# Patient Record
Sex: Female | Born: 1945
Health system: Southern US, Community
[De-identification: ages and names within clinical notes are randomized; demographics above are authoritative.]

## PROBLEM LIST (undated history)

## (undated) DIAGNOSIS — K5790 Diverticulosis of intestine, part unspecified, without perforation or abscess without bleeding: Secondary | ICD-10-CM

## (undated) DIAGNOSIS — M1990 Unspecified osteoarthritis, unspecified site: Secondary | ICD-10-CM

## (undated) DIAGNOSIS — I10 Essential (primary) hypertension: Secondary | ICD-10-CM

## (undated) DIAGNOSIS — E785 Hyperlipidemia, unspecified: Secondary | ICD-10-CM

## (undated) DIAGNOSIS — Z8719 Personal history of other diseases of the digestive system: Secondary | ICD-10-CM

## (undated) DIAGNOSIS — K219 Gastro-esophageal reflux disease without esophagitis: Secondary | ICD-10-CM

## (undated) DIAGNOSIS — R0600 Dyspnea, unspecified: Secondary | ICD-10-CM

## (undated) HISTORY — PX: CERVICAL DISC SURGERY: SHX588

## (undated) HISTORY — PX: ORBITAL FRACTURE SURGERY: SHX725

## (undated) HISTORY — PX: ABDOMINAL HYSTERECTOMY: SHX81

## (undated) HISTORY — DX: Essential (primary) hypertension: I10

## (undated) HISTORY — PX: DILATION AND CURETTAGE OF UTERUS: SHX78

## (undated) HISTORY — PX: EYE SURGERY: SHX253

## (undated) HISTORY — DX: Unspecified osteoarthritis, unspecified site: M19.90

---

## 1998-01-25 ENCOUNTER — Other Ambulatory Visit: Admission: RE | Admit: 1998-01-25 | Discharge: 1998-01-25 | Payer: Self-pay | Admitting: Obstetrics and Gynecology

## 1998-03-20 ENCOUNTER — Ambulatory Visit (HOSPITAL_COMMUNITY): Admission: RE | Admit: 1998-03-20 | Discharge: 1998-03-20 | Payer: Self-pay | Admitting: Obstetrics and Gynecology

## 1999-02-04 ENCOUNTER — Observation Stay (HOSPITAL_COMMUNITY): Admission: EM | Admit: 1999-02-04 | Discharge: 1999-02-05 | Payer: Self-pay | Admitting: Emergency Medicine

## 1999-02-04 ENCOUNTER — Encounter: Payer: Self-pay | Admitting: Gastroenterology

## 1999-02-05 ENCOUNTER — Encounter: Payer: Self-pay | Admitting: Gastroenterology

## 1999-03-27 ENCOUNTER — Encounter: Payer: Self-pay | Admitting: Gastroenterology

## 1999-03-27 ENCOUNTER — Ambulatory Visit (HOSPITAL_COMMUNITY): Admission: RE | Admit: 1999-03-27 | Discharge: 1999-03-27 | Payer: Self-pay | Admitting: Gastroenterology

## 1999-07-28 ENCOUNTER — Emergency Department (HOSPITAL_COMMUNITY): Admission: EM | Admit: 1999-07-28 | Discharge: 1999-07-28 | Payer: Self-pay | Admitting: Emergency Medicine

## 1999-07-28 ENCOUNTER — Encounter: Payer: Self-pay | Admitting: Emergency Medicine

## 2000-07-07 ENCOUNTER — Encounter: Payer: Self-pay | Admitting: Emergency Medicine

## 2000-07-07 ENCOUNTER — Emergency Department (HOSPITAL_COMMUNITY): Admission: EM | Admit: 2000-07-07 | Discharge: 2000-07-08 | Payer: Self-pay | Admitting: Emergency Medicine

## 2000-07-08 ENCOUNTER — Encounter: Payer: Self-pay | Admitting: Surgery

## 2000-07-08 ENCOUNTER — Ambulatory Visit (HOSPITAL_COMMUNITY): Admission: RE | Admit: 2000-07-08 | Discharge: 2000-07-08 | Payer: Self-pay | Admitting: Surgery

## 2000-10-15 ENCOUNTER — Other Ambulatory Visit: Admission: RE | Admit: 2000-10-15 | Discharge: 2000-10-15 | Payer: Self-pay | Admitting: Obstetrics and Gynecology

## 2000-12-21 ENCOUNTER — Encounter: Payer: Self-pay | Admitting: Gastroenterology

## 2002-11-15 ENCOUNTER — Other Ambulatory Visit: Admission: RE | Admit: 2002-11-15 | Discharge: 2002-11-15 | Payer: Self-pay | Admitting: Obstetrics and Gynecology

## 2003-01-04 ENCOUNTER — Ambulatory Visit (HOSPITAL_COMMUNITY): Admission: RE | Admit: 2003-01-04 | Discharge: 2003-01-04 | Payer: Self-pay | Admitting: Gastroenterology

## 2003-01-04 ENCOUNTER — Encounter: Payer: Self-pay | Admitting: Gastroenterology

## 2005-03-13 ENCOUNTER — Ambulatory Visit: Payer: Self-pay | Admitting: Gastroenterology

## 2005-03-20 ENCOUNTER — Ambulatory Visit (HOSPITAL_COMMUNITY): Admission: RE | Admit: 2005-03-20 | Discharge: 2005-03-20 | Payer: Self-pay | Admitting: Gastroenterology

## 2005-03-20 ENCOUNTER — Ambulatory Visit: Payer: Self-pay | Admitting: Gastroenterology

## 2005-09-02 ENCOUNTER — Other Ambulatory Visit: Admission: RE | Admit: 2005-09-02 | Discharge: 2005-09-02 | Payer: Self-pay | Admitting: Obstetrics and Gynecology

## 2006-11-05 ENCOUNTER — Other Ambulatory Visit: Admission: RE | Admit: 2006-11-05 | Discharge: 2006-11-05 | Payer: Self-pay | Admitting: Obstetrics and Gynecology

## 2006-11-09 ENCOUNTER — Ambulatory Visit (HOSPITAL_COMMUNITY): Admission: RE | Admit: 2006-11-09 | Discharge: 2006-11-09 | Payer: Self-pay | Admitting: Obstetrics and Gynecology

## 2007-10-10 ENCOUNTER — Emergency Department (HOSPITAL_COMMUNITY): Admission: EM | Admit: 2007-10-10 | Discharge: 2007-10-10 | Payer: Self-pay | Admitting: Emergency Medicine

## 2007-10-21 ENCOUNTER — Ambulatory Visit (HOSPITAL_BASED_OUTPATIENT_CLINIC_OR_DEPARTMENT_OTHER): Admission: RE | Admit: 2007-10-21 | Discharge: 2007-10-21 | Payer: Self-pay | Admitting: Otolaryngology

## 2007-11-11 ENCOUNTER — Other Ambulatory Visit: Admission: RE | Admit: 2007-11-11 | Discharge: 2007-11-11 | Payer: Self-pay | Admitting: Obstetrics and Gynecology

## 2009-03-29 ENCOUNTER — Encounter: Payer: Self-pay | Admitting: Obstetrics and Gynecology

## 2009-03-29 ENCOUNTER — Other Ambulatory Visit: Admission: RE | Admit: 2009-03-29 | Discharge: 2009-03-29 | Payer: Self-pay | Admitting: Obstetrics and Gynecology

## 2009-03-29 ENCOUNTER — Ambulatory Visit: Payer: Self-pay | Admitting: Obstetrics and Gynecology

## 2009-03-29 ENCOUNTER — Telehealth: Payer: Self-pay | Admitting: Gastroenterology

## 2009-03-29 DIAGNOSIS — K222 Esophageal obstruction: Secondary | ICD-10-CM

## 2009-03-29 DIAGNOSIS — R1310 Dysphagia, unspecified: Secondary | ICD-10-CM | POA: Insufficient documentation

## 2009-03-29 DIAGNOSIS — K449 Diaphragmatic hernia without obstruction or gangrene: Secondary | ICD-10-CM | POA: Insufficient documentation

## 2009-03-30 ENCOUNTER — Ambulatory Visit: Payer: Self-pay | Admitting: Gastroenterology

## 2009-03-30 ENCOUNTER — Encounter: Admission: RE | Admit: 2009-03-30 | Discharge: 2009-03-30 | Payer: Self-pay | Admitting: Obstetrics and Gynecology

## 2009-03-30 DIAGNOSIS — K219 Gastro-esophageal reflux disease without esophagitis: Secondary | ICD-10-CM

## 2009-03-30 DIAGNOSIS — R1319 Other dysphagia: Secondary | ICD-10-CM | POA: Insufficient documentation

## 2009-03-30 DIAGNOSIS — K5730 Diverticulosis of large intestine without perforation or abscess without bleeding: Secondary | ICD-10-CM | POA: Insufficient documentation

## 2009-03-30 DIAGNOSIS — I10 Essential (primary) hypertension: Secondary | ICD-10-CM

## 2009-04-02 ENCOUNTER — Ambulatory Visit: Payer: Self-pay | Admitting: Obstetrics and Gynecology

## 2009-04-04 ENCOUNTER — Encounter: Payer: Self-pay | Admitting: Gastroenterology

## 2009-04-04 ENCOUNTER — Ambulatory Visit: Payer: Self-pay | Admitting: Gastroenterology

## 2009-04-06 ENCOUNTER — Encounter: Payer: Self-pay | Admitting: Gastroenterology

## 2010-03-14 ENCOUNTER — Ambulatory Visit: Payer: Self-pay | Admitting: Vascular Surgery

## 2010-04-01 ENCOUNTER — Encounter: Admission: RE | Admit: 2010-04-01 | Discharge: 2010-04-01 | Payer: Self-pay | Admitting: Obstetrics and Gynecology

## 2010-04-02 ENCOUNTER — Other Ambulatory Visit: Admission: RE | Admit: 2010-04-02 | Discharge: 2010-04-02 | Payer: Self-pay | Admitting: Obstetrics and Gynecology

## 2010-04-02 ENCOUNTER — Ambulatory Visit: Payer: Self-pay | Admitting: Obstetrics and Gynecology

## 2010-09-15 ENCOUNTER — Encounter: Payer: Self-pay | Admitting: Gastroenterology
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
## 2011-01-07 NOTE — Procedures (Signed)
DUPLEX DEEP VENOUS EXAM - LOWER EXTREMITY   INDICATION:  Left lower extremity swelling.   HISTORY:  Edema:  Three months.  Trauma/Surgery:  No.  Pain:  No.  PE:  No.  Previous DVT:  No.  Anticoagulants:  No.  Other:   DUPLEX EXAM:                CFV   SFV   PopV  PTV    GSV                R  L  R  L  R  L  R   L  R  L  Thrombosis    o  o     o     o      o     o  Spontaneous   +  +     +     +      +     +  Phasic        +  +     +     +      +     +  Augmentation  +  +     +     +      +     +  Compressible  +  +     +     +      +     +  Competent     +  +     +     +      +     +   Legend:  + - yes  o - no  p - partial  D - decreased   IMPRESSION:  No evidence of deep or superficial venous thrombosis in the  left lower extremity or right common femoral vein.   Report faxed due to office closed for lunch.    _____________________________  Di Kindle. Edilia Bo, M.D.   AS/MEDQ  D:  03/14/2010  T:  03/14/2010  Job:  [id number]

## 2011-05-16 LAB — BASIC METABOLIC PANEL
CO2: 27
Chloride: 108
GFR calc non Af Amer: >60
Glucose, Bld: 98
Potassium: 4.5
Sodium: 142

## 2011-08-12 ENCOUNTER — Other Ambulatory Visit: Payer: Self-pay | Admitting: Obstetrics and Gynecology

## 2011-08-12 DIAGNOSIS — Z1231 Encounter for screening mammogram for malignant neoplasm of breast: Secondary | ICD-10-CM

## 2011-09-19 DIAGNOSIS — H2510 Age-related nuclear cataract, unspecified eye: Secondary | ICD-10-CM | POA: Diagnosis not present

## 2011-10-06 DIAGNOSIS — R05 Cough: Secondary | ICD-10-CM | POA: Diagnosis not present

## 2011-10-06 DIAGNOSIS — J0100 Acute maxillary sinusitis, unspecified: Secondary | ICD-10-CM | POA: Diagnosis not present

## 2011-10-06 DIAGNOSIS — H6690 Otitis media, unspecified, unspecified ear: Secondary | ICD-10-CM | POA: Diagnosis not present

## 2011-10-06 DIAGNOSIS — I10 Essential (primary) hypertension: Secondary | ICD-10-CM | POA: Diagnosis not present

## 2011-10-23 ENCOUNTER — Encounter: Payer: Self-pay | Admitting: Gynecology

## 2011-10-23 ENCOUNTER — Ambulatory Visit
Admission: RE | Admit: 2011-10-23 | Discharge: 2011-10-23 | Disposition: A | Discharge status: Home / Self Care | Payer: Medicare Other | Source: Ambulatory Visit | Attending: Obstetrics and Gynecology | Admitting: Obstetrics and Gynecology

## 2011-10-23 DIAGNOSIS — I10 Essential (primary) hypertension: Secondary | ICD-10-CM | POA: Insufficient documentation

## 2011-10-23 DIAGNOSIS — Z1231 Encounter for screening mammogram for malignant neoplasm of breast: Secondary | ICD-10-CM

## 2011-10-24 DIAGNOSIS — M47814 Spondylosis without myelopathy or radiculopathy, thoracic region: Secondary | ICD-10-CM | POA: Diagnosis not present

## 2011-10-24 DIAGNOSIS — M1990 Unspecified osteoarthritis, unspecified site: Secondary | ICD-10-CM | POA: Diagnosis not present

## 2011-10-24 DIAGNOSIS — M546 Pain in thoracic spine: Secondary | ICD-10-CM | POA: Diagnosis not present

## 2011-10-24 DIAGNOSIS — R109 Unspecified abdominal pain: Secondary | ICD-10-CM | POA: Diagnosis not present

## 2011-10-31 ENCOUNTER — Ambulatory Visit (INDEPENDENT_AMBULATORY_CARE_PROVIDER_SITE_OTHER): Payer: Medicare Other | Admitting: Obstetrics and Gynecology

## 2011-10-31 ENCOUNTER — Other Ambulatory Visit (HOSPITAL_COMMUNITY)
Admission: RE | Admit: 2011-10-31 | Discharge: 2011-10-31 | Disposition: A | Discharge status: Home / Self Care | Payer: Medicare Other | Source: Ambulatory Visit | Attending: Obstetrics and Gynecology | Admitting: Obstetrics and Gynecology

## 2011-10-31 ENCOUNTER — Encounter: Payer: Self-pay | Admitting: Obstetrics and Gynecology

## 2011-10-31 VITALS — BP 120/70 | Ht 65.5 in | Wt 213.0 lb

## 2011-10-31 DIAGNOSIS — B373 Candidiasis of vulva and vagina: Secondary | ICD-10-CM | POA: Diagnosis not present

## 2011-10-31 DIAGNOSIS — N393 Stress incontinence (female) (male): Secondary | ICD-10-CM | POA: Diagnosis not present

## 2011-10-31 DIAGNOSIS — Z124 Encounter for screening for malignant neoplasm of cervix: Secondary | ICD-10-CM

## 2011-10-31 DIAGNOSIS — N952 Postmenopausal atrophic vaginitis: Secondary | ICD-10-CM | POA: Diagnosis not present

## 2011-10-31 DIAGNOSIS — M1990 Unspecified osteoarthritis, unspecified site: Secondary | ICD-10-CM | POA: Insufficient documentation

## 2011-10-31 NOTE — Progress Notes (Signed)
Patient came back to see me today for further followup. The first thing we discussed was her urinary stress incontinence. She was ready to have a sling procedure but has worked with a physical therapist at the urology group and is seen thousand percent improvement. She still will occasionally have some incontinence but only really when she's been sick with severe coughing. She is doing well without hormone replacement. She does have atrophic vaginitis but is not sexually active and does not feel it needs intervention. She is having no vaginal bleeding. She is having no pelvic pain. She just had a normal mammogram. She's had 2 normal bone densities. She does get recurrent yeast infections but currently is asymptomatic. She does her lab through her PCP.  ROS: 12 system review done. Pertinent positives above. Other positives include hypertension, constipation, esophageal stricture with reflux, hiatal hernia, musculoskeletal pain with limitation of motion from arthritis.  Physical examination:  Beola Cord present. HEENT within normal limits. Neck: Thyroid not large. No masses. Supraclavicular nodes: not enlarged. Breasts: Examined in both sitting midline position. No skin changes and no masses. Abdomen: Soft no guarding rebound or masses or hernia. Pelvic: External: Within normal limits. BUS: Within normal limits. Vaginal:within normal limits. Poor estrogen effect. No evidence of cystocele rectocele or enterocele. Cervix: clean. Uterus: Normal size and shape. Adnexa: No masses. Rectovaginal exam: Confirmatory and negative. Extremities: Within normal limits.  Assessment: #1. Urinary stress incontinence much improved #2. Atrophic vaginitis asymptomatic #3. Yeast vaginitis asymptomatic  Plan: Discussed all the above in detail. Continue physical therapy exercises. Continue yearly mammograms.

## 2011-11-18 ENCOUNTER — Other Ambulatory Visit (HOSPITAL_COMMUNITY): Payer: Self-pay | Admitting: Internal Medicine

## 2011-11-18 DIAGNOSIS — I10 Essential (primary) hypertension: Secondary | ICD-10-CM | POA: Diagnosis not present

## 2011-11-18 DIAGNOSIS — K449 Diaphragmatic hernia without obstruction or gangrene: Secondary | ICD-10-CM | POA: Diagnosis not present

## 2011-11-18 DIAGNOSIS — R05 Cough: Secondary | ICD-10-CM | POA: Diagnosis not present

## 2011-11-18 DIAGNOSIS — M546 Pain in thoracic spine: Secondary | ICD-10-CM | POA: Diagnosis not present

## 2011-11-19 ENCOUNTER — Ambulatory Visit (HOSPITAL_COMMUNITY)
Admission: RE | Admit: 2011-11-19 | Discharge: 2011-11-19 | Disposition: A | Discharge status: Home / Self Care | Payer: Medicare Other | Source: Ambulatory Visit | Attending: Internal Medicine | Admitting: Internal Medicine

## 2011-11-19 DIAGNOSIS — R059 Cough, unspecified: Secondary | ICD-10-CM | POA: Diagnosis not present

## 2011-11-19 DIAGNOSIS — R05 Cough: Secondary | ICD-10-CM | POA: Diagnosis not present

## 2011-11-19 MED ORDER — ALBUTEROL SULFATE (5 MG/ML) 0.5% IN NEBU
2.5000 mg | INHALATION_SOLUTION | Freq: Once | RESPIRATORY_TRACT | Status: AC
  Administered 2011-11-19: 2.5 mg via RESPIRATORY_TRACT

## 2012-07-06 DIAGNOSIS — I10 Essential (primary) hypertension: Secondary | ICD-10-CM | POA: Diagnosis not present

## 2012-07-06 DIAGNOSIS — E785 Hyperlipidemia, unspecified: Secondary | ICD-10-CM | POA: Diagnosis not present

## 2012-07-12 DIAGNOSIS — Z0000 Encounter for general adult medical examination without abnormal findings: Secondary | ICD-10-CM | POA: Diagnosis not present

## 2012-07-12 DIAGNOSIS — Z1331 Encounter for screening for depression: Secondary | ICD-10-CM | POA: Diagnosis not present

## 2012-07-12 DIAGNOSIS — M1990 Unspecified osteoarthritis, unspecified site: Secondary | ICD-10-CM | POA: Diagnosis not present

## 2012-07-12 DIAGNOSIS — K449 Diaphragmatic hernia without obstruction or gangrene: Secondary | ICD-10-CM | POA: Diagnosis not present

## 2012-07-12 DIAGNOSIS — Z23 Encounter for immunization: Secondary | ICD-10-CM | POA: Diagnosis not present

## 2012-07-15 DIAGNOSIS — M25559 Pain in unspecified hip: Secondary | ICD-10-CM | POA: Diagnosis not present

## 2012-07-15 DIAGNOSIS — M999 Biomechanical lesion, unspecified: Secondary | ICD-10-CM | POA: Diagnosis not present

## 2012-07-15 DIAGNOSIS — M5137 Other intervertebral disc degeneration, lumbosacral region: Secondary | ICD-10-CM | POA: Diagnosis not present

## 2012-07-26 DIAGNOSIS — M25559 Pain in unspecified hip: Secondary | ICD-10-CM | POA: Diagnosis not present

## 2012-07-26 DIAGNOSIS — M999 Biomechanical lesion, unspecified: Secondary | ICD-10-CM | POA: Diagnosis not present

## 2012-07-26 DIAGNOSIS — M5137 Other intervertebral disc degeneration, lumbosacral region: Secondary | ICD-10-CM | POA: Diagnosis not present

## 2012-07-28 DIAGNOSIS — M25559 Pain in unspecified hip: Secondary | ICD-10-CM | POA: Diagnosis not present

## 2012-07-28 DIAGNOSIS — M5137 Other intervertebral disc degeneration, lumbosacral region: Secondary | ICD-10-CM | POA: Diagnosis not present

## 2012-07-28 DIAGNOSIS — M999 Biomechanical lesion, unspecified: Secondary | ICD-10-CM | POA: Diagnosis not present

## 2012-07-30 DIAGNOSIS — M25559 Pain in unspecified hip: Secondary | ICD-10-CM | POA: Diagnosis not present

## 2012-07-30 DIAGNOSIS — M999 Biomechanical lesion, unspecified: Secondary | ICD-10-CM | POA: Diagnosis not present

## 2012-07-30 DIAGNOSIS — M5137 Other intervertebral disc degeneration, lumbosacral region: Secondary | ICD-10-CM | POA: Diagnosis not present

## 2012-08-02 DIAGNOSIS — M999 Biomechanical lesion, unspecified: Secondary | ICD-10-CM | POA: Diagnosis not present

## 2012-08-02 DIAGNOSIS — M25559 Pain in unspecified hip: Secondary | ICD-10-CM | POA: Diagnosis not present

## 2012-08-02 DIAGNOSIS — M5137 Other intervertebral disc degeneration, lumbosacral region: Secondary | ICD-10-CM | POA: Diagnosis not present

## 2012-08-03 DIAGNOSIS — M5137 Other intervertebral disc degeneration, lumbosacral region: Secondary | ICD-10-CM | POA: Diagnosis not present

## 2012-08-03 DIAGNOSIS — M999 Biomechanical lesion, unspecified: Secondary | ICD-10-CM | POA: Diagnosis not present

## 2012-08-03 DIAGNOSIS — M25559 Pain in unspecified hip: Secondary | ICD-10-CM | POA: Diagnosis not present

## 2012-08-10 DIAGNOSIS — M999 Biomechanical lesion, unspecified: Secondary | ICD-10-CM | POA: Diagnosis not present

## 2012-08-10 DIAGNOSIS — M25559 Pain in unspecified hip: Secondary | ICD-10-CM | POA: Diagnosis not present

## 2012-08-10 DIAGNOSIS — M5137 Other intervertebral disc degeneration, lumbosacral region: Secondary | ICD-10-CM | POA: Diagnosis not present

## 2012-08-12 DIAGNOSIS — M25559 Pain in unspecified hip: Secondary | ICD-10-CM | POA: Diagnosis not present

## 2012-08-12 DIAGNOSIS — M999 Biomechanical lesion, unspecified: Secondary | ICD-10-CM | POA: Diagnosis not present

## 2012-08-12 DIAGNOSIS — M5137 Other intervertebral disc degeneration, lumbosacral region: Secondary | ICD-10-CM | POA: Diagnosis not present

## 2012-10-18 DIAGNOSIS — H268 Other specified cataract: Secondary | ICD-10-CM | POA: Diagnosis not present

## 2012-10-18 DIAGNOSIS — H526 Other disorders of refraction: Secondary | ICD-10-CM | POA: Diagnosis not present

## 2012-10-18 DIAGNOSIS — H2510 Age-related nuclear cataract, unspecified eye: Secondary | ICD-10-CM | POA: Diagnosis not present

## 2012-10-18 DIAGNOSIS — D3130 Benign neoplasm of unspecified choroid: Secondary | ICD-10-CM | POA: Diagnosis not present

## 2013-06-15 DIAGNOSIS — H2510 Age-related nuclear cataract, unspecified eye: Secondary | ICD-10-CM | POA: Diagnosis not present

## 2013-06-15 DIAGNOSIS — H11429 Conjunctival edema, unspecified eye: Secondary | ICD-10-CM | POA: Diagnosis not present

## 2013-06-15 DIAGNOSIS — H18419 Arcus senilis, unspecified eye: Secondary | ICD-10-CM | POA: Diagnosis not present

## 2013-06-15 DIAGNOSIS — S0291XS Unspecified fracture of skull, sequela: Secondary | ICD-10-CM | POA: Diagnosis not present

## 2013-06-21 DIAGNOSIS — Z9889 Other specified postprocedural states: Secondary | ICD-10-CM | POA: Diagnosis not present

## 2013-06-21 DIAGNOSIS — Z981 Arthrodesis status: Secondary | ICD-10-CM | POA: Diagnosis not present

## 2013-06-21 DIAGNOSIS — I10 Essential (primary) hypertension: Secondary | ICD-10-CM | POA: Diagnosis not present

## 2013-06-21 DIAGNOSIS — Z886 Allergy status to analgesic agent status: Secondary | ICD-10-CM | POA: Diagnosis not present

## 2013-06-21 DIAGNOSIS — H2510 Age-related nuclear cataract, unspecified eye: Secondary | ICD-10-CM | POA: Diagnosis not present

## 2013-06-21 DIAGNOSIS — H2589 Other age-related cataract: Secondary | ICD-10-CM | POA: Diagnosis not present

## 2013-07-07 DIAGNOSIS — Z9889 Other specified postprocedural states: Secondary | ICD-10-CM | POA: Diagnosis not present

## 2013-07-07 DIAGNOSIS — Z888 Allergy status to other drugs, medicaments and biological substances status: Secondary | ICD-10-CM | POA: Diagnosis not present

## 2013-07-07 DIAGNOSIS — H2589 Other age-related cataract: Secondary | ICD-10-CM | POA: Diagnosis not present

## 2013-07-07 DIAGNOSIS — Z981 Arthrodesis status: Secondary | ICD-10-CM | POA: Diagnosis not present

## 2013-07-07 DIAGNOSIS — I498 Other specified cardiac arrhythmias: Secondary | ICD-10-CM | POA: Diagnosis not present

## 2013-07-07 DIAGNOSIS — I10 Essential (primary) hypertension: Secondary | ICD-10-CM | POA: Diagnosis not present

## 2013-07-15 DIAGNOSIS — D3130 Benign neoplasm of unspecified choroid: Secondary | ICD-10-CM | POA: Diagnosis not present

## 2013-07-26 DIAGNOSIS — Z23 Encounter for immunization: Secondary | ICD-10-CM | POA: Diagnosis not present

## 2013-07-26 DIAGNOSIS — E785 Hyperlipidemia, unspecified: Secondary | ICD-10-CM | POA: Diagnosis not present

## 2013-07-26 DIAGNOSIS — R82998 Other abnormal findings in urine: Secondary | ICD-10-CM | POA: Diagnosis not present

## 2013-07-26 DIAGNOSIS — I10 Essential (primary) hypertension: Secondary | ICD-10-CM | POA: Diagnosis not present

## 2013-07-28 ENCOUNTER — Encounter (HOSPITAL_COMMUNITY): Payer: Self-pay | Admitting: Emergency Medicine

## 2013-07-28 ENCOUNTER — Emergency Department (HOSPITAL_COMMUNITY)
Admission: EM | Admit: 2013-07-28 | Discharge: 2013-07-29 | Disposition: A | Discharge status: Home / Self Care | Payer: Medicare Other | Attending: Emergency Medicine | Admitting: Emergency Medicine

## 2013-07-28 ENCOUNTER — Emergency Department (HOSPITAL_COMMUNITY): Payer: Medicare Other

## 2013-07-28 DIAGNOSIS — Z791 Long term (current) use of non-steroidal anti-inflammatories (NSAID): Secondary | ICD-10-CM | POA: Insufficient documentation

## 2013-07-28 DIAGNOSIS — Z8739 Personal history of other diseases of the musculoskeletal system and connective tissue: Secondary | ICD-10-CM | POA: Insufficient documentation

## 2013-07-28 DIAGNOSIS — M545 Low back pain, unspecified: Secondary | ICD-10-CM | POA: Diagnosis not present

## 2013-07-28 DIAGNOSIS — Z9889 Other specified postprocedural states: Secondary | ICD-10-CM | POA: Insufficient documentation

## 2013-07-28 DIAGNOSIS — Z79899 Other long term (current) drug therapy: Secondary | ICD-10-CM | POA: Insufficient documentation

## 2013-07-28 DIAGNOSIS — R109 Unspecified abdominal pain: Secondary | ICD-10-CM | POA: Diagnosis not present

## 2013-07-28 DIAGNOSIS — I10 Essential (primary) hypertension: Secondary | ICD-10-CM | POA: Diagnosis not present

## 2013-07-28 LAB — CBC WITH DIFFERENTIAL/PLATELET
Eosinophils Relative: 6 % — ABNORMAL HIGH (ref 0–5)
Lymphocytes Relative: 29 % (ref 12–46)
Lymphs Abs: 2.3 10*3/uL (ref 0.7–4.0)
MCV: 89.3 fL (ref 78.0–100.0)
Neutrophils Relative %: 55 % (ref 43–77)
Platelets: 221 10*3/uL (ref 150–400)
RBC: 4.3 MIL/uL (ref 3.87–5.11)
WBC: 8.1 10*3/uL (ref 4.0–10.5)

## 2013-07-28 LAB — URINALYSIS, ROUTINE W REFLEX MICROSCOPIC
Glucose, UA: NEGATIVE mg/dL
Hgb urine dipstick: NEGATIVE
Specific Gravity, Urine: 1.019 (ref 1.005–1.030)
Urobilinogen, UA: 0.2 mg/dL (ref 0.0–1.0)

## 2013-07-28 LAB — COMPREHENSIVE METABOLIC PANEL
ALT: 31 U/L (ref 0–35)
Alkaline Phosphatase: 70 U/L (ref 39–117)
CO2: 22 mEq/L (ref 19–32)
Chloride: 101 mEq/L (ref 96–112)
GFR calc Af Amer: 75 mL/min — ABNORMAL LOW (ref >90)
GFR calc non Af Amer: 65 mL/min — ABNORMAL LOW (ref >90)
Glucose, Bld: 96 mg/dL (ref 70–99)
Potassium: 3.7 mEq/L (ref 3.5–5.1)
Sodium: 136 mEq/L (ref 135–145)
Total Bilirubin: 0.3 mg/dL (ref 0.3–1.2)

## 2013-07-28 MED ORDER — ONDANSETRON HCL 4 MG/2ML IJ SOLN
4.0000 mg | Freq: Once | INTRAMUSCULAR | Status: AC
  Administered 2013-07-28: 4 mg via INTRAVENOUS
  Filled 2013-07-28: qty 2

## 2013-07-28 MED ORDER — FENTANYL CITRATE 0.05 MG/ML IJ SOLN
100.0000 ug | Freq: Once | INTRAMUSCULAR | Status: AC
  Administered 2013-07-28: 100 ug via INTRAVENOUS
  Filled 2013-07-28: qty 2

## 2013-07-28 NOTE — ED Provider Notes (Signed)
CSN: [id number]     Arrival date & time 07/28/13  1824 History   First MD Initiated Contact with Patient 07/28/13 2157     Chief Complaint  Patient presents with  . Flank Pain   (Consider location/radiation/quality/duration/timing/severity/associated sxs/prior Treatment) HPI Patient presents to the ED with a chief complaint of lower left back and flank pain. Patient states that the pain began Tuesday night/Wednesday morning. The pain does not radiate. She states that the pain comes and goes, but at it's worst, it is a 10/10. Patient has tried flexeril, norco, icy hot, and heat/ice without relief. Moving around makes the pain worse. Patient denies fever, chills, nausea, vomiting, abdominal pain, chest pain, hematuria, urinary frequency, and dysuria. She denies any history of kidney stones.   Past Medical History  Diagnosis Date  . Hypertension   . Arthritis    Past Surgical History  Procedure Laterality Date  . Dilation and curettage of uterus    . Orbital fracture surgery    . Cervical disc surgery    . Eye surgery     Family History  Problem Relation Age of Onset  . Heart disease Mother   . Cancer Mother     colon  . Hypertension Father   . Heart disease Father   . Lung cancer Father   . Cancer Father     lung   History  Substance Use Topics  . Smoking status: Never Smoker   . Smokeless tobacco: Never Used  . Alcohol Use: Yes     Comment: daily to weekly   OB History   Grav Para Term Preterm Abortions TAB SAB Ect Mult Living   2 2 2       2      Review of Systems  Allergies  Morphine  Home Medications   Current Outpatient Rx  Name  Route  Sig  Dispense  Refill  . diclofenac (VOLTAREN) 50 MG EC tablet   Oral   Take 50 mg by mouth daily.         . hydrochlorothiazide (HYDRODIURIL) 25 MG tablet   Oral   Take 25 mg by mouth daily.         . Nebivolol HCl (BYSTOLIC PO)   Oral   Take 20 mg by mouth.          [name] omeprazole (PRILOSEC) 20 MG capsule  Oral   Take 20 mg by mouth daily.         . Red Yeast Rice 600 MG TABS   Oral   Take 600 mg by mouth daily.          BP 171/50  Pulse 58  Temp(Src) 98 F (36.7 C) (Oral)  Resp 16  Wt 213 lb (96.616 kg)  SpO2 99% Physical Exam  Nursing note and vitals reviewed. Constitutional: She is oriented to person, place, and time. She appears well-developed and well-nourished. No distress.  HENT:  Head: Normocephalic and atraumatic.  Cardiovascular: Normal rate, regular rhythm and normal heart sounds.  Exam reveals no gallop and no friction rub.   No murmur heard. Pulmonary/Chest: Effort normal. No respiratory distress. She has no wheezes.  Patient has crackles in both lung bases, which she states is chronic  Abdominal: Soft. Bowel sounds are normal. She exhibits no distension. There is no tenderness. There is no rebound and no guarding.  Musculoskeletal:       Arms: Neurological: She is alert and oriented to person, place, and time.  Skin: Skin is  warm and dry.    ED Course  Procedures (including critical care time) Labs Review Labs Reviewed  CBC WITH DIFFERENTIAL - Abnormal; Notable for the following:    Eosinophils Relative 6 (*)    All other components within normal limits  COMPREHENSIVE METABOLIC PANEL - Abnormal; Notable for the following:    GFR calc non Af Amer 65 (*)    GFR calc Af Amer 75 (*)    All other components within normal limits  URINALYSIS, ROUTINE W REFLEX MICROSCOPIC   Imaging Review Ct Abdomen Pelvis Wo Contrast  07/28/2013   CLINICAL DATA:  Left low back and flank pain for 2 days. No history of renal stones. History of esophageal stricture. Hiatal hernia.  EXAM: CT ABDOMEN AND PELVIS WITHOUT CONTRAST  TECHNIQUE: Multidetector CT imaging of the abdomen and pelvis was performed following the standard protocol without intravenous contrast.  COMPARISON:  None.  FINDINGS: Lower Chest: Mild scarring at the lung bases with nonspecific interstitial thickening.  Cardiomegaly, accentuated by a pectus excavatum deformity.  Small hiatal hernia No pericardial or pleural effusion.  Abdomen/Pelvis: Normal infused appearance of the liver, spleen, distal stomach, pancreas, gallbladder, biliary tract, adrenal glands.  No renal calculi or hydronephrosis. No hydroureter or ureteric calculi.  Aortic and branch vessel atherosclerosis. No retroperitoneal or retrocrural adenopathy. Scattered colonic diverticula. Normal terminal ileum and appendix. Normal small bowel without abdominal ascites. No pelvic adenopathy. Normal urinary bladder. Irregular uterine contour, suggesting fibroids. No adnexal mass or significant free fluid.  Fat containing umbilical hernia.  Bones/Musculoskeletal: Advanced degenerative disc disease, especially at T12-L1.  IMPRESSION: 1.  No urinary tract calculi or hydronephrosis. 2. Probable uterine fibroids. 3. Advanced spondylosis and degenerative disc disease.   Electronically Signed   By: Jeronimo Greaves M.D.   On: 07/28/2013 23:29   Patient be referred back to her primary care, Dr. she's not had any neurological deficits noted on exam.  Patient does not have any rashes noted to the skin.  She is advised this is most likely musculoskeletal in nature.  The CT scan and Her lab tests did not indicate any abnormalities .  The patient is advised to return here for any worsening in her condition patient is advised of her laboratory results, and all questions were answered  Carlyle Dolly, PA-C 07/29/13 [phone]

## 2013-07-29 ENCOUNTER — Other Ambulatory Visit (HOSPITAL_COMMUNITY): Payer: Self-pay | Admitting: Specialist

## 2013-07-29 DIAGNOSIS — M545 Low back pain: Secondary | ICD-10-CM | POA: Diagnosis not present

## 2013-07-29 DIAGNOSIS — M62830 Muscle spasm of back: Secondary | ICD-10-CM

## 2013-07-29 MED ORDER — IBUPROFEN 800 MG PO TABS: 800.0000 mg | ORAL_TABLET | Freq: Three times a day (TID) | ORAL | Status: DC | PRN

## 2013-07-29 MED ORDER — ONDANSETRON HCL 4 MG/2ML IJ SOLN
4.0000 mg | Freq: Once | INTRAMUSCULAR | Status: AC
  Administered 2013-07-29: 4 mg via INTRAVENOUS
  Filled 2013-07-29: qty 2

## 2013-07-29 MED ORDER — KETOROLAC TROMETHAMINE 30 MG/ML IJ SOLN
30.0000 mg | Freq: Once | INTRAMUSCULAR | Status: AC
  Administered 2013-07-29: 30 mg via INTRAVENOUS
  Filled 2013-07-29: qty 1

## 2013-07-29 MED ORDER — OXYCODONE-ACETAMINOPHEN 5-325 MG PO TABS: 1.0000 | ORAL_TABLET | Freq: Four times a day (QID) | ORAL | Status: DC | PRN

## 2013-07-29 MED ORDER — HYDROMORPHONE HCL PF 1 MG/ML IJ SOLN
1.0000 mg | Freq: Once | INTRAMUSCULAR | Status: AC
  Administered 2013-07-29: 1 mg via INTRAVENOUS
  Filled 2013-07-29: qty 1

## 2013-07-31 NOTE — ED Provider Notes (Signed)
Medical screening examination/treatment/procedure(s) were performed by non-physician practitioner and as supervising physician I was immediately available for consultation/collaboration.  EKG Interpretation   None        Berle Fitz R. Hollace Michelli, MD 07/31/13 1046 

## 2013-08-01 ENCOUNTER — Ambulatory Visit (HOSPITAL_COMMUNITY)
Admission: RE | Admit: 2013-08-01 | Discharge: 2013-08-01 | Disposition: A | Discharge status: Home / Self Care | Payer: Medicare Other | Source: Ambulatory Visit | Attending: Specialist | Admitting: Specialist

## 2013-08-01 ENCOUNTER — Other Ambulatory Visit (HOSPITAL_COMMUNITY): Payer: Self-pay | Admitting: Specialist

## 2013-08-01 DIAGNOSIS — IMO0002 Reserved for concepts with insufficient information to code with codable children: Secondary | ICD-10-CM | POA: Insufficient documentation

## 2013-08-01 DIAGNOSIS — E785 Hyperlipidemia, unspecified: Secondary | ICD-10-CM | POA: Diagnosis not present

## 2013-08-01 DIAGNOSIS — R1310 Dysphagia, unspecified: Secondary | ICD-10-CM | POA: Diagnosis not present

## 2013-08-01 DIAGNOSIS — M48061 Spinal stenosis, lumbar region without neurogenic claudication: Secondary | ICD-10-CM | POA: Diagnosis not present

## 2013-08-01 DIAGNOSIS — M5137 Other intervertebral disc degeneration, lumbosacral region: Secondary | ICD-10-CM | POA: Diagnosis not present

## 2013-08-01 DIAGNOSIS — Z1389 Encounter for screening for other disorder: Secondary | ICD-10-CM | POA: Insufficient documentation

## 2013-08-01 DIAGNOSIS — K5900 Constipation, unspecified: Secondary | ICD-10-CM | POA: Diagnosis not present

## 2013-08-01 DIAGNOSIS — M546 Pain in thoracic spine: Secondary | ICD-10-CM | POA: Diagnosis not present

## 2013-08-01 DIAGNOSIS — Z135 Encounter for screening for eye and ear disorders: Secondary | ICD-10-CM | POA: Diagnosis not present

## 2013-08-01 DIAGNOSIS — I700 Atherosclerosis of aorta: Secondary | ICD-10-CM | POA: Diagnosis not present

## 2013-08-01 DIAGNOSIS — Z1331 Encounter for screening for depression: Secondary | ICD-10-CM | POA: Diagnosis not present

## 2013-08-01 DIAGNOSIS — M5126 Other intervertebral disc displacement, lumbar region: Secondary | ICD-10-CM | POA: Diagnosis not present

## 2013-08-01 DIAGNOSIS — E669 Obesity, unspecified: Secondary | ICD-10-CM | POA: Diagnosis not present

## 2013-08-01 DIAGNOSIS — M62830 Muscle spasm of back: Secondary | ICD-10-CM

## 2013-08-01 DIAGNOSIS — Z0000 Encounter for general adult medical examination without abnormal findings: Secondary | ICD-10-CM | POA: Diagnosis not present

## 2013-08-01 DIAGNOSIS — M1990 Unspecified osteoarthritis, unspecified site: Secondary | ICD-10-CM | POA: Diagnosis not present

## 2013-08-05 DIAGNOSIS — M2550 Pain in unspecified joint: Secondary | ICD-10-CM | POA: Diagnosis not present

## 2013-08-05 DIAGNOSIS — M545 Low back pain: Secondary | ICD-10-CM | POA: Diagnosis not present

## 2013-08-10 DIAGNOSIS — M545 Low back pain: Secondary | ICD-10-CM | POA: Diagnosis not present

## 2013-09-12 ENCOUNTER — Telehealth: Payer: Self-pay | Admitting: Gastroenterology

## 2013-09-12 NOTE — Telephone Encounter (Signed)
Pt with Recall COLON due in August; last done [facility] 04/04/09. COLON normal accept severe diverticulosis; EGD with [facility] Dilation. Pt states there's nothing wrong COLON wise, but she is having trouble swallowing and feels she needs stretching; states she usually has both procedures done at the same time. Dr Sharlett Iles, [place] to order Direct ECL? Thanks.

## 2013-09-12 NOTE — Telephone Encounter (Signed)
ok 

## 2013-09-13 NOTE — Telephone Encounter (Signed)
no

## 2013-09-13 NOTE — Telephone Encounter (Signed)
Informed pt she does not have to stop Voltaren; pt stated understanding.

## 2013-09-13 NOTE — Telephone Encounter (Signed)
Scheduled pt for PV on 09/15/13 and her ECL on 09/19/13. Dr Sharlett Iles, pt is on Voltaren and states she has been instructed to stop it for procedures; does she need to stop for her ECL? Thanks.

## 2013-09-14 DIAGNOSIS — M47814 Spondylosis without myelopathy or radiculopathy, thoracic region: Secondary | ICD-10-CM | POA: Diagnosis not present

## 2013-09-15 ENCOUNTER — Ambulatory Visit (AMBULATORY_SURGERY_CENTER): Payer: Self-pay | Admitting: *Deleted

## 2013-09-15 VITALS — Ht 66.0 in | Wt 218.4 lb

## 2013-09-15 DIAGNOSIS — Z1211 Encounter for screening for malignant neoplasm of colon: Secondary | ICD-10-CM

## 2013-09-15 DIAGNOSIS — R1310 Dysphagia, unspecified: Secondary | ICD-10-CM

## 2013-09-15 DIAGNOSIS — Z800 Family history of malignant neoplasm of digestive organs: Secondary | ICD-10-CM

## 2013-09-15 MED ORDER — MOVIPREP 100 G PO SOLR: 1.0000 | Freq: Once | ORAL | Status: DC

## 2013-09-15 NOTE — Progress Notes (Signed)
Denies allergies to eggs or soy products. Denies complications with sedation or anesthesia. 

## 2013-09-16 ENCOUNTER — Other Ambulatory Visit (HOSPITAL_COMMUNITY): Payer: Self-pay | Admitting: Internal Medicine

## 2013-09-16 DIAGNOSIS — H2640 Unspecified secondary cataract: Secondary | ICD-10-CM | POA: Diagnosis not present

## 2013-09-16 DIAGNOSIS — Z1231 Encounter for screening mammogram for malignant neoplasm of breast: Secondary | ICD-10-CM

## 2013-09-16 DIAGNOSIS — M47814 Spondylosis without myelopathy or radiculopathy, thoracic region: Secondary | ICD-10-CM | POA: Diagnosis not present

## 2013-09-19 ENCOUNTER — Encounter: Payer: Self-pay | Admitting: Gastroenterology

## 2013-09-19 ENCOUNTER — Telehealth: Payer: Self-pay | Admitting: *Deleted

## 2013-09-19 ENCOUNTER — Ambulatory Visit (AMBULATORY_SURGERY_CENTER): Payer: Medicare Other | Admitting: Gastroenterology

## 2013-09-19 VITALS — BP 135/75 | HR 56 | Temp 97.4°F | Resp 21 | Ht 66.0 in | Wt 218.0 lb

## 2013-09-19 DIAGNOSIS — K219 Gastro-esophageal reflux disease without esophagitis: Secondary | ICD-10-CM | POA: Diagnosis not present

## 2013-09-19 DIAGNOSIS — K2970 Gastritis, unspecified, without bleeding: Secondary | ICD-10-CM

## 2013-09-19 DIAGNOSIS — R05 Cough: Secondary | ICD-10-CM | POA: Diagnosis not present

## 2013-09-19 DIAGNOSIS — I10 Essential (primary) hypertension: Secondary | ICD-10-CM | POA: Diagnosis not present

## 2013-09-19 DIAGNOSIS — K449 Diaphragmatic hernia without obstruction or gangrene: Secondary | ICD-10-CM | POA: Diagnosis not present

## 2013-09-19 DIAGNOSIS — Z1211 Encounter for screening for malignant neoplasm of colon: Secondary | ICD-10-CM | POA: Diagnosis not present

## 2013-09-19 DIAGNOSIS — K5730 Diverticulosis of large intestine without perforation or abscess without bleeding: Secondary | ICD-10-CM

## 2013-09-19 DIAGNOSIS — K2990 Gastroduodenitis, unspecified, without bleeding: Secondary | ICD-10-CM

## 2013-09-19 DIAGNOSIS — R1310 Dysphagia, unspecified: Secondary | ICD-10-CM

## 2013-09-19 DIAGNOSIS — R059 Cough, unspecified: Secondary | ICD-10-CM

## 2013-09-19 DIAGNOSIS — K222 Esophageal obstruction: Secondary | ICD-10-CM | POA: Diagnosis not present

## 2013-09-19 DIAGNOSIS — R053 Chronic cough: Secondary | ICD-10-CM

## 2013-09-19 MED ORDER — SODIUM CHLORIDE 0.9 % IV SOLN: 500.0000 mL | INTRAVENOUS | Status: DC

## 2013-09-19 NOTE — Progress Notes (Signed)
Report to pacu rn, vss, bbs=clear 

## 2013-09-19 NOTE — Op Note (Signed)
[place]  [address]. [place], [id number]   COLONOSCOPY PROCEDURE REPORT  PATIENT: Chloe Holmes, Chloe Holmes.  MR#: [id number] BIRTHDATE: [birth year] , 67  yrs. old GENDER: Female ENDOSCOPIST: Sable Feil, MD, [facility] REFERRED BY: PROCEDURE DATE:  09/19/2013 PROCEDURE:   Colonoscopy, screening History of Adenoma - Now for follow-up colonoscopy & has been > or = to 3 yrs.  N/A ASA CLASS:   Class II INDICATIONS:average risk screening. MEDICATIONS: propofol (Diprivan) 300mg  IV  DESCRIPTION OF PROCEDURE:   After the risks benefits and alternatives of the procedure were thoroughly explained, informed consent was obtained.  A digital rectal exam revealed no abnormalities of the rectum.   The LB [id number]  endoscope was introduced through the anus and advanced to the cecum, which was identified by both the appendix and ileocecal valve. No adverse events experienced.   The quality of the prep was excellent, using MoviPrep  The instrument was then slowly withdrawn as the colon was fully examined.      COLON FINDINGS: Mild diverticulosis was noted in the sigmoid colon. Retroflexed views revealed no abnormalities. The time to cecum=6 minutes 13 seconds.  Withdrawal time=8 minutes 00 seconds.  The scope was withdrawn and the procedure completed. COMPLICATIONS: There were no complications.  ENDOSCOPIC IMPRESSION: Mild diverticulosis was noted in the sigmoid colon ...no polyps noted  RECOMMENDATIONS: 1.  High fiber diet 2.  Continue current colorectal screening recommendations for "routine risk" patients with a repeat colonoscopy in 10 years. 3.  Upper endoscopy today   eSigned:  Sable Feil, MD, [facility] 09/19/2013 1:38 PM   cc: Shon Baton, MD

## 2013-09-19 NOTE — Patient Instructions (Addendum)
YOU HAD AN ENDOSCOPIC PROCEDURE TODAY AT THE [facility]: Refer to the procedure report that was given to you for any specific questions about what was found during the examination.  If the procedure report does not answer your questions, please call your gastroenterologist to clarify.  If you requested that your care partner not be given the details of your procedure findings, then the procedure report has been included in a sealed envelope for you to review at your convenience later.  YOU SHOULD EXPECT: Some feelings of bloating in the abdomen. Passage of more gas than usual.  Walking can help get rid of the air that was put into your GI tract during the procedure and reduce the bloating. If you had a lower endoscopy (such as a colonoscopy or flexible sigmoidoscopy) you may notice spotting of blood in your stool or on the toilet paper. If you underwent a bowel prep for your procedure, then you may not have a normal bowel movement for a few days.  DIET: Your first meal following the procedure should be a light meal and then it is ok to progress to your normal diet.  A half-sandwich or bowl of soup is an example of a good first meal.  Heavy or fried foods are harder to digest and may make you feel nauseous or bloated.  Likewise meals heavy in dairy and vegetables can cause extra gas to form and this can also increase the bloating.  Drink plenty of fluids but you should avoid alcoholic beverages for 24 hours.  ACTIVITY: Your care partner should take you home directly after the procedure.  You should plan to take it easy, moving slowly for the rest of the day.  You can resume normal activity the day after the procedure however you should NOT DRIVE or use heavy machinery for 24 hours (because of the sedation medicines used during the test).    SYMPTOMS TO REPORT IMMEDIATELY: A gastroenterologist can be reached at any hour.  During normal business hours, 8:30 AM to 5:00 PM Monday through Friday,  call [phone].  After hours and on weekends, please call the GI answering service at [phone] who will take a message and have the physician on call contact you.   Following lower endoscopy (colonoscopy or flexible sigmoidoscopy):  Excessive amounts of blood in the stool  Significant tenderness or worsening of abdominal pains  Swelling of the abdomen that is new, acute  Fever of 100F or higher  Following upper endoscopy (EGD)  Vomiting of blood or coffee ground material  New chest pain or pain under the shoulder blades  Painful or persistently difficult swallowing  New shortness of breath  Fever of 100F or higher  Black, tarry-looking stools  FOLLOW UP: If any biopsies were taken you will be contacted by phone or by letter within the next 1-3 weeks.  Call your gastroenterologist if you have not heard about the biopsies in 3 weeks.  Our staff will call the home number listed on your records the next business day following your procedure to check on you and address any questions or concerns that you may have at that time regarding the information given to you following your procedure. This is a courtesy call and so if there is no answer at the home number and we have not heard from you through the emergency physician on call, we will assume that you have returned to your regular daily activities without incident.  SIGNATURES/CONFIDENTIALITY: You and/or your care   partner have signed paperwork which will be entered into your electronic medical record.  These signatures attest to the fact that that the information above on your After Visit Summary has been reviewed and is understood.  Full responsibility of the confidentiality of this discharge information lies with you and/or your care-partner.  Diverticulosis, high fiber diet, gastritis, hiatal hernia, GERD-handouts given  Repeat colonoscopy in 10 years.  Dexilant 60 mg every morning.  Esophageal manometry.  Wait biopsy  results.

## 2013-09-19 NOTE — Telephone Encounter (Signed)
Per Dr Sharlett Iles, pt will transfer to Dr Hilarie Fredrickson. She needs an EM for Dysphagia, but Dr Hilarie Fredrickson want it before her visit or after? Per dr Hilarie Fredrickson, before her visit. Scheduled pt for EM on 10/03/13. Instructions were sent to the [facility] for April RN. Also scheduled f/u appt with Dr Hilarie Fredrickson for 10/17/13 and that appt was also sent to April. Pt may call back for questions.

## 2013-09-19 NOTE — Op Note (Signed)
[place]  [address]. [place], [id number]   ENDOSCOPY PROCEDURE REPORT  PATIENT: Chloe Holmes, Chloe Holmes.  MR#: [id number] BIRTHDATE: 09-Feb-1946 , 67  yrs. old GENDER: Female ENDOSCOPIST:David Consuello Masse, MD, [facility] REFERRED BY: PROCEDURE DATE:  09/19/2013 PROCEDURE:   EGD w/ biopsy for H.pylori ASA CLASS:    Class II INDICATIONS: chronic cough despite proton pump inhibitor therapy.[name] MEDICATION: There was residual sedation effect present from prior procedure and propofol (Diprivan) 100mg  IV TOPICAL ANESTHETIC:   Cetacaine Spray  DESCRIPTION OF PROCEDURE:   After the risks and benefits of the procedure were explained, informed consent was obtained.  The LB [id number]  endoscope was introduced through the mouth  and advanced to the second portion of the duodenum .  The instrument was slowly withdrawn as the mucosa was fully examined.      DUODENUM: The duodenal mucosa showed no abnormalities in the bulb and second portion of the duodenum.  ESOPHAGUS: The mucosa of the esophagus appeared normal.   A 2 cm hiatal hernia was noted.  STOMACH: There was mild antral gastropathy noted.there is linear erythema in and some nodularity of the antrum.  A biopsy was obtained and placed in CLO media    Retroflexed views revealed a hiatal hernia.    The scope was then withdrawn from the patient and the procedure completed.  COMPLICATIONS: There were no complications.   ENDOSCOPIC IMPRESSION: 1.   The duodenal mucosa showed no abnormalities in the bulb and second portion of the duodenum 2.   The mucosa of the esophagus appeared normal 3.   2 cm hiatal hernia  was noticed and the patient seemed to have rather free acid reflux throughout the procedure with severe coughing after the procedure.  However, there is no evidence of a large hilar hernia or erosive esophagitis.  Her picture is most consistent with chronic GERD and reactive airways disease. 4.   There was  mild antral gastropathy noted [T2] ...r/o H.pylori infection  RECOMMENDATIONS: 1.Await biopsy results 2.I have scheduled this patient to see Dr.Pyrtle as a new patient also to have a high-resolution esophageal manometry. 3. we'll change to Dexilant 60 mg every morning to see if this helps with her symptomatology.       _______________________________ eSignedSable Feil, MD, [facility] 09/19/2013 1:48 PM   antireflux   PATIENT NAME:  [name]. MR#: [id number]

## 2013-09-19 NOTE — Progress Notes (Signed)
Pt coughed a lot during recovery, pt states this is her normal cough, pt c/o persistant cough that she has had for 15 years stated by her.

## 2013-09-20 ENCOUNTER — Telehealth: Payer: Self-pay | Admitting: *Deleted

## 2013-09-20 DIAGNOSIS — M47814 Spondylosis without myelopathy or radiculopathy, thoracic region: Secondary | ICD-10-CM | POA: Diagnosis not present

## 2013-09-20 LAB — HELICOBACTER PYLORI SCREEN-BIOPSY: UREASE: NEGATIVE

## 2013-09-20 NOTE — Telephone Encounter (Signed)
  Follow up Call-  Call back number 09/19/2013  Post procedure Call Back phone  # [phone] cell  Permission to leave phone message Yes     Patient questions:  Do you have a fever, pain , or abdominal swelling? no Pain Score  0 *  Have you tolerated food without any problems? yes  Have you been able to return to your normal activities? yes  Do you have any questions about your discharge instructions: Diet   no Medications  no Follow up visit  no  Do you have questions or concerns about your Care? no  Actions: * If pain score is 4 or above: No action needed, pain <4.

## 2013-09-21 ENCOUNTER — Encounter: Payer: Self-pay | Admitting: Gastroenterology

## 2013-09-22 ENCOUNTER — Ambulatory Visit (HOSPITAL_COMMUNITY)
Admission: RE | Admit: 2013-09-22 | Discharge: 2013-09-22 | Disposition: A | Discharge status: Home / Self Care | Payer: Medicare Other | Source: Ambulatory Visit | Attending: Internal Medicine | Admitting: Internal Medicine

## 2013-09-22 DIAGNOSIS — Z1231 Encounter for screening mammogram for malignant neoplasm of breast: Secondary | ICD-10-CM

## 2013-09-27 DIAGNOSIS — M47814 Spondylosis without myelopathy or radiculopathy, thoracic region: Secondary | ICD-10-CM | POA: Diagnosis not present

## 2013-09-30 DIAGNOSIS — M47814 Spondylosis without myelopathy or radiculopathy, thoracic region: Secondary | ICD-10-CM | POA: Diagnosis not present

## 2013-10-03 ENCOUNTER — Ambulatory Visit (HOSPITAL_COMMUNITY)
Admission: RE | Admit: 2013-10-03 | Discharge: 2013-10-03 | Disposition: A | Discharge status: Home / Self Care | Payer: Medicare Other | Source: Ambulatory Visit | Attending: Internal Medicine | Admitting: Internal Medicine

## 2013-10-03 ENCOUNTER — Encounter (HOSPITAL_COMMUNITY)
Admission: RE | Disposition: A | Discharge status: Home / Self Care | Payer: Self-pay | Source: Ambulatory Visit | Attending: Internal Medicine

## 2013-10-03 DIAGNOSIS — R1310 Dysphagia, unspecified: Secondary | ICD-10-CM | POA: Diagnosis not present

## 2013-10-03 DIAGNOSIS — K224 Dyskinesia of esophagus: Secondary | ICD-10-CM | POA: Insufficient documentation

## 2013-10-03 HISTORY — PX: ESOPHAGEAL MANOMETRY: SHX5429

## 2013-10-03 SURGERY — MANOMETRY, ESOPHAGUS

## 2013-10-03 MED ORDER — LIDOCAINE VISCOUS 2 % MT SOLN
OROMUCOSAL | Status: AC
  Filled 2013-10-03: qty 15

## 2013-10-03 SURGICAL SUPPLY — 1 items: FACESHIELD LNG OPTICON STERILE (SAFETY) IMPLANT

## 2013-10-04 ENCOUNTER — Encounter (HOSPITAL_COMMUNITY): Payer: Self-pay | Admitting: Internal Medicine

## 2013-10-04 ENCOUNTER — Telehealth: Payer: Self-pay | Admitting: Gastroenterology

## 2013-10-04 DIAGNOSIS — M47814 Spondylosis without myelopathy or radiculopathy, thoracic region: Secondary | ICD-10-CM | POA: Diagnosis not present

## 2013-10-04 MED ORDER — DEXLANSOPRAZOLE 60 MG PO CPDR: 60.0000 mg | DELAYED_RELEASE_CAPSULE | Freq: Every day | ORAL | Status: DC

## 2013-10-04 NOTE — Telephone Encounter (Signed)
RX sent

## 2013-10-07 DIAGNOSIS — M47814 Spondylosis without myelopathy or radiculopathy, thoracic region: Secondary | ICD-10-CM | POA: Diagnosis not present

## 2013-10-14 DIAGNOSIS — M47814 Spondylosis without myelopathy or radiculopathy, thoracic region: Secondary | ICD-10-CM | POA: Diagnosis not present

## 2013-10-17 ENCOUNTER — Encounter: Payer: Self-pay | Admitting: Internal Medicine

## 2013-10-17 ENCOUNTER — Ambulatory Visit (INDEPENDENT_AMBULATORY_CARE_PROVIDER_SITE_OTHER): Payer: Medicare Other | Admitting: Internal Medicine

## 2013-10-17 VITALS — BP 126/74 | HR 56 | Ht 66.0 in | Wt 218.0 lb

## 2013-10-17 DIAGNOSIS — K449 Diaphragmatic hernia without obstruction or gangrene: Secondary | ICD-10-CM

## 2013-10-17 DIAGNOSIS — K5730 Diverticulosis of large intestine without perforation or abscess without bleeding: Secondary | ICD-10-CM | POA: Diagnosis not present

## 2013-10-17 DIAGNOSIS — K224 Dyskinesia of esophagus: Secondary | ICD-10-CM | POA: Diagnosis not present

## 2013-10-17 DIAGNOSIS — K219 Gastro-esophageal reflux disease without esophagitis: Secondary | ICD-10-CM

## 2013-10-17 DIAGNOSIS — K5900 Constipation, unspecified: Secondary | ICD-10-CM

## 2013-10-17 MED ORDER — DEXLANSOPRAZOLE 60 MG PO CPDR: 60.0000 mg | DELAYED_RELEASE_CAPSULE | Freq: Every day | ORAL | Status: DC

## 2013-10-17 NOTE — Patient Instructions (Signed)
We have sent the following medications to your pharmacy for you to pick up at your convenience: [place] can use milk of magnesia as needed for constipation.    Eat small frequent meal and chew well.  Follow up as needed

## 2013-10-17 NOTE — Progress Notes (Signed)
Patient ID: Chloe Holmes, female   DOB: [birth year], 68 y.o.   MRN: [id number] HPI: Chloe Holmes is a 68 yo female previously known to Dr. Sharlett Iles before her retirement with a history of diverticulosis, GERD and dysphagia with history of esophageal dilation who is seen in followup.  She is here alone today.  Dr. Sharlett Iles performed upper endoscopy and colonoscopy on 09/19/2013. Her colonoscopy was performed for screening. This exam was to the cecum with excellent prep. It revealed diverticulosis in the sigmoid colon but no other abnormalities. Upper endoscopy revealed normal esophageal mucosa with a 2 cm hiatal hernia. There was mild gastropathy in the antrum with biopsies negative for H. pylori. The examined duodenum was normal. Subsequent to her endoscopy she had an esophageal manometry performed on 10/03/2013.  She was switched from Prilosec to [facility] after her EGD.  Today she reports she is feeling well. The Dexilant is working considerably better for her heartburn and reflux symptoms. Her cough which has been worse of late has resolved and she feels that this is directly due to [place] therapy. Her dysphagia symptoms are very intermittent and she feels that she is dealing with these quite well. She avoids eating quickly and takes small bites and chews her food well. Her dysphagia is inconsistent and can be solids or liquids. She previously had esophageal dilation which may have helped some but was not dilated recently. With the change in PPI she feels her swallowing has improved even further. She reports she tends towards constipation and uses milk of magnesia onwith excellent result. She denies abdominal pain, blood in her stool or melena. Good appetite.  Past Medical History  Diagnosis Date  . Hypertension   . Arthritis     Past Surgical History  Procedure Laterality Date  . Dilation and curettage of uterus    . Orbital fracture surgery    . Cervical disc surgery    . Eye surgery     . Esophageal manometry N/A 10/03/2013    Procedure: ESOPHAGEAL MANOMETRY (EM);  Surgeon: Jerene Bears, MD;  Location: [facility];  Service: Gastroenterology;  Laterality: N/A;    Current Outpatient Prescriptions  Medication Sig Dispense Refill  . dexlansoprazole (DEXILANT) 60 MG capsule Take 1 capsule (60 mg total) by mouth daily.  90 capsule  3  . diclofenac (VOLTAREN) 50 MG EC tablet Take 50 mg by mouth daily.      . hydrochlorothiazide (HYDRODIURIL) 25 MG tablet Take 25 mg by mouth daily.      . Nebivolol HCl (BYSTOLIC PO) Take 20 mg by mouth.       . oxyCODONE-acetaminophen (PERCOCET/ROXICET) 5-325 MG per tablet Take 1 tablet by mouth every 6 (six) hours as needed for severe pain.  15 tablet  0  . Red Yeast Rice 600 MG TABS Take 600 mg by mouth daily.      . [DISCONTINUED] esomeprazole (NEXIUM) 40 MG capsule Take 40 mg by mouth daily before breakfast.       No current facility-administered medications for this visit.    Allergies  Allergen Reactions  . Morphine     Hypotension    Family History  Problem Relation Age of Onset  . Heart disease Mother   . Cancer Mother     colon  . Colon cancer Mother   . Hypertension Father   . Heart disease Father   . Lung cancer Father   . Cancer Father     lung  . Stomach cancer Paternal Grandfather   .  Esophageal cancer Neg Hx   . Rectal cancer Neg Hx     History  Substance Use Topics  . Smoking status: Never Smoker   . Smokeless tobacco: Never Used  . Alcohol Use: 4.2 - 6 oz/week    7-10 Glasses of wine per week    ROS: As per history of present illness, otherwise negative  BP 126/74  Pulse 56  Ht 5\' 6"  (1.676 m)  Wt 218 lb (98.884 kg)  BMI 35.20 kg/m2 Constitutional: Well-developed and well-nourished. No distress. HEENT: Normocephalic and atraumatic.  No scleral icterus. Cardiovascular: Normal rate, regular rhythm and intact distal pulses.  Pulmonary/chest: Effort normal and breath sounds normal. No wheezing, rales  or rhonchi. Extremities: no clubbing, cyanosis, or edema Neurological: Alert and oriented to person place and time. Psychiatric: Normal mood and affect. Behavior is normal.  RELEVANT LABS AND IMAGING: CBC    Component Value Date/Time   WBC 8.1 07/28/2013 2150   RBC 4.30 07/28/2013 2150   HGB 13.0 07/28/2013 2150   HCT 38.4 07/28/2013 2150   PLT 221 07/28/2013 2150   MCV 89.3 07/28/2013 2150   MCH 30.2 07/28/2013 2150   MCHC 33.9 07/28/2013 2150   RDW 12.9 07/28/2013 2150   LYMPHSABS 2.3 07/28/2013 2150   MONOABS 0.8 07/28/2013 2150   EOSABS 0.5 07/28/2013 2150   BASOSABS 0.0 07/28/2013 2150    CMP     Component Value Date/Time   NA 136 07/28/2013 2150   K 3.7 07/28/2013 2150   CL 101 07/28/2013 2150   CO2 22 07/28/2013 2150   GLUCOSE 96 07/28/2013 2150   BUN 22 07/28/2013 2150   CREATININE 0.89 07/28/2013 2150   CALCIUM 9.1 07/28/2013 2150   PROT 7.4 07/28/2013 2150   ALBUMIN 4.2 07/28/2013 2150   AST 24 07/28/2013 2150   ALT 31 07/28/2013 2150   ALKPHOS 70 07/28/2013 2150   BILITOT 0.3 07/28/2013 2150   GFRNONAA 65* 07/28/2013 2150   GFRAA 75* 07/28/2013 2150   Esophageal mano -- elevated residual LES pressure with nonspecific esophageal dysmotility not meeting criteria for achalasia  ASSESSMENT/PLAN: 68 yo female previously known to Dr. Sharlett Iles before her retirement with a history of diverticulosis, GERD and dysphagia with history of esophageal dilation who is seen in followup  1.  Esophageal dysmotility -- we have discussed her manometry findings at length today. 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 I do not think that this is achalasia at present. Her reflux, previously uncontrolled, was likely contributing to the dysmotility.  We discussed options which includes dilation of the LES or even Botox injection, but currently she feels her swallowing problems are not problematic for her. She does not wish for any further therapy at this time. I  have recommended she continue Dexilant 60 mg daily and that she continue to take small lites and chew her food well. If her symptoms become more problematic in the future, we could consider dilation or Botox. She understands this recommendation and will notify me should this occur. She is happy with this plan  2.  Constipation -- mild and responsive to milk of magnesia. She can continue this on an as-needed basis going forward per over-the-counter instructions.    3.  Diverticulosis -- mild without history of diverticulitis  Return as needed

## 2013-10-18 DIAGNOSIS — M47814 Spondylosis without myelopathy or radiculopathy, thoracic region: Secondary | ICD-10-CM | POA: Diagnosis not present

## 2013-11-15 DIAGNOSIS — M47814 Spondylosis without myelopathy or radiculopathy, thoracic region: Secondary | ICD-10-CM | POA: Diagnosis not present

## 2013-11-18 DIAGNOSIS — M47814 Spondylosis without myelopathy or radiculopathy, thoracic region: Secondary | ICD-10-CM | POA: Diagnosis not present

## 2013-11-21 DIAGNOSIS — M47814 Spondylosis without myelopathy or radiculopathy, thoracic region: Secondary | ICD-10-CM | POA: Diagnosis not present

## 2013-11-24 ENCOUNTER — Encounter: Payer: Self-pay | Admitting: Obstetrics and Gynecology

## 2013-11-24 ENCOUNTER — Ambulatory Visit (INDEPENDENT_AMBULATORY_CARE_PROVIDER_SITE_OTHER): Payer: Medicare Other | Admitting: Gynecology

## 2013-11-24 ENCOUNTER — Encounter: Payer: Self-pay | Admitting: Gynecology

## 2013-11-24 VITALS — BP 124/78 | Ht 65.0 in | Wt 219.0 lb

## 2013-11-24 DIAGNOSIS — N952 Postmenopausal atrophic vaginitis: Secondary | ICD-10-CM | POA: Diagnosis not present

## 2013-11-24 DIAGNOSIS — D259 Leiomyoma of uterus, unspecified: Secondary | ICD-10-CM

## 2013-11-24 DIAGNOSIS — Z780 Asymptomatic menopausal state: Secondary | ICD-10-CM

## 2013-11-24 DIAGNOSIS — N951 Menopausal and female climacteric states: Secondary | ICD-10-CM | POA: Diagnosis not present

## 2013-11-24 NOTE — Patient Instructions (Addendum)
Bone Densitometry Bone densitometry is a special X-ray that measures your bone density and can be used to help predict your risk of bone fractures. This test is used to determine bone mineral content and density to diagnose osteoporosis. Osteoporosis is the loss of bone that may cause the bone to become weak. Osteoporosis commonly occurs in women entering menopause. However, it may be found in men and in people with other diseases. PREPARATION FOR TEST No preparation necessary. WHO SHOULD BE TESTED?  All women older than 63.  Postmenopausal women (50 to 75) with risk factors for osteoporosis.  People with a previous fracture caused by normal activities.  People with a small body frame (less than 127 poundsor a body mass index [BMI] of less than 21).  People who have a parent with a hip fracture or history of osteoporosis.  People who smoke.  People who have rheumatoid arthritis.  Anyone who engages in excessive alcohol use (more than 3 drinks most days).  Women who experience early menopause. WHEN SHOULD YOU BE RETESTED? Current guidelines suggest that you should wait at least 2 years before doing a bone density test again if your first test was normal.Recent studies indicated that women with normal bone density may be able to wait a few years before needing to repeat a bone density test. You should discuss this with your caregiver.  NORMAL FINDINGS   Normal: less than standard deviation below normal (greater than -1).  Osteopenia: 1 to 2.5 standard deviations below normal (-1 to -2.5).  Osteoporosis: greater than 2.5 standard deviations below normal (less than -2.5). Test results are reported as a "T score" and a "Z score."The T score is a number that compares your bone density with the bone density of healthy, young women.The Z score is a number that compares your bone density with the scores of women who are the same age, gender, and race.  Ranges for normal findings may vary  among different laboratories and hospitals. You should always check with your doctor after having lab work or other tests done to discuss the meaning of your test results and whether your values are considered within normal limits. MEANING OF TEST  Your caregiver will go over the test results with you and discuss the importance and meaning of your results, as well as treatment options and the need for additional tests if necessary. OBTAINING THE TEST RESULTS It is your responsibility to obtain your test results. Ask the lab or department performing the test when and how you will get your results. Document Released: 09/02/2004 Document Revised: 11/03/2011 Document Reviewed: 09/25/2010 [facility] Patient Information 2014 [place]. Tetanus, Diphtheria (Td) Vaccine What You Need to Know WHY GET VACCINATED? Tetanus  and diphtheria are very serious diseases. They are rare in the [place] today, but people who do become infected often have severe complications. Td vaccine is used to protect adolescents and adults from both of these diseases. Both tetanus and diphtheria are infections caused by bacteria. Diphtheria spreads from person to person through coughing or sneezing. Tetanus-causing bacteria enter the body through cuts, scratches, or wounds. TETANUS (Lockjaw) causes painful muscle tightening and stiffness, usually all over the body.  It can lead to tightening of muscles in the head and neck so you can't open your mouth, swallow, or sometimes even breathe. Tetanus kills about 1 out of every 5 people who are infected. DIPHTHERIA can cause a thick coating to form in the back of the throat.  It can lead to  breathing problems, paralysis, heart failure, and death. Before vaccines, the [place] saw as many as 200,000 cases a year of diphtheria and hundreds of cases of tetanus. Since vaccination began, cases of both diseases have dropped by about 99%. TD VACCINE Td vaccine can protect  adolescents and adults from tetanus and diphtheria. Td is usually given as a booster dose every 10 years but it can also be given earlier after a severe and dirty wound or burn. Your doctor can give you more information. Td may safely be given at the same time as other vaccines. SOME PEOPLE SHOULD NOT GET THIS VACCINE  If you ever had a life-threatening allergic reaction after a dose of any tetanus or diphtheria containing vaccine, OR if you have a severe allergy to any part of this vaccine, you should not get Td. Tell your doctor if you have any severe allergies.  Talk to your doctor if you:  have epilepsy or another nervous system problem,  had severe pain or swelling after any vaccine containing diphtheria or tetanus,  ever had Guillain Barr Syndrome (GBS),  aren't feeling well on the day the shot is scheduled. RISKS OF A VACCINE REACTION With a vaccine, like any medicine, there is a chance of side effects. These are usually mild and go away on their own. Serious side effects are also possible, but are very rare. Most people who get Td vaccine do not have any problems with it. Mild Problems  following Td (Did not interfere with activities)  Pain where the shot was given (about 8 people in 10)  Redness or swelling where the shot was given (about 1 person in 3)  Mild fever (about 1 person in 15)  Headache or Tiredness (uncommon) Moderate Problems following Td (Interfered with activities, but did not require medical attention)  Fever over 102 F (38.9 C) (rare) Severe Problems  following Td (Unable to perform usual activities; required medical attention)  Swelling, severe pain, bleeding, or redness in the arm where the shot was given (rare). Problems that could happen after any vaccine:  Brief fainting spells can happen after any medical procedure, including vaccination. Sitting or lying down for about 15 minutes can help prevent fainting, and injuries caused by a fall. Tell  your doctor if you feel dizzy, or have vision changes or ringing in the ears.  Severe shoulder pain and reduced range of motion in the arm where a shot was given can happen, very rarely, after a vaccination.  Severe allergic reactions from a vaccine are very rare, estimated at less than 1 in a million doses. If one were to occur, it would usually be within a few minutes to a few hours after the vaccination. WHAT IF THERE IS A SERIOUS REACTION? What should I look for?  Look for anything that concerns you, such as signs of a severe allergic reaction, very high fever, or behavior changes. Signs of a severe allergic reaction can include hives, swelling of the face and throat, difficulty breathing, a fast heartbeat, dizziness, and weakness. These would usually start a few minutes to a few hours after the vaccination. What should I do?  If you think it is a severe allergic reaction or other emergency that can't wait, call 911 or get the person to the nearest hospital. Otherwise, call your doctor.  Afterward, the reaction should be reported to the Vaccine Adverse Event Reporting System (VAERS). Your doctor might file this report, or, you can do it yourself through the VAERS website  or by calling [phone]. VAERS is only for reporting reactions. They do not give medical advice. THE NATIONAL VACCINE INJURY COMPENSATION PROGRAM The National Vaccine Injury Compensation Program (VICP) is a federal program that was created to compensate people who may have been injured by certain vaccines. Persons who believe they may have been injured by a vaccine can learn about the program and about filing a claim by calling 669-871-8255 or visiting the [facility] website. HOW CAN I LEARN MORE?  Ask your doctor.  Contact your local or state health department.  Contact the Centers for Disease Control and Prevention (CDC):  Call (248) 314-4650 (1-800-CDC-INFO)  Visit CDC's vaccines website CDC Td Vaccine Interim  VIS (09/28/12) Document Released: 06/08/2006 Document Revised: 12/06/2012 Document Reviewed: 12/01/2012 [facility] Patient Information 2014 [place], [place]. Transvaginal Ultrasound Transvaginal ultrasound is a pelvic ultrasound, using a metal probe that is placed in the vagina, to look at a women's female organs. Transvaginal ultrasound is a method of seeing inside the pelvis of a woman. The ultrasound machine sends out sound waves from the transducer (probe). These sound waves bounce off body structures (like an echo) to create a picture. The picture shows up on a monitor. It is called transvaginal because the probe is inserted into the vagina. There should be very little discomfort from the vaginal probe. This test can also be used during pregnancy. Endovaginal ultrasound is another name for a transvaginal ultrasound. In a transabdominal ultrasound, the probe is placed on the outside of the belly. This method gives pictures that are lower quality than pictures from the transvaginal technique. Transvaginal ultrasound is used to look for problems of the female genital tract. Some such problems include:  Infertility problems.  Congenital (birth defect) malformations of the uterus and ovaries.  Tumors in the uterus.  Abnormal bleeding.  Ovarian tumors and cysts.  Abscess (inflamed tissue around pus) in the pelvis.  Unexplained abdominal or pelvic pain.  Pelvic infection. DURING PREGNANCY, TRANSVAGINAL ULTRASOUND MAY BE USED TO LOOK AT:  Normal pregnancy.  Ectopic pregnancy (pregnancy outside the uterus).  Fetal heartbeat.  Abnormalities in the pelvis, that are not seen well with transabdominal ultrasound.  Suspected twins or multiples.  Impending miscarriage.  Problems with the cervix (incompetent cervix, not able to stay closed and hold the baby).  When doing an amniocentesis (removing fluid from the pregnancy sac, for testing).  Looking for abnormalities of the baby.  Checking  the growth, development, and age of the fetus.  Measuring the amount of fluid in the amniotic sac.  When doing an external version of the baby (moving baby into correct position).  Evaluating the baby for problems in high risk pregnancies (biophysical profile).  Suspected fetal demise (death). Sometimes a special ultrasound method called Saline Infusion Sonography (SIS) is used for a more accurate look at the uterus. Sterile saline (salt water) is injected into the uterus of non-pregnant patients to see the inside of the uterus better. SIS is not used on pregnant women. The vaginal probe can also assist in obtaining biopsies of abnormal areas, in draining fluid from cysts on the ovary, and in finding IUDs (intrauterine device, birth control) that cannot be located. PREPARATION FOR TEST A transvaginal ultrasound is done with the bladder empty. The transabdominal ultrasound is done with your bladder full. You may be asked to drink several glasses of water before that exam. Sometimes, a transabdominal ultrasound is done just after a transvaginal ultrasound, to look at organs in your abdomen. PROCEDURE  You will lie  down on a table, with your knees bent and your feet in foot holders. The probe is covered with a condom. A sterile lubricant is put into the vagina and on the probe. The lubricant helps transmit the sound waves and avoid irritating the vagina. Your caregiver will move the probe inside the vaginal cavity to scan the pelvic structures. A normal test will show a normal pelvis and normal contents. An abnormal test will show abnormalities of the pelvis, placenta, or baby. ABNORMAL RESULTS MAY BE DUE TO:  Growths or tumors in the:  Uterus.  Ovaries.  Vagina.  Other pelvic structures.  Non-cancerous growths of the uterus and ovaries.  Twisting of the ovary, cutting off blood supply to the ovary (ovarian torsion).  Areas of infection, including:  Pelvic inflammatory  disease.  Abscess in the pelvis.  Locating an IUD. PROBLEMS FOUND IN PREGNANT WOMEN MAY INCLUDE:  Ectopic pregnancy (pregnancy outside the uterus).  Multiple pregnancies.  Early dilation (opening) of the cervix. This may indicate an incompetent cervix and early delivery.  Impending miscarriage.  Fetal death.  Problems with the placenta, including:  Placenta has grown over the opening of the womb (placenta previa).  Placenta has separated early in the womb (placental abruption).  Placenta grows into the muscle of the uterus (placenta accreta).  Tumors of pregnancy, including gestational trophoblastic disease. This is an abnormal pregnancy, with no fetus. The uterus is filled with many grape-like cysts that could sometimes be cancerous.  Incorrect position of the fetus (breech, vertex).  Intrauterine fetal growth retardation (IUGR) (poor growth in the womb).  Fetal abnormalities or infection. RISKS AND COMPLICATIONS There are no known risks to the ultrasound procedure. There is no X-ray used when doing an ultrasound. Document Released: 07/23/2004 Document Revised: 11/03/2011 Document Reviewed: 07/11/2009 [facility] Patient Information 2014 [place], [place].

## 2013-11-24 NOTE — Progress Notes (Signed)
Chloe Holmes 1945-10-08 [id number]   History:    68 y.o.  for GYN followup. Patient is been seen in the office was 2013. Review of patient's record had indicated that she had a past history of urinary stress incontinence but resolved after physical therapy and Kegel exercises. Patient is on no hormone replacement therapy. The patient not sexually active. Patient denies any vaginal bleeding. Patient had her colonoscopy this year which was normal. Patient with history of fibroid uterus that was noted an ultrasound in 2002 which had a dimension of a fundal fibroid of 3.5 x 2.5 x 3.5 cm. Her PCP is Dr. Virgina Jock who has been doing her blood work. Patient lives 6 months of the year here and the other 6 months in [place]. Her last bone density study was normal in 2010. Her mammogram this year was normal also. Patient with no prior history of abnormal Pap smears.   Past medical history,surgical history, family history and social history were all reviewed and documented in the EPIC chart.  Gynecologic History No LMP recorded. Patient is postmenopausal. Contraception: post menopausal status Last Pap: 2013. Results were: normal Last mammogram: 2015. Results were: normal  Obstetric History OB History  Gravida Para Term Preterm AB SAB TAB Ectopic Multiple Living  2 2 2       2     # Outcome Date GA Lbr Len/2nd Weight Sex Delivery Anes PTL Lv  2 TRM           1 TRM                ROS: A ROS was performed and pertinent positives and negatives are included in the history.  GENERAL: No fevers or chills. HEENT: No change in vision, no earache, sore throat or sinus congestion. NECK: No pain or stiffness. CARDIOVASCULAR: No chest pain or pressure. No palpitations. PULMONARY: No shortness of breath, cough or wheeze. GASTROINTESTINAL: No abdominal pain, nausea, vomiting or diarrhea, melena or bright red blood per rectum. GENITOURINARY: No urinary frequency, urgency, hesitancy or dysuria. MUSCULOSKELETAL:  No joint or muscle pain, no back pain, no recent trauma. DERMATOLOGIC: No rash, no itching, no lesions. ENDOCRINE: No polyuria, polydipsia, no heat or cold intolerance. No recent change in weight. HEMATOLOGICAL: No anemia or easy bruising or bleeding. NEUROLOGIC: No headache, seizures, numbness, tingling or weakness. PSYCHIATRIC: No depression, no loss of interest in normal activity or change in sleep pattern.     Exam: chaperone present  BP 124/78  Ht 5\' 5"  (1.651 m)  Wt 219 lb (99.338 kg)  BMI 36.44 kg/m2  Body mass index is 36.44 kg/(m^2).  General appearance : Well developed well nourished female. No acute distress HEENT: Neck supple, trachea midline, no carotid bruits, no thyroidmegaly Lungs: Clear to auscultation, no rhonchi or wheezes, or rib retractions  Heart: Regular rate and rhythm, no murmurs or gallops Breast:Examined in sitting and supine position were symmetrical in appearance, no palpable masses or tenderness,  no skin retraction, no nipple inversion, no nipple discharge, no skin discoloration, no axillary or supraclavicular lymphadenopathy Abdomen: no palpable masses or tenderness, no rebound or guarding Extremities: no edema or skin discoloration or tenderness  Pelvic:  Bartholin, Urethra, Skene Glands: Within normal limits             Vagina: No gross lesions or discharge, atrophic changes  Cervix: No gross lesions or discharge  Uterus  anteverted, normal size, shape and consistency, non-tender and mobile  Adnexa  Without masses or tenderness  Anus and perineum  normal   Rectovaginal  normal sphincter tone without palpated masses or tenderness             Hemoccult colonoscopy this year     Assessment/Plan:  68 y.o. female for annual exam with mild vaginal atrophy asymptomatic. PCP draws patient's blood work. Patient will return back to the office next week for an ultrasound for better assessment of her uterus and adnexa and to followup on her fibroid. She will  also schedule a bone density as well. She was reminded on the importance of calcium vitamin D and regular exercise for osteoporosis prevention. Patient's immunizations are up to date. Pap smear was not done today in accordance to the new guidelines. Patient is 68 years of age and no prior history of abnormal Pap smears.  Note: This dictation was prepared with  Dragon/digital dictation along withSmart phrase technology. Any transcriptional errors that result from this process are unintentional.   Terrance Mass MD, 9:10 AM 11/24/2013

## 2013-11-29 ENCOUNTER — Other Ambulatory Visit: Payer: Self-pay | Admitting: Gynecology

## 2013-11-29 ENCOUNTER — Ambulatory Visit (INDEPENDENT_AMBULATORY_CARE_PROVIDER_SITE_OTHER): Payer: Medicare Other

## 2013-11-29 DIAGNOSIS — M47814 Spondylosis without myelopathy or radiculopathy, thoracic region: Secondary | ICD-10-CM | POA: Diagnosis not present

## 2013-11-29 DIAGNOSIS — N951 Menopausal and female climacteric states: Secondary | ICD-10-CM

## 2013-11-29 DIAGNOSIS — Z780 Asymptomatic menopausal state: Secondary | ICD-10-CM

## 2013-11-30 ENCOUNTER — Ambulatory Visit (INDEPENDENT_AMBULATORY_CARE_PROVIDER_SITE_OTHER): Payer: Medicare Other | Admitting: Gynecology

## 2013-11-30 ENCOUNTER — Other Ambulatory Visit: Payer: Self-pay | Admitting: Gynecology

## 2013-11-30 ENCOUNTER — Ambulatory Visit (INDEPENDENT_AMBULATORY_CARE_PROVIDER_SITE_OTHER): Payer: Medicare Other

## 2013-11-30 DIAGNOSIS — N9489 Other specified conditions associated with female genital organs and menstrual cycle: Secondary | ICD-10-CM

## 2013-11-30 DIAGNOSIS — R9389 Abnormal findings on diagnostic imaging of other specified body structures: Secondary | ICD-10-CM

## 2013-11-30 DIAGNOSIS — N83339 Acquired atrophy of ovary and fallopian tube, unspecified side: Secondary | ICD-10-CM

## 2013-11-30 DIAGNOSIS — D259 Leiomyoma of uterus, unspecified: Secondary | ICD-10-CM | POA: Diagnosis not present

## 2013-11-30 DIAGNOSIS — D251 Intramural leiomyoma of uterus: Secondary | ICD-10-CM | POA: Diagnosis not present

## 2013-11-30 DIAGNOSIS — N882 Stricture and stenosis of cervix uteri: Secondary | ICD-10-CM

## 2013-11-30 DIAGNOSIS — D261 Other benign neoplasm of corpus uteri: Secondary | ICD-10-CM | POA: Diagnosis not present

## 2013-11-30 NOTE — Patient Instructions (Signed)
Hysterectomy Information  A hysterectomy is a surgery in which your uterus is removed. This surgery may be done to treat various medical problems. After the surgery, you will no longer have menstrual periods. The surgery will also make you unable to become pregnant (sterile). The fallopian tubes and ovaries can be removed (bilateral salpingo-oophorectomy) during this surgery as well.  REASONS FOR A HYSTERECTOMY  Persistent, abnormal bleeding.  Lasting (chronic) pelvic pain or infection.  The lining of the uterus (endometrium) starts growing outside the uterus (endometriosis).  The endometrium starts growing in the muscle of the uterus (adenomyosis).  The uterus falls down into the vagina (pelvic organ prolapse).  Noncancerous growths in the uterus (uterine fibroids) that cause symptoms.  Precancerous cells.  Cervical cancer or uterine cancer. TYPES OF HYSTERECTOMIES  Supracervical hysterectomy In this type, the top part of the uterus is removed, but not the cervix.  Total hysterectomy The uterus and cervix are removed.  Radical hysterectomy The uterus, the cervix, and the fibrous tissue that holds the uterus in place in the pelvis (parametrium) are removed. WAYS A HYSTERECTOMY CAN BE PERFORMED  Abdominal hysterectomy A large surgical cut (incision) is made in the abdomen. The uterus is removed through this incision.  Vaginal hysterectomy An incision is made in the vagina. The uterus is removed through this incision. There are no abdominal incisions.  Conventional laparoscopic hysterectomy Three or four small incisions are made in the abdomen. A thin, lighted tube with a camera (laparoscope) is inserted into one of the incisions. Other tools are put through the other incisions. The uterus is cut into small pieces. The small pieces are removed through the incisions, or they are removed through the vagina.  Laparoscopically assisted vaginal hysterectomy (LAVH) Three or four small  incisions are made in the abdomen. Part of the surgery is performed laparoscopically and part vaginally. The uterus is removed through the vagina.  Robot-assisted laparoscopic hysterectomy A laparoscope and other tools are inserted into 3 or 4 small incisions in the abdomen. A computer-controlled device is used to give the surgeon a 3D image and to help control the surgical instruments. This allows for more precise movements of surgical instruments. The uterus is cut into small pieces and removed through the incisions or removed through the vagina. RISKS AND COMPLICATIONS  Possible complications associated with this procedure include:  Bleeding and risk of blood transfusion. Tell your health care provider if you do not want to receive any blood products.  Blood clots in the legs or lung.  Infection.  Injury to surrounding organs.  Problems or side effects related to anesthesia.  Conversion to an abdominal hysterectomy from one of the other techniques. WHAT TO EXPECT AFTER A HYSTERECTOMY  You will be given pain medicine.  You will need to have someone with you for the first 3 5 days after you go home.  You will need to follow up with your surgeon in 2 4 weeks after surgery to evaluate your progress.  You may have early menopause symptoms such as hot flashes, night sweats, and insomnia.  If you had a hysterectomy for a problem that was not cancer or not a condition that could lead to cancer, then you no longer need Pap tests. However, even if you no longer need a Pap test, a regular exam is a good idea to make sure no other problems are starting. Document Released: 02/04/2001 Document Revised: 06/01/2013 Document Reviewed: 04/18/2013 [facility] Patient Information 2014 [place]. Fibroids Fibroids are lumps (tumors)  that can occur any place in a woman's body. These lumps are not cancerous. Fibroids vary in size, weight, and where they grow. HOME CARE  Do not take aspirin.  Write  down the number of pads or tampons you use during your period. Tell your doctor. This can help determine the best treatment for you. GET HELP RIGHT AWAY IF:  You have pain in your lower belly (abdomen) that is not helped with medicine.  You have cramps that are not helped with medicine.  You have more bleeding between or during your period.  You feel lightheaded or pass out (faint).  Your lower belly pain gets worse. MAKE SURE YOU:  Understand these instructions.  Will watch your condition.  Will get help right away if you are not doing well or get worse. Document Released: 09/13/2010 Document Revised: 11/03/2011 Document Reviewed: 09/13/2010 [employer] Patient Information 2014 [employer].  

## 2013-11-30 NOTE — Progress Notes (Signed)
   Patient presented to the office today to discuss the ultrasound that had been scheduled as a result of her last annual exam earlier this month. Patient had not been seen in the office is 2013.Review of patient's record had indicated that she had a past history of urinary stress incontinence but resolved after physical therapy and Kegel exercises. Patient is on no hormone replacement therapy. The patient not sexually active. Patient denies any vaginal bleeding.Patient with history of fibroid uterus that was noted an ultrasound in 2002 which had a dimension of a fundal fibroid of 3.5 x 2.5 x 3.5 cm.  Patient's ultrasound today demonstrated the following: Uterus measures 7.2 x 6.9 x 5.3 cm with endometrial stripe of 20.8 mm. Patient had several intramural fibroids measuring as follows: 23 x 26 mm, 32 x 32 mm, 21 x 21 mm. Prominent endometrial cavity with echogenic focus with a dimension is a 32 x 17 x 20 mm with a positive feeder vessel noted otherwise normal atrophic right and left ovary.  Patient was asked to return in the afternoon for sonohysterogram. The cervix was cleansed with Betadine solution and a sterile catheter was introduced into the uterine cavity and normal saline was instilled the following was noted:  A solid focus posterior left uterine wall was noted measuring 29 x 25 x 19 mm   Following the sonohysterogram a sterile Pipelle was introduced into the uterine cavity to perform an endometrial biopsy. The cervix require some dilatation in an effort to introduce the sterile Pipelle. Tissue obtained (minimal) was submitted for histological evaluation. Patient is leaving the country at the end of the week and she will be gone for several months in [place].  Assessment/plan: Patient will with 30 year history of fibroid uterus a large intracavitary leiomyoma like lesion was noted minimal tissue obtained during endometrial biopsy. I have explained to the patient that her overall all her fibroids  appear to have regressed in size over the years but we do not have any sonohysterogram to compare in the past. We did discuss the concerns about possible underlying leiomyoma sarcoma regardless of the biopsy report come back benign. She is going back to [place] he would not be back until September at which time she would like to proceed then with a laparoscopic assisted vaginal hysterectomy with bilateral salpingo-oophorectomy. Literature information on fibroids as well as on hysterectomy was provided. We did discuss the limitations of the CA 125 especially the potential for false elevation based on patient with history of fibroids.

## 2013-12-01 ENCOUNTER — Other Ambulatory Visit: Payer: BLUE CROSS/BLUE SHIELD

## 2013-12-01 ENCOUNTER — Ambulatory Visit: Payer: BLUE CROSS/BLUE SHIELD | Admitting: Gynecology

## 2013-12-19 ENCOUNTER — Encounter: Payer: Self-pay | Admitting: Gynecology

## 2014-06-19 DIAGNOSIS — D3131 Benign neoplasm of right choroid: Secondary | ICD-10-CM | POA: Diagnosis not present

## 2014-06-19 DIAGNOSIS — Z961 Presence of intraocular lens: Secondary | ICD-10-CM | POA: Diagnosis not present

## 2014-06-20 DIAGNOSIS — I700 Atherosclerosis of aorta: Secondary | ICD-10-CM | POA: Diagnosis not present

## 2014-06-20 DIAGNOSIS — R05 Cough: Secondary | ICD-10-CM | POA: Diagnosis not present

## 2014-06-20 DIAGNOSIS — M545 Low back pain: Secondary | ICD-10-CM | POA: Diagnosis not present

## 2014-06-20 DIAGNOSIS — I10 Essential (primary) hypertension: Secondary | ICD-10-CM | POA: Diagnosis not present

## 2014-06-20 DIAGNOSIS — D259 Leiomyoma of uterus, unspecified: Secondary | ICD-10-CM | POA: Diagnosis not present

## 2014-06-20 DIAGNOSIS — Z23 Encounter for immunization: Secondary | ICD-10-CM | POA: Diagnosis not present

## 2014-06-20 DIAGNOSIS — Z1389 Encounter for screening for other disorder: Secondary | ICD-10-CM | POA: Diagnosis not present

## 2014-06-20 DIAGNOSIS — E785 Hyperlipidemia, unspecified: Secondary | ICD-10-CM | POA: Diagnosis not present

## 2014-06-20 DIAGNOSIS — R1310 Dysphagia, unspecified: Secondary | ICD-10-CM | POA: Diagnosis not present

## 2014-06-20 DIAGNOSIS — M1990 Unspecified osteoarthritis, unspecified site: Secondary | ICD-10-CM | POA: Diagnosis not present

## 2014-06-26 ENCOUNTER — Encounter: Payer: Self-pay | Admitting: Gynecology

## 2014-07-12 DIAGNOSIS — L573 Poikiloderma of Civatte: Secondary | ICD-10-CM | POA: Diagnosis not present

## 2014-07-12 DIAGNOSIS — L814 Other melanin hyperpigmentation: Secondary | ICD-10-CM | POA: Diagnosis not present

## 2014-07-12 DIAGNOSIS — L308 Other specified dermatitis: Secondary | ICD-10-CM | POA: Diagnosis not present

## 2014-07-12 DIAGNOSIS — L570 Actinic keratosis: Secondary | ICD-10-CM | POA: Diagnosis not present

## 2014-07-12 DIAGNOSIS — L821 Other seborrheic keratosis: Secondary | ICD-10-CM | POA: Diagnosis not present

## 2014-07-12 DIAGNOSIS — D225 Melanocytic nevi of trunk: Secondary | ICD-10-CM | POA: Diagnosis not present

## 2014-07-12 DIAGNOSIS — D1801 Hemangioma of skin and subcutaneous tissue: Secondary | ICD-10-CM | POA: Diagnosis not present

## 2014-07-17 DIAGNOSIS — D259 Leiomyoma of uterus, unspecified: Secondary | ICD-10-CM | POA: Diagnosis not present

## 2014-08-01 DIAGNOSIS — D259 Leiomyoma of uterus, unspecified: Secondary | ICD-10-CM | POA: Diagnosis not present

## 2014-08-01 DIAGNOSIS — N950 Postmenopausal bleeding: Secondary | ICD-10-CM | POA: Diagnosis not present

## 2014-08-25 HISTORY — PX: COLONOSCOPY: SHX174

## 2014-09-04 ENCOUNTER — Other Ambulatory Visit: Payer: Self-pay | Admitting: Obstetrics and Gynecology

## 2014-09-04 DIAGNOSIS — N950 Postmenopausal bleeding: Secondary | ICD-10-CM | POA: Diagnosis not present

## 2014-09-04 DIAGNOSIS — N840 Polyp of corpus uteri: Secondary | ICD-10-CM | POA: Diagnosis not present

## 2014-09-04 DIAGNOSIS — N800 Endometriosis of uterus: Secondary | ICD-10-CM | POA: Diagnosis not present

## 2014-09-04 DIAGNOSIS — N888 Other specified noninflammatory disorders of cervix uteri: Secondary | ICD-10-CM | POA: Diagnosis not present

## 2014-09-04 DIAGNOSIS — N9489 Other specified conditions associated with female genital organs and menstrual cycle: Secondary | ICD-10-CM | POA: Diagnosis not present

## 2014-09-04 DIAGNOSIS — D259 Leiomyoma of uterus, unspecified: Secondary | ICD-10-CM | POA: Diagnosis not present

## 2014-09-04 DIAGNOSIS — N736 Female pelvic peritoneal adhesions (postinfective): Secondary | ICD-10-CM | POA: Diagnosis not present

## 2014-10-02 DIAGNOSIS — Z09 Encounter for follow-up examination after completed treatment for conditions other than malignant neoplasm: Secondary | ICD-10-CM | POA: Diagnosis not present

## 2014-10-02 DIAGNOSIS — Z1231 Encounter for screening mammogram for malignant neoplasm of breast: Secondary | ICD-10-CM | POA: Diagnosis not present

## 2014-10-24 DIAGNOSIS — M76822 Posterior tibial tendinitis, left leg: Secondary | ICD-10-CM | POA: Diagnosis not present

## 2014-10-24 DIAGNOSIS — M76821 Posterior tibial tendinitis, right leg: Secondary | ICD-10-CM | POA: Diagnosis not present

## 2014-11-06 DIAGNOSIS — M79671 Pain in right foot: Secondary | ICD-10-CM | POA: Diagnosis not present

## 2014-11-06 DIAGNOSIS — M79672 Pain in left foot: Secondary | ICD-10-CM | POA: Diagnosis not present

## 2014-11-09 DIAGNOSIS — I10 Essential (primary) hypertension: Secondary | ICD-10-CM | POA: Diagnosis not present

## 2014-11-09 DIAGNOSIS — M79672 Pain in left foot: Secondary | ICD-10-CM | POA: Diagnosis not present

## 2014-11-09 DIAGNOSIS — E785 Hyperlipidemia, unspecified: Secondary | ICD-10-CM | POA: Diagnosis not present

## 2014-11-09 DIAGNOSIS — M79671 Pain in right foot: Secondary | ICD-10-CM | POA: Diagnosis not present

## 2014-11-13 DIAGNOSIS — M79672 Pain in left foot: Secondary | ICD-10-CM | POA: Diagnosis not present

## 2014-11-13 DIAGNOSIS — M79671 Pain in right foot: Secondary | ICD-10-CM | POA: Diagnosis not present

## 2014-11-15 DIAGNOSIS — Z1212 Encounter for screening for malignant neoplasm of rectum: Secondary | ICD-10-CM | POA: Diagnosis not present

## 2014-11-15 DIAGNOSIS — M79671 Pain in right foot: Secondary | ICD-10-CM | POA: Diagnosis not present

## 2014-11-15 DIAGNOSIS — M79672 Pain in left foot: Secondary | ICD-10-CM | POA: Diagnosis not present

## 2014-11-16 DIAGNOSIS — I700 Atherosclerosis of aorta: Secondary | ICD-10-CM | POA: Diagnosis not present

## 2014-11-16 DIAGNOSIS — K5900 Constipation, unspecified: Secondary | ICD-10-CM | POA: Diagnosis not present

## 2014-11-16 DIAGNOSIS — E785 Hyperlipidemia, unspecified: Secondary | ICD-10-CM | POA: Diagnosis not present

## 2014-11-16 DIAGNOSIS — Z1389 Encounter for screening for other disorder: Secondary | ICD-10-CM | POA: Diagnosis not present

## 2014-11-16 DIAGNOSIS — M545 Low back pain: Secondary | ICD-10-CM | POA: Diagnosis not present

## 2014-11-16 DIAGNOSIS — I10 Essential (primary) hypertension: Secondary | ICD-10-CM | POA: Diagnosis not present

## 2014-11-16 DIAGNOSIS — R1310 Dysphagia, unspecified: Secondary | ICD-10-CM | POA: Diagnosis not present

## 2014-11-16 DIAGNOSIS — Z0000 Encounter for general adult medical examination without abnormal findings: Secondary | ICD-10-CM | POA: Diagnosis not present

## 2014-11-16 DIAGNOSIS — D259 Leiomyoma of uterus, unspecified: Secondary | ICD-10-CM | POA: Diagnosis not present

## 2014-11-16 DIAGNOSIS — Z6836 Body mass index (BMI) 36.0-36.9, adult: Secondary | ICD-10-CM | POA: Diagnosis not present

## 2014-11-16 DIAGNOSIS — E669 Obesity, unspecified: Secondary | ICD-10-CM | POA: Diagnosis not present

## 2014-11-16 DIAGNOSIS — Z23 Encounter for immunization: Secondary | ICD-10-CM | POA: Diagnosis not present

## 2015-02-09 ENCOUNTER — Encounter: Payer: Self-pay | Admitting: Gastroenterology

## 2015-06-26 DIAGNOSIS — I10 Essential (primary) hypertension: Secondary | ICD-10-CM | POA: Diagnosis not present

## 2015-06-26 DIAGNOSIS — Z23 Encounter for immunization: Secondary | ICD-10-CM | POA: Diagnosis not present

## 2015-06-26 DIAGNOSIS — M545 Low back pain: Secondary | ICD-10-CM | POA: Diagnosis not present

## 2015-06-26 DIAGNOSIS — K5900 Constipation, unspecified: Secondary | ICD-10-CM | POA: Diagnosis not present

## 2015-06-26 DIAGNOSIS — Z6836 Body mass index (BMI) 36.0-36.9, adult: Secondary | ICD-10-CM | POA: Diagnosis not present

## 2015-06-26 DIAGNOSIS — E669 Obesity, unspecified: Secondary | ICD-10-CM | POA: Diagnosis not present

## 2015-06-26 DIAGNOSIS — E785 Hyperlipidemia, unspecified: Secondary | ICD-10-CM | POA: Diagnosis not present

## 2015-06-26 DIAGNOSIS — N951 Menopausal and female climacteric states: Secondary | ICD-10-CM | POA: Diagnosis not present

## 2015-06-26 DIAGNOSIS — I700 Atherosclerosis of aorta: Secondary | ICD-10-CM | POA: Diagnosis not present

## 2015-07-11 DIAGNOSIS — L820 Inflamed seborrheic keratosis: Secondary | ICD-10-CM | POA: Diagnosis not present

## 2015-07-11 DIAGNOSIS — D2372 Other benign neoplasm of skin of left lower limb, including hip: Secondary | ICD-10-CM | POA: Diagnosis not present

## 2015-07-11 DIAGNOSIS — D1801 Hemangioma of skin and subcutaneous tissue: Secondary | ICD-10-CM | POA: Diagnosis not present

## 2015-07-11 DIAGNOSIS — L821 Other seborrheic keratosis: Secondary | ICD-10-CM | POA: Diagnosis not present

## 2015-07-11 DIAGNOSIS — L814 Other melanin hyperpigmentation: Secondary | ICD-10-CM | POA: Diagnosis not present

## 2015-07-11 DIAGNOSIS — D225 Melanocytic nevi of trunk: Secondary | ICD-10-CM | POA: Diagnosis not present

## 2015-09-05 DIAGNOSIS — H26492 Other secondary cataract, left eye: Secondary | ICD-10-CM | POA: Diagnosis not present

## 2015-09-05 DIAGNOSIS — Z961 Presence of intraocular lens: Secondary | ICD-10-CM | POA: Diagnosis not present

## 2015-09-05 DIAGNOSIS — H524 Presbyopia: Secondary | ICD-10-CM | POA: Diagnosis not present

## 2015-10-11 DIAGNOSIS — Z6836 Body mass index (BMI) 36.0-36.9, adult: Secondary | ICD-10-CM | POA: Diagnosis not present

## 2015-10-11 DIAGNOSIS — Z1231 Encounter for screening mammogram for malignant neoplasm of breast: Secondary | ICD-10-CM | POA: Diagnosis not present

## 2015-10-11 DIAGNOSIS — Z01419 Encounter for gynecological examination (general) (routine) without abnormal findings: Secondary | ICD-10-CM | POA: Diagnosis not present

## 2015-11-15 DIAGNOSIS — E784 Other hyperlipidemia: Secondary | ICD-10-CM | POA: Diagnosis not present

## 2015-11-15 DIAGNOSIS — I10 Essential (primary) hypertension: Secondary | ICD-10-CM | POA: Diagnosis not present

## 2015-11-26 DIAGNOSIS — I10 Essential (primary) hypertension: Secondary | ICD-10-CM | POA: Diagnosis not present

## 2015-11-26 DIAGNOSIS — E784 Other hyperlipidemia: Secondary | ICD-10-CM | POA: Diagnosis not present

## 2015-11-26 DIAGNOSIS — E668 Other obesity: Secondary | ICD-10-CM | POA: Diagnosis not present

## 2015-11-26 DIAGNOSIS — I700 Atherosclerosis of aorta: Secondary | ICD-10-CM | POA: Diagnosis not present

## 2015-11-26 DIAGNOSIS — Z1389 Encounter for screening for other disorder: Secondary | ICD-10-CM | POA: Diagnosis not present

## 2015-11-26 DIAGNOSIS — N951 Menopausal and female climacteric states: Secondary | ICD-10-CM | POA: Diagnosis not present

## 2015-11-26 DIAGNOSIS — R002 Palpitations: Secondary | ICD-10-CM | POA: Diagnosis not present

## 2015-11-26 DIAGNOSIS — M545 Low back pain: Secondary | ICD-10-CM | POA: Diagnosis not present

## 2015-11-26 DIAGNOSIS — Z6837 Body mass index (BMI) 37.0-37.9, adult: Secondary | ICD-10-CM | POA: Diagnosis not present

## 2015-11-26 DIAGNOSIS — K5900 Constipation, unspecified: Secondary | ICD-10-CM | POA: Diagnosis not present

## 2015-11-26 DIAGNOSIS — D259 Leiomyoma of uterus, unspecified: Secondary | ICD-10-CM | POA: Diagnosis not present

## 2015-11-26 DIAGNOSIS — Z0000 Encounter for general adult medical examination without abnormal findings: Secondary | ICD-10-CM | POA: Diagnosis not present

## 2015-11-29 DIAGNOSIS — M5136 Other intervertebral disc degeneration, lumbar region: Secondary | ICD-10-CM | POA: Diagnosis not present

## 2015-11-30 DIAGNOSIS — Z1212 Encounter for screening for malignant neoplasm of rectum: Secondary | ICD-10-CM | POA: Diagnosis not present

## 2016-07-01 DIAGNOSIS — I10 Essential (primary) hypertension: Secondary | ICD-10-CM | POA: Diagnosis not present

## 2016-07-01 DIAGNOSIS — E668 Other obesity: Secondary | ICD-10-CM | POA: Diagnosis not present

## 2016-07-01 DIAGNOSIS — J3489 Other specified disorders of nose and nasal sinuses: Secondary | ICD-10-CM | POA: Diagnosis not present

## 2016-07-01 DIAGNOSIS — E784 Other hyperlipidemia: Secondary | ICD-10-CM | POA: Diagnosis not present

## 2016-07-01 DIAGNOSIS — G4709 Other insomnia: Secondary | ICD-10-CM | POA: Diagnosis not present

## 2016-07-01 DIAGNOSIS — Z6836 Body mass index (BMI) 36.0-36.9, adult: Secondary | ICD-10-CM | POA: Diagnosis not present

## 2016-07-08 DIAGNOSIS — Z23 Encounter for immunization: Secondary | ICD-10-CM | POA: Diagnosis not present

## 2016-07-09 DIAGNOSIS — L814 Other melanin hyperpigmentation: Secondary | ICD-10-CM | POA: Diagnosis not present

## 2016-07-09 DIAGNOSIS — D225 Melanocytic nevi of trunk: Secondary | ICD-10-CM | POA: Diagnosis not present

## 2016-07-09 DIAGNOSIS — D1801 Hemangioma of skin and subcutaneous tissue: Secondary | ICD-10-CM | POA: Diagnosis not present

## 2016-07-09 DIAGNOSIS — L821 Other seborrheic keratosis: Secondary | ICD-10-CM | POA: Diagnosis not present

## 2016-07-09 DIAGNOSIS — L570 Actinic keratosis: Secondary | ICD-10-CM | POA: Diagnosis not present

## 2016-09-03 DIAGNOSIS — Z0000 Encounter for general adult medical examination without abnormal findings: Secondary | ICD-10-CM | POA: Diagnosis not present

## 2016-09-03 DIAGNOSIS — M1990 Unspecified osteoarthritis, unspecified site: Secondary | ICD-10-CM | POA: Diagnosis not present

## 2016-10-01 DIAGNOSIS — Z961 Presence of intraocular lens: Secondary | ICD-10-CM | POA: Diagnosis not present

## 2016-10-21 DIAGNOSIS — Z1231 Encounter for screening mammogram for malignant neoplasm of breast: Secondary | ICD-10-CM | POA: Diagnosis not present

## 2016-10-21 DIAGNOSIS — Z01419 Encounter for gynecological examination (general) (routine) without abnormal findings: Secondary | ICD-10-CM | POA: Diagnosis not present

## 2016-10-21 DIAGNOSIS — Z6836 Body mass index (BMI) 36.0-36.9, adult: Secondary | ICD-10-CM | POA: Diagnosis not present

## 2016-11-26 DIAGNOSIS — I10 Essential (primary) hypertension: Secondary | ICD-10-CM | POA: Diagnosis not present

## 2016-11-26 DIAGNOSIS — E784 Other hyperlipidemia: Secondary | ICD-10-CM | POA: Diagnosis not present

## 2016-12-02 DIAGNOSIS — R002 Palpitations: Secondary | ICD-10-CM | POA: Diagnosis not present

## 2016-12-02 DIAGNOSIS — E668 Other obesity: Secondary | ICD-10-CM | POA: Diagnosis not present

## 2016-12-02 DIAGNOSIS — I700 Atherosclerosis of aorta: Secondary | ICD-10-CM | POA: Diagnosis not present

## 2016-12-02 DIAGNOSIS — Z1389 Encounter for screening for other disorder: Secondary | ICD-10-CM | POA: Diagnosis not present

## 2016-12-02 DIAGNOSIS — J3489 Other specified disorders of nose and nasal sinuses: Secondary | ICD-10-CM | POA: Diagnosis not present

## 2016-12-02 DIAGNOSIS — E784 Other hyperlipidemia: Secondary | ICD-10-CM | POA: Diagnosis not present

## 2016-12-02 DIAGNOSIS — Z6836 Body mass index (BMI) 36.0-36.9, adult: Secondary | ICD-10-CM | POA: Diagnosis not present

## 2016-12-02 DIAGNOSIS — M1990 Unspecified osteoarthritis, unspecified site: Secondary | ICD-10-CM | POA: Diagnosis not present

## 2016-12-02 DIAGNOSIS — I10 Essential (primary) hypertension: Secondary | ICD-10-CM | POA: Diagnosis not present

## 2016-12-02 DIAGNOSIS — G4709 Other insomnia: Secondary | ICD-10-CM | POA: Diagnosis not present

## 2016-12-02 DIAGNOSIS — Z0000 Encounter for general adult medical examination without abnormal findings: Secondary | ICD-10-CM | POA: Diagnosis not present

## 2016-12-02 DIAGNOSIS — K449 Diaphragmatic hernia without obstruction or gangrene: Secondary | ICD-10-CM | POA: Diagnosis not present

## 2016-12-07 DIAGNOSIS — Z1389 Encounter for screening for other disorder: Secondary | ICD-10-CM | POA: Diagnosis not present

## 2016-12-07 DIAGNOSIS — I700 Atherosclerosis of aorta: Secondary | ICD-10-CM | POA: Diagnosis not present

## 2016-12-07 DIAGNOSIS — G4709 Other insomnia: Secondary | ICD-10-CM | POA: Diagnosis not present

## 2016-12-07 DIAGNOSIS — R002 Palpitations: Secondary | ICD-10-CM | POA: Diagnosis not present

## 2016-12-07 DIAGNOSIS — I10 Essential (primary) hypertension: Secondary | ICD-10-CM | POA: Diagnosis not present

## 2016-12-07 DIAGNOSIS — Z6836 Body mass index (BMI) 36.0-36.9, adult: Secondary | ICD-10-CM | POA: Diagnosis not present

## 2016-12-07 DIAGNOSIS — E784 Other hyperlipidemia: Secondary | ICD-10-CM | POA: Diagnosis not present

## 2016-12-07 DIAGNOSIS — Z0000 Encounter for general adult medical examination without abnormal findings: Secondary | ICD-10-CM | POA: Diagnosis not present

## 2016-12-07 DIAGNOSIS — E668 Other obesity: Secondary | ICD-10-CM | POA: Diagnosis not present

## 2016-12-07 DIAGNOSIS — J3489 Other specified disorders of nose and nasal sinuses: Secondary | ICD-10-CM | POA: Diagnosis not present

## 2016-12-07 DIAGNOSIS — M1990 Unspecified osteoarthritis, unspecified site: Secondary | ICD-10-CM | POA: Diagnosis not present

## 2016-12-07 DIAGNOSIS — K449 Diaphragmatic hernia without obstruction or gangrene: Secondary | ICD-10-CM | POA: Diagnosis not present

## 2016-12-11 DIAGNOSIS — Z1212 Encounter for screening for malignant neoplasm of rectum: Secondary | ICD-10-CM | POA: Diagnosis not present

## 2017-01-07 ENCOUNTER — Encounter: Payer: Self-pay | Admitting: Gynecology

## 2017-07-09 DIAGNOSIS — M1990 Unspecified osteoarthritis, unspecified site: Secondary | ICD-10-CM | POA: Diagnosis not present

## 2017-07-09 DIAGNOSIS — Z6837 Body mass index (BMI) 37.0-37.9, adult: Secondary | ICD-10-CM | POA: Diagnosis not present

## 2017-07-09 DIAGNOSIS — I10 Essential (primary) hypertension: Secondary | ICD-10-CM | POA: Diagnosis not present

## 2017-07-09 DIAGNOSIS — E668 Other obesity: Secondary | ICD-10-CM | POA: Diagnosis not present

## 2017-07-09 DIAGNOSIS — K5909 Other constipation: Secondary | ICD-10-CM | POA: Diagnosis not present

## 2017-07-09 DIAGNOSIS — E7849 Other hyperlipidemia: Secondary | ICD-10-CM | POA: Diagnosis not present

## 2017-07-09 DIAGNOSIS — G4709 Other insomnia: Secondary | ICD-10-CM | POA: Diagnosis not present

## 2017-07-10 DIAGNOSIS — L821 Other seborrheic keratosis: Secondary | ICD-10-CM | POA: Diagnosis not present

## 2017-07-10 DIAGNOSIS — D2372 Other benign neoplasm of skin of left lower limb, including hip: Secondary | ICD-10-CM | POA: Diagnosis not present

## 2017-07-10 DIAGNOSIS — K130 Diseases of lips: Secondary | ICD-10-CM | POA: Diagnosis not present

## 2017-07-10 DIAGNOSIS — D225 Melanocytic nevi of trunk: Secondary | ICD-10-CM | POA: Diagnosis not present

## 2017-07-10 DIAGNOSIS — L814 Other melanin hyperpigmentation: Secondary | ICD-10-CM | POA: Diagnosis not present

## 2017-07-10 DIAGNOSIS — D692 Other nonthrombocytopenic purpura: Secondary | ICD-10-CM | POA: Diagnosis not present

## 2017-07-10 DIAGNOSIS — L820 Inflamed seborrheic keratosis: Secondary | ICD-10-CM | POA: Diagnosis not present

## 2017-07-10 DIAGNOSIS — L570 Actinic keratosis: Secondary | ICD-10-CM | POA: Diagnosis not present

## 2017-07-10 DIAGNOSIS — D2271 Melanocytic nevi of right lower limb, including hip: Secondary | ICD-10-CM | POA: Diagnosis not present

## 2017-08-24 DIAGNOSIS — Z6836 Body mass index (BMI) 36.0-36.9, adult: Secondary | ICD-10-CM | POA: Diagnosis not present

## 2017-08-24 DIAGNOSIS — R05 Cough: Secondary | ICD-10-CM | POA: Diagnosis not present

## 2017-08-24 DIAGNOSIS — J3489 Other specified disorders of nose and nasal sinuses: Secondary | ICD-10-CM | POA: Diagnosis not present

## 2017-08-24 DIAGNOSIS — R509 Fever, unspecified: Secondary | ICD-10-CM | POA: Diagnosis not present

## 2017-08-24 DIAGNOSIS — K449 Diaphragmatic hernia without obstruction or gangrene: Secondary | ICD-10-CM | POA: Diagnosis not present

## 2017-10-27 DIAGNOSIS — R05 Cough: Secondary | ICD-10-CM | POA: Diagnosis not present

## 2017-10-27 DIAGNOSIS — K130 Diseases of lips: Secondary | ICD-10-CM | POA: Diagnosis not present

## 2017-10-27 DIAGNOSIS — Z6837 Body mass index (BMI) 37.0-37.9, adult: Secondary | ICD-10-CM | POA: Diagnosis not present

## 2017-10-27 DIAGNOSIS — L988 Other specified disorders of the skin and subcutaneous tissue: Secondary | ICD-10-CM | POA: Diagnosis not present

## 2017-10-27 DIAGNOSIS — J3489 Other specified disorders of nose and nasal sinuses: Secondary | ICD-10-CM | POA: Diagnosis not present

## 2017-11-02 DIAGNOSIS — H04123 Dry eye syndrome of bilateral lacrimal glands: Secondary | ICD-10-CM | POA: Diagnosis not present

## 2017-11-02 DIAGNOSIS — H524 Presbyopia: Secondary | ICD-10-CM | POA: Diagnosis not present

## 2017-11-02 DIAGNOSIS — Z961 Presence of intraocular lens: Secondary | ICD-10-CM | POA: Diagnosis not present

## 2017-11-02 DIAGNOSIS — H5203 Hypermetropia, bilateral: Secondary | ICD-10-CM | POA: Diagnosis not present

## 2017-11-02 DIAGNOSIS — D3131 Benign neoplasm of right choroid: Secondary | ICD-10-CM | POA: Diagnosis not present

## 2017-11-02 DIAGNOSIS — H52203 Unspecified astigmatism, bilateral: Secondary | ICD-10-CM | POA: Diagnosis not present

## 2017-11-06 DIAGNOSIS — R0981 Nasal congestion: Secondary | ICD-10-CM | POA: Diagnosis not present

## 2017-11-18 DIAGNOSIS — Z6837 Body mass index (BMI) 37.0-37.9, adult: Secondary | ICD-10-CM | POA: Diagnosis not present

## 2017-11-18 DIAGNOSIS — Z01419 Encounter for gynecological examination (general) (routine) without abnormal findings: Secondary | ICD-10-CM | POA: Diagnosis not present

## 2017-11-18 DIAGNOSIS — N3945 Continuous leakage: Secondary | ICD-10-CM | POA: Diagnosis not present

## 2017-11-18 DIAGNOSIS — Z1231 Encounter for screening mammogram for malignant neoplasm of breast: Secondary | ICD-10-CM | POA: Diagnosis not present

## 2017-12-01 DIAGNOSIS — R82998 Other abnormal findings in urine: Secondary | ICD-10-CM | POA: Diagnosis not present

## 2017-12-01 DIAGNOSIS — I10 Essential (primary) hypertension: Secondary | ICD-10-CM | POA: Diagnosis not present

## 2017-12-01 DIAGNOSIS — E785 Hyperlipidemia, unspecified: Secondary | ICD-10-CM | POA: Diagnosis not present

## 2017-12-04 DIAGNOSIS — M1990 Unspecified osteoarthritis, unspecified site: Secondary | ICD-10-CM | POA: Diagnosis not present

## 2017-12-04 DIAGNOSIS — Z23 Encounter for immunization: Secondary | ICD-10-CM | POA: Diagnosis not present

## 2017-12-04 DIAGNOSIS — R1310 Dysphagia, unspecified: Secondary | ICD-10-CM | POA: Diagnosis not present

## 2017-12-04 DIAGNOSIS — R002 Palpitations: Secondary | ICD-10-CM | POA: Diagnosis not present

## 2017-12-04 DIAGNOSIS — I700 Atherosclerosis of aorta: Secondary | ICD-10-CM | POA: Diagnosis not present

## 2017-12-04 DIAGNOSIS — G4700 Insomnia, unspecified: Secondary | ICD-10-CM | POA: Diagnosis not present

## 2017-12-04 DIAGNOSIS — I77810 Thoracic aortic ectasia: Secondary | ICD-10-CM | POA: Diagnosis not present

## 2017-12-04 DIAGNOSIS — Z6837 Body mass index (BMI) 37.0-37.9, adult: Secondary | ICD-10-CM | POA: Diagnosis not present

## 2017-12-04 DIAGNOSIS — Z0000 Encounter for general adult medical examination without abnormal findings: Secondary | ICD-10-CM | POA: Diagnosis not present

## 2017-12-04 DIAGNOSIS — Z1389 Encounter for screening for other disorder: Secondary | ICD-10-CM | POA: Diagnosis not present

## 2017-12-04 DIAGNOSIS — K5900 Constipation, unspecified: Secondary | ICD-10-CM | POA: Diagnosis not present

## 2017-12-04 DIAGNOSIS — E7849 Other hyperlipidemia: Secondary | ICD-10-CM | POA: Diagnosis not present

## 2017-12-07 DIAGNOSIS — Z1212 Encounter for screening for malignant neoplasm of rectum: Secondary | ICD-10-CM | POA: Diagnosis not present

## 2017-12-08 ENCOUNTER — Other Ambulatory Visit: Payer: Self-pay | Admitting: Internal Medicine

## 2017-12-08 DIAGNOSIS — E785 Hyperlipidemia, unspecified: Secondary | ICD-10-CM

## 2017-12-16 DIAGNOSIS — M170 Bilateral primary osteoarthritis of knee: Secondary | ICD-10-CM | POA: Diagnosis not present

## 2017-12-16 DIAGNOSIS — M25561 Pain in right knee: Secondary | ICD-10-CM | POA: Diagnosis not present

## 2017-12-16 DIAGNOSIS — M25562 Pain in left knee: Secondary | ICD-10-CM | POA: Diagnosis not present

## 2017-12-16 DIAGNOSIS — M545 Low back pain: Secondary | ICD-10-CM | POA: Diagnosis not present

## 2017-12-17 ENCOUNTER — Ambulatory Visit
Admission: RE | Admit: 2017-12-17 | Discharge: 2017-12-17 | Disposition: A | Discharge status: Home / Self Care | Payer: Self-pay | Source: Ambulatory Visit | Attending: Internal Medicine | Admitting: Internal Medicine

## 2017-12-17 DIAGNOSIS — E785 Hyperlipidemia, unspecified: Secondary | ICD-10-CM

## 2017-12-18 DIAGNOSIS — M25561 Pain in right knee: Secondary | ICD-10-CM | POA: Diagnosis not present

## 2017-12-18 DIAGNOSIS — M170 Bilateral primary osteoarthritis of knee: Secondary | ICD-10-CM | POA: Diagnosis not present

## 2017-12-18 DIAGNOSIS — M545 Low back pain: Secondary | ICD-10-CM | POA: Diagnosis not present

## 2017-12-18 DIAGNOSIS — M25562 Pain in left knee: Secondary | ICD-10-CM | POA: Diagnosis not present

## 2017-12-21 DIAGNOSIS — M170 Bilateral primary osteoarthritis of knee: Secondary | ICD-10-CM | POA: Diagnosis not present

## 2017-12-21 DIAGNOSIS — M25562 Pain in left knee: Secondary | ICD-10-CM | POA: Diagnosis not present

## 2017-12-21 DIAGNOSIS — M545 Low back pain: Secondary | ICD-10-CM | POA: Diagnosis not present

## 2017-12-21 DIAGNOSIS — M25561 Pain in right knee: Secondary | ICD-10-CM | POA: Diagnosis not present

## 2017-12-23 DIAGNOSIS — M25562 Pain in left knee: Secondary | ICD-10-CM | POA: Diagnosis not present

## 2017-12-23 DIAGNOSIS — M545 Low back pain: Secondary | ICD-10-CM | POA: Diagnosis not present

## 2017-12-23 DIAGNOSIS — M25561 Pain in right knee: Secondary | ICD-10-CM | POA: Diagnosis not present

## 2017-12-23 DIAGNOSIS — M170 Bilateral primary osteoarthritis of knee: Secondary | ICD-10-CM | POA: Diagnosis not present

## 2017-12-28 DIAGNOSIS — M25561 Pain in right knee: Secondary | ICD-10-CM | POA: Diagnosis not present

## 2017-12-28 DIAGNOSIS — M545 Low back pain: Secondary | ICD-10-CM | POA: Diagnosis not present

## 2017-12-28 DIAGNOSIS — M170 Bilateral primary osteoarthritis of knee: Secondary | ICD-10-CM | POA: Diagnosis not present

## 2017-12-28 DIAGNOSIS — M25562 Pain in left knee: Secondary | ICD-10-CM | POA: Diagnosis not present

## 2017-12-30 DIAGNOSIS — M25561 Pain in right knee: Secondary | ICD-10-CM | POA: Diagnosis not present

## 2017-12-30 DIAGNOSIS — M170 Bilateral primary osteoarthritis of knee: Secondary | ICD-10-CM | POA: Diagnosis not present

## 2017-12-30 DIAGNOSIS — M25562 Pain in left knee: Secondary | ICD-10-CM | POA: Diagnosis not present

## 2017-12-30 DIAGNOSIS — M545 Low back pain: Secondary | ICD-10-CM | POA: Diagnosis not present

## 2018-01-06 DIAGNOSIS — M25561 Pain in right knee: Secondary | ICD-10-CM | POA: Diagnosis not present

## 2018-01-06 DIAGNOSIS — M170 Bilateral primary osteoarthritis of knee: Secondary | ICD-10-CM | POA: Diagnosis not present

## 2018-01-06 DIAGNOSIS — M25562 Pain in left knee: Secondary | ICD-10-CM | POA: Diagnosis not present

## 2018-01-06 DIAGNOSIS — M545 Low back pain: Secondary | ICD-10-CM | POA: Diagnosis not present

## 2018-01-11 DIAGNOSIS — M25561 Pain in right knee: Secondary | ICD-10-CM | POA: Diagnosis not present

## 2018-01-11 DIAGNOSIS — M25562 Pain in left knee: Secondary | ICD-10-CM | POA: Diagnosis not present

## 2018-01-11 DIAGNOSIS — M545 Low back pain: Secondary | ICD-10-CM | POA: Diagnosis not present

## 2018-01-11 DIAGNOSIS — M170 Bilateral primary osteoarthritis of knee: Secondary | ICD-10-CM | POA: Diagnosis not present

## 2018-01-13 DIAGNOSIS — M170 Bilateral primary osteoarthritis of knee: Secondary | ICD-10-CM | POA: Diagnosis not present

## 2018-01-13 DIAGNOSIS — M25561 Pain in right knee: Secondary | ICD-10-CM | POA: Diagnosis not present

## 2018-01-13 DIAGNOSIS — M545 Low back pain: Secondary | ICD-10-CM | POA: Diagnosis not present

## 2018-01-13 DIAGNOSIS — M25562 Pain in left knee: Secondary | ICD-10-CM | POA: Diagnosis not present

## 2018-05-03 DIAGNOSIS — E7849 Other hyperlipidemia: Secondary | ICD-10-CM | POA: Diagnosis not present

## 2018-05-05 DIAGNOSIS — M1712 Unilateral primary osteoarthritis, left knee: Secondary | ICD-10-CM | POA: Diagnosis not present

## 2018-05-05 DIAGNOSIS — M25561 Pain in right knee: Secondary | ICD-10-CM | POA: Diagnosis not present

## 2018-05-05 DIAGNOSIS — M1711 Unilateral primary osteoarthritis, right knee: Secondary | ICD-10-CM | POA: Diagnosis not present

## 2018-05-05 DIAGNOSIS — M25562 Pain in left knee: Secondary | ICD-10-CM | POA: Diagnosis not present

## 2018-06-01 ENCOUNTER — Emergency Department (HOSPITAL_COMMUNITY): Payer: Medicare Other

## 2018-06-01 ENCOUNTER — Other Ambulatory Visit: Payer: Self-pay

## 2018-06-01 ENCOUNTER — Observation Stay (HOSPITAL_COMMUNITY)
Admission: EM | Admit: 2018-06-01 | Discharge: 2018-06-02 | Disposition: A | Discharge status: Home / Self Care | Payer: Medicare Other | Attending: Internal Medicine | Admitting: Internal Medicine

## 2018-06-01 ENCOUNTER — Encounter (HOSPITAL_COMMUNITY): Payer: Self-pay

## 2018-06-01 DIAGNOSIS — Z79899 Other long term (current) drug therapy: Secondary | ICD-10-CM | POA: Insufficient documentation

## 2018-06-01 DIAGNOSIS — I119 Hypertensive heart disease without heart failure: Secondary | ICD-10-CM | POA: Insufficient documentation

## 2018-06-01 DIAGNOSIS — E785 Hyperlipidemia, unspecified: Secondary | ICD-10-CM | POA: Insufficient documentation

## 2018-06-01 DIAGNOSIS — E669 Obesity, unspecified: Secondary | ICD-10-CM | POA: Diagnosis not present

## 2018-06-01 DIAGNOSIS — R0789 Other chest pain: Principal | ICD-10-CM | POA: Insufficient documentation

## 2018-06-01 DIAGNOSIS — Z885 Allergy status to narcotic agent status: Secondary | ICD-10-CM | POA: Diagnosis not present

## 2018-06-01 DIAGNOSIS — I10 Essential (primary) hypertension: Secondary | ICD-10-CM | POA: Diagnosis not present

## 2018-06-01 DIAGNOSIS — R079 Chest pain, unspecified: Secondary | ICD-10-CM | POA: Diagnosis present

## 2018-06-01 DIAGNOSIS — R0609 Other forms of dyspnea: Secondary | ICD-10-CM

## 2018-06-01 DIAGNOSIS — I2510 Atherosclerotic heart disease of native coronary artery without angina pectoris: Secondary | ICD-10-CM | POA: Diagnosis not present

## 2018-06-01 DIAGNOSIS — R0602 Shortness of breath: Secondary | ICD-10-CM | POA: Insufficient documentation

## 2018-06-01 DIAGNOSIS — M1990 Unspecified osteoarthritis, unspecified site: Secondary | ICD-10-CM

## 2018-06-01 DIAGNOSIS — K219 Gastro-esophageal reflux disease without esophagitis: Secondary | ICD-10-CM | POA: Insufficient documentation

## 2018-06-01 DIAGNOSIS — Z6836 Body mass index (BMI) 36.0-36.9, adult: Secondary | ICD-10-CM | POA: Diagnosis not present

## 2018-06-01 HISTORY — DX: Gastro-esophageal reflux disease without esophagitis: K21.9

## 2018-06-01 HISTORY — DX: Diverticulosis of intestine, part unspecified, without perforation or abscess without bleeding: K57.90

## 2018-06-01 HISTORY — DX: Personal history of other diseases of the digestive system: Z87.19

## 2018-06-01 LAB — BASIC METABOLIC PANEL
Anion gap: 13 (ref 5–15)
BUN: 18 mg/dL (ref 8–23)
CALCIUM: 9.4 mg/dL (ref 8.9–10.3)
CO2: 23 mmol/L (ref 22–32)
Chloride: 103 mmol/L (ref 98–111)
Creatinine, Ser: 0.93 mg/dL (ref 0.44–1.00)
GFR, EST NON AFRICAN AMERICAN: 60 mL/min — AB (ref >60)
GLUCOSE: 100 mg/dL — AB (ref 70–99)
Potassium: 3.5 mmol/L (ref 3.5–5.1)
Sodium: 139 mmol/L (ref 135–145)

## 2018-06-01 LAB — CBC
HCT: 40.2 % (ref 36.0–46.0)
HEMOGLOBIN: 13 g/dL (ref 12.0–15.0)
MCH: 30 pg (ref 26.0–34.0)
MCHC: 32.3 g/dL (ref 30.0–36.0)
MCV: 92.8 fL (ref 80.0–100.0)
NRBC: 0 % (ref 0.0–0.2)
PLATELETS: 220 10*3/uL (ref 150–400)
RBC: 4.33 MIL/uL (ref 3.87–5.11)
RDW: 13.2 % (ref 11.5–15.5)
WBC: 5.8 10*3/uL (ref 4.0–10.5)

## 2018-06-01 LAB — I-STAT TROPONIN, ED: TROPONIN I, POC: 0.01 ng/mL (ref 0.00–0.08)

## 2018-06-01 LAB — TROPONIN I

## 2018-06-01 MED ORDER — ASPIRIN 81 MG PO CHEW
324.0000 mg | CHEWABLE_TABLET | Freq: Once | ORAL | Status: AC
  Administered 2018-06-01: 324 mg via ORAL
  Filled 2018-06-01: qty 4

## 2018-06-01 MED ORDER — ENOXAPARIN SODIUM 40 MG/0.4ML ~~LOC~~ SOLN
40.0000 mg | SUBCUTANEOUS | Status: DC
  Administered 2018-06-01: 40 mg via SUBCUTANEOUS
  Filled 2018-06-01: qty 0.4

## 2018-06-01 MED ORDER — HYDROCHLOROTHIAZIDE 25 MG PO TABS
25.0000 mg | ORAL_TABLET | Freq: Every day | ORAL | Status: DC
  Administered 2018-06-02: 25 mg via ORAL
  Filled 2018-06-01: qty 1

## 2018-06-01 MED ORDER — ACETAMINOPHEN 325 MG PO TABS: 650.0000 mg | ORAL_TABLET | ORAL | Status: DC | PRN

## 2018-06-01 MED ORDER — ONDANSETRON HCL 4 MG/2ML IJ SOLN: 4.0000 mg | Freq: Four times a day (QID) | INTRAMUSCULAR | Status: DC | PRN

## 2018-06-01 NOTE — H&P (Signed)
History and Physical  Chloe Holmes HMC:[id number] DOB: 1945-11-11 DOA: 06/01/2018  Referring physician: Hoover Brunette, ER physician PCP: Shon Baton, MD  Outpatient Specialists: None Patient coming from: Home & is able to ambulate without assistance  Chief Complaint: Dyspnea on exertion  HPI: Chloe Holmes is a 72 y.o. female with medical history significant for decreased exercise tolerance which she states is been going on for the past 2 to 4 weeks, but she does recall that she is been complaining to her PCP about this at the end of last year.  She feels like symptoms of gotten worse in the past few days.  She even with mild activity, she feels quite fatigued and a little bit short of breath.  No cough.  No chest pain and in itself.  No lightheadedness or dizziness.  Symptoms seem to be caused by activity and relieved with rest.  Patient called her PCP when symptoms worsened over the past few days who advised her to come into the emergency room.  ED Course: In the emergency room, lab work including blood pressure, heart rate, oxygen saturation, EKG and initial cardiac markers all stable.  Heart score of 4, so hospitalist called for further evaluation  Review of Systems: Patient seen in the emergency room. Pt complains of shortness of breath with activity, currently none  Pt denies any headaches, vision changes, dysphagia, chest pain, palpitations, shortness of breath at rest, wheeze, cough, abdominal pain, hematuria, dysuria, constipation, diarrhea, focal extremity numbness weakness or pain.  Review of systems are otherwise negative   Past Medical History:  Diagnosis Date  . Arthritis   . Hypertension    Past Surgical History:  Procedure Laterality Date  . CERVICAL DISC SURGERY    . DILATION AND CURETTAGE OF UTERUS    . ESOPHAGEAL MANOMETRY N/A 10/03/2013   Procedure: ESOPHAGEAL MANOMETRY (EM);  Surgeon: Jerene Bears, MD;  Location: [facility];  Service:  Gastroenterology;  Laterality: N/A;  . EYE SURGERY    . ORBITAL FRACTURE SURGERY      Social History:  reports that she has never smoked. She has never used smokeless tobacco. She reports that she drinks about 7.0 - 10.0 standard drinks of alcohol per week.  Nothing excessive usually 1 to 2 glasses of wine in the evening with dinner.  She reports that she does not use drugs.  Lives alone, ambulates without assistance   Allergies  Allergen Reactions  . Morphine Other (See Comments)    Hypotension    Family History  Problem Relation Age of Onset  . Heart disease Mother   . Cancer Mother        colon  . Colon cancer Mother   . Hypertension Father   . Heart disease Father   . Lung cancer Father   . Cancer Father        lung  . Stomach cancer Paternal Grandfather   . Esophageal cancer Neg Hx   . Rectal cancer Neg Hx      Prior to Admission medications   Medication Sig Start Date End Date Taking? Authorizing Provider  diclofenac (VOLTAREN) 50 MG EC tablet Take 50 mg by mouth daily.   Yes [provider]  hydrochlorothiazide (HYDRODIURIL) 25 MG tablet Take 25 mg by mouth daily.   Yes [provider]  Nebivolol HCl (BYSTOLIC) 20 MG TABS Take 20 mg by mouth daily.    Yes [provider]  polyethylene glycol (MIRALAX / GLYCOLAX) packet Take 17 g by  mouth daily.   Yes [provider]  Rosuvastatin Calcium (CRESTOR PO) Take 1 tablet by mouth every Wednesday.    Yes [provider]  TURMERIC PO Take 1 capsule by mouth daily.   Yes [provider]  esomeprazole (NEXIUM) 40 MG capsule Take 40 mg by mouth daily before breakfast.  10/31/11  [provider]    Physical Exam: BP (!) 153/75   Pulse (!) 54   Temp 98 F (36.7 C) (Oral)   Resp 18   Ht 5\' 5"  (1.651 m)   Wt 99.8 kg   SpO2 100%   BMI 36.61 kg/m   General: Alert and oriented x3, no acute distress Eyes: Sclera nonicteric, extra ocular movements are intact ENT:  Normocephalic, atraumatic, mucous memories are moist Neck: Supple, no JVD Cardiovascular: Regular rate and rhythm, S1-S2, very soft Respiratory: Clear to auscultation bilaterally Abdomen: Soft, nontender, nondistended, positive bowel sounds Skin: No skin breaks, tears or lesions Musculoskeletal: No clubbing or cyanosis or edema Psychiatric: Patient is appropriate, no evidence of psychoses Neurologic: No focal deficits          Labs on Admission:  Basic Metabolic Panel: Recent Labs  Lab 06/01/18 1214  NA 139  K 3.5  CL 103  CO2 23  GLUCOSE 100*  BUN 18  CREATININE 0.93  CALCIUM 9.4   Liver Function Tests: No results for input(s): AST, ALT, ALKPHOS, BILITOT, PROT, ALBUMIN in the last 168 hours. No results for input(s): LIPASE, AMYLASE in the last 168 hours. No results for input(s): AMMONIA in the last 168 hours. CBC: Recent Labs  Lab 06/01/18 1214  WBC 5.8  HGB 13.0  HCT 40.2  MCV 92.8  PLT 220   Cardiac Enzymes: No results for input(s): CKTOTAL, CKMB, CKMBINDEX, TROPONINI in the last 168 hours.  BNP (last 3 results) No results for input(s): BNP in the last 8760 hours.  ProBNP (last 3 results) No results for input(s): PROBNP in the last 8760 hours.  CBG: No results for input(s): GLUCAP in the last 168 hours.  Radiological Exams on Admission: Dg Chest 2 View  Result Date: 06/01/2018 CLINICAL DATA:  Chest pain and short of breath EXAM: CHEST - 2 VIEW COMPARISON:  None. FINDINGS: Cardiac enlargement without heart failure. Negative for infiltrate effusion or mass. Small calcified granuloma in the left upper lobe. No acute skeletal abnormality IMPRESSION: Mild cardiac enlargement without acute abnormality. Electronically Signed   By: Franchot Gallo M.D.   On: 06/01/2018 14:43    EKG: Normal sinus rhythm with nonspecific ST-T wave abnormalities  Assessment/Plan Present on Admission: . Chest pain: Unclear.  Patient's heart score is a 4.  Her symptoms may represent  some form of unstable angina.  Cycle enzymes.  Cardiology notified and will plan for stress test tomorrow.  N.p.o. after midnight . Essential hypertension: Stable, continue diuretic, hold beta-blocker until after stress test . Arthritis: On Cox 2 inhibitor . Obesity (BMI 30-39.9): Meets criteria with BMI greater than 30  Active Problems:   Essential hypertension   Arthritis   Chest pain   Obesity (BMI 30-39.9)   DVT prophylaxis: Lovenox  Code Status: Full code  Family Communication: Patient declined for me to call family  Disposition Plan: Potential discharge tomorrow if stress test negative  Consults called: Cardiology  Admission status: Given likelihood patient may be discharged tomorrow, will place under observation    Annita Brod MD [facility] Pager [phone]  If 7PM-7AM, please contact night-coverage [email or web address] Password [email or web address]  06/01/2018, 4:15 PM

## 2018-06-01 NOTE — ED Provider Notes (Signed)
[place] EMERGENCY DEPARTMENT Provider Note   CSN: [id number] Arrival date & time: 06/01/18  1141     History   Chief Complaint Chief Complaint  Patient presents with  . Shortness of Breath    HPI Chloe Holmes is a 72 y.o. female.  HPI Shortness of breath with minimal exertion for the past 10 days accompanied by feeling of tightness in her chest "like my bra is too tight even though I am not wearing a bra" symptoms resolve with rest.  She called her primary care physician this morning who advised her to come to the emergency department.  She is presently asymptomatic.  She denies nausea lightheadedness or sweatiness.  No treatment prior to coming here. Past Medical History:  Diagnosis Date  . Arthritis   . Hypertension   Hyperlipidemia Patient Active Problem List   Diagnosis Date Noted  . 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 Fibroid uterus 11/24/2013  . Arthritis   . Hypertension   . HYPERTENSION 03/30/2009  . Esophageal reflux 03/30/2009  . DIVERTICULOSIS-COLON 03/30/2009  . DYSPHAGIA 03/30/2009  . ESOPHAGEAL STRICTURE 03/29/2009  . HIATAL HERNIA WITH REFLUX 03/29/2009  . DYSPHAGIA UNSPECIFIED 03/29/2009    Past Surgical History:  Procedure Laterality Date  . CERVICAL DISC SURGERY    . DILATION AND CURETTAGE OF UTERUS    . ESOPHAGEAL MANOMETRY N/A 10/03/2013   Procedure: ESOPHAGEAL MANOMETRY (EM);  Surgeon: Jerene Bears, MD;  Location: [facility];  Service: Gastroenterology;  Laterality: N/A;  . EYE SURGERY    . ORBITAL FRACTURE SURGERY       OB History    Gravida  2   Para  2   Term  2   Preterm      AB      Living  2     SAB      TAB      Ectopic      Multiple      Live Births               Home Medications    Prior to Admission medications   Medication Sig Start Date End Date Taking? Authorizing Provider  diclofenac (VOLTAREN) 50 MG EC tablet Take 50 mg by mouth daily.   Yes [provider]    hydrochlorothiazide (HYDRODIURIL) 25 MG tablet Take 25 mg by mouth daily.   Yes [provider]  Nebivolol HCl (BYSTOLIC) 20 MG TABS Take 20 mg by mouth daily.    Yes [provider]  polyethylene glycol (MIRALAX / GLYCOLAX) packet Take 17 g by mouth daily.   Yes [provider]  Rosuvastatin Calcium (CRESTOR PO) Take 1 tablet by mouth every Wednesday.    Yes [provider]  TURMERIC PO Take 1 capsule by mouth daily.   Yes [provider]  dexlansoprazole (DEXILANT) 60 MG capsule Take 1 capsule (60 mg total) by mouth daily. Patient not taking: Reported on 06/01/2018 10/17/13   Jerene Bears, MD  esomeprazole (NEXIUM) 40 MG capsule Take 40 mg by mouth daily before breakfast.  10/31/11  [provider]    Family History Family History  Problem Relation Age of Onset  . Heart disease Mother   . Cancer Mother        colon  . Colon cancer Mother   . Hypertension Father   . Heart disease Father   . Lung cancer Father   . Cancer Father  lung  . Stomach cancer Paternal Grandfather   . Esophageal cancer Neg Hx   . Rectal cancer Neg Hx   Mother noted to have heart disease in her 25s  Social History Social History   Tobacco Use  . Smoking status: Never Smoker  . Smokeless tobacco: Never Used  Substance Use Topics  . Alcohol use: Yes    Alcohol/week: 7.0 - 10.0 standard drinks    Types: 7 - 10 Glasses of wine per week  . Drug use: No     Allergies   Morphine   Review of Systems Review of Systems  Constitutional: Negative.   HENT: Negative.   Respiratory: Positive for shortness of breath.   Cardiovascular: Positive for chest pain.  Gastrointestinal: Negative.   Musculoskeletal: Negative.   Skin: Negative.   Neurological: Negative.   Psychiatric/Behavioral: Negative.   All other systems reviewed and are negative.    Physical Exam Updated Vital Signs BP (!) 153/75   Pulse (!) 54   Temp 98 F (36.7 C) (Oral)    Resp 18   Ht 5\' 5"  (1.651 m)   Wt 99.8 kg   SpO2 100%   BMI 36.61 kg/m   Physical Exam  Constitutional: She appears well-developed and well-nourished.  HENT:  Head: Normocephalic and atraumatic.  Eyes: Pupils are equal, round, and reactive to light. Conjunctivae are normal.  Neck: Neck supple. No tracheal deviation present. No thyromegaly present.  Cardiovascular: Normal rate and regular rhythm.  No murmur heard. Pulmonary/Chest: Effort normal and breath sounds normal.  Abdominal: Soft. Bowel sounds are normal. She exhibits no distension. There is no tenderness.  Obese  Musculoskeletal: Normal range of motion. She exhibits no edema or tenderness.  Neurological: She is alert. Coordination normal.  Skin: Skin is warm and dry. No rash noted.  Psychiatric: She has a normal mood and affect.  Nursing note and vitals reviewed.    ED Treatments / Results  Labs (all labs ordered are listed, but only abnormal results are displayed) Labs Reviewed  BASIC METABOLIC PANEL - Abnormal; Notable for the following components:      Result Value   Glucose, Bld 100 (*)    GFR calc non Af Amer 60 (*)    All other components within normal limits  CBC  I-STAT TROPONIN, ED    EKG EKG Interpretation  Date/Time:  Tuesday June 01 2018 11:44:24 EDT Ventricular Rate:  76 PR Interval:  136 QRS Duration: 88 QT Interval:  392 QTC Calculation: 441 R Axis:   27 Text Interpretation:  Sinus rhythm with occasional Premature ventricular complexes Possible Left atrial enlargement Nonspecific ST and T wave abnormality Abnormal ECG No significant change since last tracing Confirmed by Orlie Dakin [id number]) on 06/01/2018 3:00:57 PM  Chest x-ray viewed by me Radiology Dg Chest 2 View  Result Date: 06/01/2018 CLINICAL DATA:  Chest pain and short of breath EXAM: CHEST - 2 VIEW COMPARISON:  None. FINDINGS: Cardiac enlargement without heart failure. Negative for infiltrate effusion or mass. Small calcified  granuloma in the left upper lobe. No acute skeletal abnormality IMPRESSION: Mild cardiac enlargement without acute abnormality. Electronically Signed   By: Franchot Gallo M.D.   On: 06/01/2018 14:43    Procedures Procedures (including critical care time)  Medications Ordered in ED Medications  aspirin chewable tablet 324 mg (has no administration in time range)   Results for orders placed or performed during the hospital encounter of 93/79/02  Basic metabolic panel  Result Value Ref Range  Sodium 139 135 - 145 mmol/L   Potassium 3.5 3.5 - 5.1 mmol/L   Chloride 103 98 - 111 mmol/L   CO2 23 22 - 32 mmol/L   Glucose, Bld 100 (H) 70 - 99 mg/dL   BUN 18 8 - 23 mg/dL   Creatinine, Ser 0.93 0.44 - 1.00 mg/dL   Calcium 9.4 8.9 - 10.3 mg/dL   GFR calc non Af Amer 60 (L) >60 mL/min   GFR calc Af Amer >60 >60 mL/min   Anion gap 13 5 - 15  CBC  Result Value Ref Range   WBC 5.8 4.0 - 10.5 K/uL   RBC 4.33 3.87 - 5.11 MIL/uL   Hemoglobin 13.0 12.0 - 15.0 g/dL   HCT 40.2 36.0 - 46.0 %   MCV 92.8 80.0 - 100.0 fL   MCH 30.0 26.0 - 34.0 pg   MCHC 32.3 30.0 - 36.0 g/dL   RDW 13.2 11.5 - 15.5 %   Platelets 220 150 - 400 K/uL   nRBC 0.0 0.0 - 0.2 %  I-stat troponin, ED  Result Value Ref Range   Troponin i, poc 0.01 0.00 - 0.08 ng/mL   Comment 3           Dg Chest 2 View  Result Date: 06/01/2018 CLINICAL DATA:  Chest pain and short of breath EXAM: CHEST - 2 VIEW COMPARISON:  None. FINDINGS: Cardiac enlargement without heart failure. Negative for infiltrate effusion or mass. Small calcified granuloma in the left upper lobe. No acute skeletal abnormality IMPRESSION: Mild cardiac enlargement without acute abnormality. Electronically Signed   By: Franchot Gallo M.D.   On: 06/01/2018 14:43    Initial Impression / Assessment and Plan / ED Course  I have reviewed the triage vital signs and the nursing notes.  Pertinent labs & imaging results that were available during my care of the patient were  reviewed by me and considered in my medical decision making (see chart for details).     Heart score equals 6 based on risk factors, EKG age and history.  Concern for angina. Aspirin ordered Lab work is unremarkable  Dr Chauncey Cruel. Clide Deutscher consulted, will see patient in the emergency department and arrange for overnight stay Final Clinical Impressions(s) / ED Diagnoses  Dx #1 dyspnea on exertion #2 chest pain on exertion Final diagnoses:  None    ED Discharge Orders    None       Orlie Dakin, MD 06/01/18 1548

## 2018-06-01 NOTE — ED Triage Notes (Signed)
Pt states that she has been having SOB with tightness in her chest, states that she takes a statin once a week and the days that she takes it she feels bad and weak. SOB X 10 days.

## 2018-06-02 ENCOUNTER — Encounter: Payer: Self-pay | Admitting: Physician Assistant

## 2018-06-02 ENCOUNTER — Observation Stay (HOSPITAL_COMMUNITY): Payer: Medicare Other

## 2018-06-02 DIAGNOSIS — R0789 Other chest pain: Secondary | ICD-10-CM | POA: Diagnosis not present

## 2018-06-02 DIAGNOSIS — I10 Essential (primary) hypertension: Secondary | ICD-10-CM | POA: Diagnosis not present

## 2018-06-02 DIAGNOSIS — R0609 Other forms of dyspnea: Secondary | ICD-10-CM | POA: Diagnosis not present

## 2018-06-02 DIAGNOSIS — E669 Obesity, unspecified: Secondary | ICD-10-CM | POA: Diagnosis not present

## 2018-06-02 DIAGNOSIS — R079 Chest pain, unspecified: Secondary | ICD-10-CM | POA: Diagnosis not present

## 2018-06-02 DIAGNOSIS — I493 Ventricular premature depolarization: Secondary | ICD-10-CM

## 2018-06-02 DIAGNOSIS — R0600 Dyspnea, unspecified: Secondary | ICD-10-CM | POA: Diagnosis not present

## 2018-06-02 LAB — TROPONIN I

## 2018-06-02 MED ORDER — REGADENOSON 0.4 MG/5ML IV SOLN
INTRAVENOUS | Status: AC
  Filled 2018-06-02: qty 5

## 2018-06-02 MED ORDER — NEBIVOLOL HCL 10 MG PO TABS
20.0000 mg | ORAL_TABLET | Freq: Every day | ORAL | Status: DC
  Administered 2018-06-02: 20 mg via ORAL
  Filled 2018-06-02: qty 2

## 2018-06-02 MED ORDER — TECHNETIUM TC 99M TETROFOSMIN IV KIT
30.0000 | PACK | Freq: Once | INTRAVENOUS | Status: AC | PRN
  Administered 2018-06-02: 30 via INTRAVENOUS

## 2018-06-02 MED ORDER — TECHNETIUM TC 99M TETROFOSMIN IV KIT
10.0000 | PACK | Freq: Once | INTRAVENOUS | Status: AC | PRN
  Administered 2018-06-02: 10 via INTRAVENOUS

## 2018-06-02 MED ORDER — REGADENOSON 0.4 MG/5ML IV SOLN
0.4000 mg | Freq: Once | INTRAVENOUS | Status: AC
  Administered 2018-06-02: 0.4 mg via INTRAVENOUS

## 2018-06-02 NOTE — Progress Notes (Signed)
Pt transported for stress test

## 2018-06-02 NOTE — Progress Notes (Signed)
I called down to nuc med for update on nucs as I had not yet been called to proctor any studies. Nuc med had run the study per protocol given it was classified as low risk. We had not been contacted to enter the Cupid EKG portion prior to it being processed. I was covering [facility] today so had asked colleague to help cover nucs at [facility]. Ellen Henri PA-C located the EKGs to manually enter interpretation but she was unable to access Cupid because it was already being read by radiology. Vitals were stable during test per records. Stress test was normal and Dr. Harrell Gave made additional recs in her note. As Chloe Holmes knows this patient, she stated she will be looking at the chart to f/u on any additional needs.  Our team manager will be reaching out to the nuc med team to reinforce protocol to notify call APP even if we do not have to personally attend the study so that we can make sure data is fully up to date prior to image processing.   Jaydalee Bardwell PA-C

## 2018-06-02 NOTE — Consult Note (Signed)
Cardiology Consultation:   Patient ID: Chloe Holmes MRN: [id number]; DOB: Mar 09, 1946  Admit date: 06/01/2018 Date of Consult: 06/02/2018  Primary Care Provider: Shon Baton, MD Primary Cardiologist: New (Dr. Harrell Gave)  Patient Profile:   Chloe Holmes is a 72 y.o. female, retired Warden/ranger, with a hx of HTN, HLD, recent findings of coronary calcifications on coronary calcium test (LAD and LCx territories), hiatal hernia and GERD, who is being seen today for the evaluation of exertional dyspnea at the request of Dr. Eliseo Squires, Internal Medicine.  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 Use to work here at [employer].  She was recently discovered to have abnormal coronary calcium test 12/17/17, ordered by her PCP. Coronary calcium score was 77 and calcifications were most notable in the LAD and also present in the LCx artery. Additional cardiac risk factors include HTN and HLD. Both conditions treated and are being managed by her PCP. Home meds include HCTZ, Bystolic and Crestor, however pt notes intolerance to statins. She was started on Crestor in April when coronary calcifications were noted, but did not tolerate daily dosing. Pt noted significant myalgias and arrhthymias (frequent bigeminy and trigeminy, per pt report, while taking Crestor daily). Her PCP reduced her down to once weekly and symptoms improved. Still with side effects, but able to tolerate.  She also has a family h/o CAD (mother with MI in her early 61s). Pt also has a h/o hiatal hernia and GERD.   Pt presented to the [facility] ED last night with CC of worsening exertional dyspnea. Symptoms occurring over the last 2-4 weeks but have worsened over the last few days. Pt notified PCP and was subsequently referred to the ED. Symptoms occur with exertion and are relieved with rest. Also exertional fatigue. Decreased exercise tolerance. No exertional CP but she has had some occasional chest  tightness, occurring off and on, with no particular pattern over the last several months, however is not exacerbated by activity. She denies any associated dizziness, syncope/ near syncope. No LEE, orthopnea or PND.   ED assessment was unremarkable. CXR shows mild cardiac enlargement w/o acute abnormality. EKG shows NSR with 1 PVC and nonspecific ST and Twave abnormalities. No baseline EKGs for comparison. Troponin's are negative x 3. CBC WNL. No anemia. Hgb 13.  BMP unremarkable. No BNP collected. Pt admitted by IM. Cardiology asked to evaluate.   Pt currently resting comfortably in bed. Tele shows sinus bradycardia in the upper 40s-low 50s but pt asymptomatic. She reports her usual baseline HR is in the mid to upper 50s.   Past Medical History:  Diagnosis Date  . Arthritis   . Diverticulosis   . GERD (gastroesophageal reflux disease)   . History of hiatal hernia   . Hypertension     Past Surgical History:  Procedure Laterality Date  . ABDOMINAL HYSTERECTOMY    . CERVICAL DISC SURGERY    . COLONOSCOPY  2016  . DILATION AND CURETTAGE OF UTERUS    . ESOPHAGEAL MANOMETRY N/A 10/03/2013   Procedure: ESOPHAGEAL MANOMETRY (EM);  Surgeon: Jerene Bears, MD;  Location: [facility];  Service: Gastroenterology;  Laterality: N/A;  . EYE SURGERY    . ORBITAL FRACTURE SURGERY       Home Medications:  Prior to Admission medications   Medication Sig Start Date End Date Taking? Authorizing Provider  diclofenac (VOLTAREN) 50 MG EC tablet Take 50 mg by mouth daily.   Yes [provider]  hydrochlorothiazide (HYDRODIURIL) 25 MG tablet Take 25 mg by mouth daily.   Yes [provider]  Nebivolol HCl (BYSTOLIC) 20 MG TABS Take 20 mg by mouth daily.    Yes [provider]  polyethylene glycol (MIRALAX / GLYCOLAX) packet Take 17 g by mouth daily.   Yes [provider]  Rosuvastatin Calcium (CRESTOR PO) Take 1 tablet by mouth every Wednesday.    Yes [provider]  TURMERIC PO Take 1 capsule by mouth daily.   Yes [provider]  esomeprazole (NEXIUM) 40 MG capsule Take 40 mg by mouth daily before breakfast.  10/31/11  [provider]    Inpatient Medications: Scheduled Meds: . enoxaparin (LOVENOX) injection  40 mg Subcutaneous Q24H  . hydrochlorothiazide  25 mg Oral Daily   Continuous Infusions:  PRN Meds: acetaminophen, ondansetron (ZOFRAN) IV  Allergies:    Allergies  Allergen Reactions  . Morphine Other (See Comments)    Hypotension    Social History:   Social History   Socioeconomic History  . Marital status: Divorced    Spouse name: Not on file  . Number of children: Not on file  . Years of education: Not on file  . Highest education level: Not on file  Occupational History  . Not on file  Social Needs  . Financial resource strain: Not on file  . Food insecurity:    Worry: Not on file    Inability: Not on file  . Transportation needs:    Medical: Not on file    Non-medical: Not on file  Tobacco Use  . Smoking status: Never Smoker  . Smokeless tobacco: Never Used  Substance and Sexual Activity  . Alcohol use: Yes    Alcohol/week: 7.0 - 10.0 standard drinks    Types: 7 - 10 Glasses of wine per week  . Drug use: No  . Sexual activity: Not Currently    Birth control/protection: Post-menopausal  Lifestyle  . Physical activity:    Days per week: Not on file    Minutes per session: Not on file  . Stress: Not on file  Relationships  . Social connections:    Talks on phone: Not on file    Gets together: Not on file    Attends religious service: Not on file    Active member of club or organization: Not on file    Attends meetings of clubs or organizations: Not on file    Relationship status: Not on file  . Intimate partner violence:    Fear of current or ex partner: Not on file    Emotionally abused: Not on file    Physically abused: Not on file    Forced sexual activity: Not on file  Other  Topics Concern  . Not on file  Social History Narrative  . Not on file    Family History:    Family History  Problem Relation Age of Onset  . Heart disease Mother   . Cancer Mother        colon  . Colon cancer Mother   . Hypertension Father   . Heart disease Father   . Lung cancer Father   . Cancer Father        lung  . Stomach cancer Paternal Grandfather   . Esophageal cancer Neg Hx   . Rectal cancer Neg Hx      ROS:  Please see the history of present illness.   All other ROS reviewed and  negative.     Physical Exam/Data:   Vitals:   06/01/18 1632 06/01/18 1840 06/01/18 2350 06/02/18 0600  BP: (!) 163/93 140/82 (!) 145/86 137/73  Pulse: 64 61 60 (!) 55  Resp: 18 18 16 16   Temp: 98.2 F (36.8 C) 98.4 F (36.9 C) 98.2 F (36.8 C) 98.5 F (36.9 C)  TempSrc: Oral Oral Oral Oral  SpO2: 100% 98% 99% 99%  Weight:      Height:       No intake or output data in the 24 hours ending 06/02/18 0743 Filed Weights   06/01/18 1156  Weight: 99.8 kg   Body mass index is 36.61 kg/m.  General:  Moderately obese, well nourished, well developed, WF in no acute distress HEENT: normal Lymph: no adenopathy Neck: no JVD Endocrine:  No thryomegaly Vascular: No carotid bruits; FA pulses 2+ bilaterally without bruits  Cardiac:  normal S1, S2; regular rhythm, bradycardia; no murmur  Lungs:  clear to auscultation bilaterally, no wheezing, rhonchi or rales  Abd: soft, nontender, no hepatomegaly  Ext: no edema Musculoskeletal:  No deformities, BUE and BLE strength normal and equal Skin: warm and dry  Neuro:  CNs 2-12 intact, no focal abnormalities noted Psych:  Normal affect   EKG:  The EKG was personally reviewed and demonstrates:  NSR 1 PVC, nonspecific ST and Twave abnormalities. No prior EKGs for comparison.  Telemetry:  Telemetry was personally reviewed and demonstrates:  Sinus brady, upper 40-s50s, occasional PVCs  Relevant CV Studies: Calcium Score Test  11/2017 FINDINGS: Technical quality: Good.  CORONARY CALCIUM  Total Agatston Score: 77 with calcifications most notable in the left anterior descending coronary artery, also present in the left circumflex coronary artery.  MESA database percentile:  10  OTHER FINDINGS:  Cardiovascular: Heart is borderline in size. Visualized aorta is normal caliber.  Mediastinum/Nodes: No adenopathy in the lower mediastinum or hila.  Lungs/Pleura: Ground-glass and linear interstitial thickening noted dependently in the right lower lobe and in the lung bases, favor scarring. No confluent opacities otherwise. No effusions.  Upper Abdomen: Imaging into the upper abdomen shows no acute findings.  Musculoskeletal: Chest wall soft tissues are unremarkable. No acute bony abnormality.  IMPRESSION: The observed calcium score of 77 is at the percentile 63 for subjects of the same age, gender and race/ethnicity who are free of clinical cardiovascular disease and treated diabetes.  Areas of probable scarring in the lower lobes bilaterally. No acute extra cardiac abnormality.  Laboratory Data:  Chemistry Recent Labs  Lab 06/01/18 1214  NA 139  K 3.5  CL 103  CO2 23  GLUCOSE 100*  BUN 18  CREATININE 0.93  CALCIUM 9.4  GFRNONAA 60*  GFRAA >60  ANIONGAP 13    No results for input(s): PROT, ALBUMIN, AST, ALT, ALKPHOS, BILITOT in the last 168 hours. Hematology Recent Labs  Lab 06/01/18 1214  WBC 5.8  RBC 4.33  HGB 13.0  HCT 40.2  MCV 92.8  MCH 30.0  MCHC 32.3  RDW 13.2  PLT 220   Cardiac Enzymes Recent Labs  Lab 06/01/18 1913 06/02/18 0032  TROPONINI <0.03 <0.03    Recent Labs  Lab 06/01/18 1226  TROPIPOC 0.01    BNPNo results for input(s): BNP, PROBNP in the last 168 hours.  DDimer No results for input(s): DDIMER in the last 168 hours.  Radiology/Studies:  Dg Chest 2 View  Result Date: 06/01/2018 CLINICAL DATA:  Chest pain and short of breath EXAM:  CHEST - 2 VIEW COMPARISON:  None. FINDINGS: Cardiac enlargement without heart failure. Negative for infiltrate effusion or mass. Small calcified granuloma in the left upper lobe. No acute skeletal abnormality IMPRESSION: Mild cardiac enlargement without acute abnormality. Electronically Signed   By: Franchot Gallo M.D.   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 Eliseo Squires, Internal Medicine.  1. Exertional Dyspnea and Fatigue: CXR shows mild cardiac enlargement w/o acute abnormality. No signs of fluid overload. Lung exam normal. CBC normal. No anemia. Hgb 13. EKG shows NSR with 1 PVC. No baseline for comparison. Tele is notable for sinus bradycardia in the upper 40s- upper 50s with occasional PVCs. ? Symptomatic bradycardia. She also has evidence of coronary artery disease, based on recent coronary calcium score test (see below). Recommend additional ischemic testing to rule out coronary ischemia as the etiology of her symptoms. Will also order a 2D echo to assess LVEF and r/o structural abnormalities.  If our inpatient w/u is negative, then we can consider outpatient cardiac monitor for further evaluation to better assess HR and check PVC burden. Will also check TSH today.   2. CAD: She was recently discovered to have abnormal coronary calcium test 12/17/17, ordered by her PCP. Coronary calcium score was 77 and calcifications were most notable in the LAD and also present in the LCx artery. Given exertional dyspnea and fatigue, we will plan additional cardiac testing to r/o coronary ischemia. Given her resting HR in the upper 40s-50s, she would be a good candidate for coronary CTA w/ morphology to better define her anatomy.  For now, would continue her  blocker and statin.   3. HTN: controlled on current regimen, 137/73 today. Continue management per PCP.   4. HLD: on statin therapy w/ Crestor, but only once weekly due to intolerance with daily dosing. Per pt report, she had a recent FLP at PCP last month and was told that her LDL had improved with Crestor and at goal.    For questions or updates, please contact [facility] Please consult [email or web address] for contact info under     Signed, Lyda Jester, PA-C  06/02/2018 7:43 AM

## 2018-06-02 NOTE — Discharge Summary (Signed)
Physician Discharge Summary  Sapir Lavey MGQ:[id number] DOB: 1946-05-09 DOA: 06/01/2018  PCP: Shon Baton, MD  Admit date: 06/01/2018 Discharge date: 06/02/2018  Admitted From: home Discharge disposition: home   Recommendations for Outpatient Follow-Up:   Cardiology: suggested that she speak with her cardiologist about trying statins from other classes if she cannot tolerate rosuvastatin. She does not think she has been on any other statins. If she truly cannot tolerate a statin such as pravastatin, would consider ezetimibe or another non-statin option for prevention.  Her exertional symptoms also sound worse when her PVC burden is higher. Could consider placing Zio/holter monitor to determine her overall PVC burden and see if adjusting her beta blocker improves her symptoms.   Discharge Diagnosis:   Active Problems:   Essential hypertension   Arthritis   Chest pain   Obesity (BMI 30-39.9)    Discharge Condition: Improved.  Diet recommendation: Low sodium, heart healthy  Wound care: None.  Code status: Full.   History of Present Illness:  Bulah Lurie is a 72 y.o. female with medical history significant for decreased exercise tolerance which she states is been going on for the past 2 to 4 weeks, but she does recall that she is been complaining to her PCP about this at the end of last year.  She feels like symptoms of gotten worse in the past few days.  She even with mild activity, she feels quite fatigued and a little bit short of breath.  No cough.  No chest pain and in itself.  No lightheadedness or dizziness.  Symptoms seem to be caused by activity and relieved with rest.  Patient called her PCP when symptoms worsened over the past few days who advised her to come into the emergency room.   Hospital Course by Problem:    Chest pain:  -appreciate cardiology consult Performed lexiscan myoview today which did not show any ischemia.  Discussed  results at bedside with patient. Discussed how we estimate cardiac risk, etc. I suggested that she speak with her cardiologist about trying statins from other classes if she cannot tolerate rosuvastatin. She does not think she has been on any other statins. If she truly cannot tolerate a statin such as pravastatin, would consider ezetimibe or another non-statin option for prevention.  Her exertional symptoms also sound worse when her PVC burden is higher. Could consider placing Zio/holter monitor to determine her overall PVC burden and see if adjusting her beta blocker improves her symptoms.  -doubt PE as not pleurtic and no LE symptoms-- no hypoxia -?GI-- follow up with Dr. Hilarie Fredrickson  Essential hypertension: resume home meds -see above regarding discussion about titration of BB after holter monitor  Arthritis: On Cox 2 inhibitor  Obesity (BMI 30-39.9): Meets criteria with BMI greater than 30 -encouraged weight loss       Medical Consultants:   cards   Discharge Exam:   Vitals:   06/02/18 1040 06/02/18 1211  BP: 123/84 (!) 142/77  Pulse:  60  Resp:  16  Temp:  98.1 F (36.7 C)  SpO2:  98%   Vitals:   06/02/18 1036 06/02/18 1038 06/02/18 1040 06/02/18 1211  BP: (!) 155/94 129/89 123/84 (!) 142/77  Pulse:    60  Resp:    16  Temp:    98.1 F (36.7 C)  TempSrc:    Oral  SpO2:    98%  Weight:      Height:  General exam: Appears calm and comfortable.   The results of significant diagnostics from this hospitalization (including imaging, microbiology, ancillary and laboratory) are listed below for reference.     Procedures and Diagnostic Studies:   Dg Chest 2 View  Result Date: 06/01/2018 CLINICAL DATA:  Chest pain and short of breath EXAM: CHEST - 2 VIEW COMPARISON:  None. FINDINGS: Cardiac enlargement without heart failure. Negative for infiltrate effusion or mass. Small calcified granuloma in the left upper lobe. No acute skeletal abnormality IMPRESSION:  Mild cardiac enlargement without acute abnormality. Electronically Signed   By: Franchot Gallo M.D.   On: 06/01/2018 14:43   Nm Myocar Multi W/spect W/wall Motion / Ef  Result Date: 06/02/2018 CLINICAL DATA:  Dyspnea with worsening effort tolerance. EXAM: MYOCARDIAL IMAGING WITH SPECT (REST AND PHARMACOLOGIC-STRESS) GATED LEFT VENTRICULAR WALL MOTION STUDY LEFT VENTRICULAR EJECTION FRACTION TECHNIQUE: Standard myocardial SPECT imaging was performed after resting intravenous injection of 10 mCi Tc-82m tetrofosmin. Subsequently, intravenous infusion of Lexiscan was performed under the supervision of the Cardiology staff. At peak effect of the drug, 30 mCi Tc-76m tetrofosmin was injected intravenously and standard myocardial SPECT imaging was performed. Quantitative gated imaging was also performed to evaluate left ventricular wall motion, and estimate left ventricular ejection fraction. COMPARISON:  Two-view chest x-ray 06/01/2018 FINDINGS: Perfusion: No decreased activity in the left ventricle on stress imaging to suggest reversible ischemia or infarction. Wall Motion: Normal left ventricular wall motion. No left ventricular dilation. Left Ventricular Ejection Fraction: 63 % End diastolic volume 92 ml End systolic volume 34 ml IMPRESSION: 1. No reversible ischemia or infarction. 2. Normal left ventricular wall motion. 3. Left ventricular ejection fraction 63% 4. Non invasive risk stratification*: Low *2012 Appropriate Use Criteria for Coronary Revascularization Focused Update: J Am Coll Cardiol. 9937;16(9):678-938. http://content.[email or web address]?articleid=[email or web address] Electronically Signed   By: San Morelle M.D.   On: 06/02/2018 12:49     Labs:   Basic Metabolic Panel: Recent Labs  Lab 06/01/18 1214  NA 139  K 3.5  CL 103  CO2 23  GLUCOSE 100*  BUN 18  CREATININE 0.93  CALCIUM 9.4   GFR Estimated Creatinine Clearance: 63 mL/min (by C-G formula based on SCr of 0.93  mg/dL). Liver Function Tests: No results for input(s): AST, ALT, ALKPHOS, BILITOT, PROT, ALBUMIN in the last 168 hours. No results for input(s): LIPASE, AMYLASE in the last 168 hours. No results for input(s): AMMONIA in the last 168 hours. Coagulation profile No results for input(s): INR, PROTIME in the last 168 hours.  CBC: Recent Labs  Lab 06/01/18 1214  WBC 5.8  HGB 13.0  HCT 40.2  MCV 92.8  PLT 220   Cardiac Enzymes: Recent Labs  Lab 06/01/18 1913 06/02/18 0032  TROPONINI <0.03 <0.03   BNP: Invalid input(s): POCBNP CBG: No results for input(s): GLUCAP in the last 168 hours. D-Dimer No results for input(s): DDIMER in the last 72 hours. Hgb A1c No results for input(s): HGBA1C in the last 72 hours. Lipid Profile No results for input(s): CHOL, HDL, LDLCALC, TRIG, CHOLHDL, LDLDIRECT in the last 72 hours. Thyroid function studies No results for input(s): TSH, T4TOTAL, T3FREE, THYROIDAB in the last 72 hours.  Invalid input(s): FREET3 Anemia work up No results for input(s): VITAMINB12, FOLATE, FERRITIN, TIBC, IRON, RETICCTPCT in the last 72 hours. Microbiology No results found for this or any previous visit (from the past 240 hour(s)).   Discharge Instructions:   Discharge Instructions    Diet - low sodium heart healthy   Complete  by:  As directed    Discharge instructions   Complete by:  As directed    Cardiology recommendations: trying statins from other classes if she cannot tolerate rosuvastatin. Could consider placing Zio/holter monitor to determine PVC burden and see if adjusting her beta blocker improves her symptoms.   Increase activity slowly   Complete by:  As directed      Allergies as of 06/02/2018      Reactions   Morphine Other (See Comments)   Hypotension      Medication List    TAKE these medications   BYSTOLIC 20 MG Tabs Generic drug:  Nebivolol HCl Take 20 mg by mouth daily.   CRESTOR PO Take 1 tablet by mouth every Wednesday.    diclofenac 50 MG EC tablet Commonly known as:  VOLTAREN Take 50 mg by mouth daily.   hydrochlorothiazide 25 MG tablet Commonly known as:  HYDRODIURIL Take 25 mg by mouth daily.   polyethylene glycol packet Commonly known as:  MIRALAX / GLYCOLAX Take 17 g by mouth daily.   TURMERIC PO Take 1 capsule by mouth daily.         Time coordinating discharge: 25 min  Signed:  Geradine Girt  [facility] 06/02/2018, 2:22 PM

## 2018-06-07 DIAGNOSIS — E668 Other obesity: Secondary | ICD-10-CM | POA: Diagnosis not present

## 2018-06-07 DIAGNOSIS — I700 Atherosclerosis of aorta: Secondary | ICD-10-CM | POA: Diagnosis not present

## 2018-06-07 DIAGNOSIS — E7849 Other hyperlipidemia: Secondary | ICD-10-CM | POA: Diagnosis not present

## 2018-06-07 DIAGNOSIS — I77810 Thoracic aortic ectasia: Secondary | ICD-10-CM | POA: Diagnosis not present

## 2018-06-07 DIAGNOSIS — Z23 Encounter for immunization: Secondary | ICD-10-CM | POA: Diagnosis not present

## 2018-06-07 DIAGNOSIS — R002 Palpitations: Secondary | ICD-10-CM | POA: Diagnosis not present

## 2018-06-07 DIAGNOSIS — I10 Essential (primary) hypertension: Secondary | ICD-10-CM | POA: Diagnosis not present

## 2018-06-07 DIAGNOSIS — Z1389 Encounter for screening for other disorder: Secondary | ICD-10-CM | POA: Diagnosis not present

## 2018-06-07 DIAGNOSIS — I493 Ventricular premature depolarization: Secondary | ICD-10-CM | POA: Diagnosis not present

## 2018-06-07 DIAGNOSIS — Z6837 Body mass index (BMI) 37.0-37.9, adult: Secondary | ICD-10-CM | POA: Diagnosis not present

## 2018-06-21 DIAGNOSIS — M1712 Unilateral primary osteoarthritis, left knee: Secondary | ICD-10-CM | POA: Diagnosis not present

## 2018-06-21 DIAGNOSIS — M25561 Pain in right knee: Secondary | ICD-10-CM | POA: Diagnosis not present

## 2018-06-21 DIAGNOSIS — M25562 Pain in left knee: Secondary | ICD-10-CM | POA: Diagnosis not present

## 2018-07-01 DIAGNOSIS — M1712 Unilateral primary osteoarthritis, left knee: Secondary | ICD-10-CM | POA: Diagnosis not present

## 2018-07-08 DIAGNOSIS — L814 Other melanin hyperpigmentation: Secondary | ICD-10-CM | POA: Diagnosis not present

## 2018-07-08 DIAGNOSIS — L821 Other seborrheic keratosis: Secondary | ICD-10-CM | POA: Diagnosis not present

## 2018-07-08 DIAGNOSIS — M1712 Unilateral primary osteoarthritis, left knee: Secondary | ICD-10-CM | POA: Diagnosis not present

## 2018-07-08 DIAGNOSIS — L570 Actinic keratosis: Secondary | ICD-10-CM | POA: Diagnosis not present

## 2018-07-19 DIAGNOSIS — L570 Actinic keratosis: Secondary | ICD-10-CM | POA: Diagnosis not present

## 2018-07-19 DIAGNOSIS — K130 Diseases of lips: Secondary | ICD-10-CM | POA: Diagnosis not present

## 2018-08-30 DIAGNOSIS — M79671 Pain in right foot: Secondary | ICD-10-CM | POA: Diagnosis not present

## 2018-08-30 DIAGNOSIS — M19072 Primary osteoarthritis, left ankle and foot: Secondary | ICD-10-CM | POA: Diagnosis not present

## 2018-08-30 DIAGNOSIS — M79672 Pain in left foot: Secondary | ICD-10-CM | POA: Diagnosis not present

## 2018-09-01 DIAGNOSIS — M1712 Unilateral primary osteoarthritis, left knee: Secondary | ICD-10-CM | POA: Diagnosis not present

## 2018-09-01 DIAGNOSIS — M25562 Pain in left knee: Secondary | ICD-10-CM | POA: Diagnosis not present

## 2018-10-14 ENCOUNTER — Encounter (HOSPITAL_COMMUNITY): Admission: RE | Admit: 2018-10-14 | Payer: Medicare Other | Source: Ambulatory Visit

## 2018-10-29 NOTE — H&P (Signed)
TOTAL KNEE ADMISSION H&P  Patient is being admitted for left total knee arthroplasty.  Subjective:  Chief Complaint:  Left knee primary OA / pain  HPI: Chloe Holmes, 73 y.o. female, has a history of pain and functional disability in the left knee due to arthritis and has failed non-surgical conservative treatments for greater than 12 weeks to include NSAID's and/or analgesics, corticosteriod injections, viscosupplementation injections and activity modification.  Onset of symptoms was gradual, starting 2+ years ago with gradually worsening course since that time. The patient noted prior procedures on the knee to include  arthroscopy on the left knee(s).  Patient currently rates how much the knee bothers her regarding the left knee(s) at 8 out of 10 with activity, pain is not significant. Patient has worsening of pain with activity and weight bearing, pain that interferes with activities of daily living, pain with passive range of motion, crepitus and joint swelling.  Patient has evidence of periarticular osteophytes and joint space narrowing by imaging studies.  There is no active infection.  Risks, benefits and expectations were discussed with the patient.  Risks including but not limited to the risk of anesthesia, blood clots, nerve damage, blood vessel damage, failure of the prosthesis, infection and up to and including death.  Patient understand the risks, benefits and expectations and wishes to proceed with surgery.   PCP: Shon Baton, MD  D/C Plans:       Home  Post-op Meds:       No Rx given  Tranexamic Acid:      To be given - IV   Decadron:      Is to be given  FYI:     ASA  Norco  Fentantyl  DME:   Pt equipment arranged  PT:   PT arranged  Pharmacy:  [facility]   Patient Active Problem List   Diagnosis Date Noted  . Chest pain 06/01/2018  . Obesity (BMI 30-39.9) 06/01/2018  . 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 Fibroid uterus 11/24/2013  . Arthritis   . Hypertension    . Essential hypertension 03/30/2009  . Esophageal reflux 03/30/2009  . DIVERTICULOSIS-COLON 03/30/2009  . DYSPHAGIA 03/30/2009  . ESOPHAGEAL STRICTURE 03/29/2009  . HIATAL HERNIA WITH REFLUX 03/29/2009  . DYSPHAGIA UNSPECIFIED 03/29/2009   Past Medical History:  Diagnosis Date  . Arthritis   . Diverticulosis   . GERD (gastroesophageal reflux disease)   . History of hiatal hernia   . Hypertension     Past Surgical History:  Procedure Laterality Date  . ABDOMINAL HYSTERECTOMY    . CERVICAL DISC SURGERY    . COLONOSCOPY  2016  . DILATION AND CURETTAGE OF UTERUS    . ESOPHAGEAL MANOMETRY N/A 10/03/2013   Procedure: ESOPHAGEAL MANOMETRY (EM);  Surgeon: Jerene Bears, MD;  Location: [facility];  Service: Gastroenterology;  Laterality: N/A;  . EYE SURGERY    . ORBITAL FRACTURE SURGERY      No current facility-administered medications for this encounter.    Current Outpatient Medications  Medication Sig Dispense Refill Last Dose  . atorvastatin (LIPITOR) 10 MG tablet Take 10 mg by mouth 2 (two) times a week. Wednesday and Saturday     . diclofenac (VOLTAREN) 50 MG EC tablet Take 50 mg by mouth 2 (two) times daily as needed for moderate pain.    06/01/2018 at Unknown time  . diclofenac sodium (VOLTAREN) 1 % GEL Apply 2 g topically daily as needed (pain).     . eszopiclone (LUNESTA)  1 MG TABS tablet Take 1 mg by mouth at bedtime as needed for sleep. Take immediately before bedtime     . hydrochlorothiazide (HYDRODIURIL) 25 MG tablet Take 25 mg by mouth daily.   06/01/2018 at Unknown time  . Nebivolol HCl (BYSTOLIC) 20 MG TABS Take 20 mg by mouth daily.    06/01/2018 at Unknown time  . polyethylene glycol (MIRALAX / GLYCOLAX) packet Take 6 g by mouth daily.    06/01/2018 at Unknown time  . TURMERIC PO Take 1 capsule by mouth daily.   06/01/2018 at Unknown time   Allergies  Allergen Reactions  . Morphine Other (See Comments)    Hypotension    Social History   Tobacco Use  . Smoking  status: Never Smoker  . Smokeless tobacco: Never Used  Substance Use Topics  . Alcohol use: Yes    Alcohol/week: 7.0 - 10.0 standard drinks    Types: 7 - 10 Glasses of wine per week    Family History  Problem Relation Age of Onset  . Heart disease Mother   . Cancer Mother        colon  . Colon cancer Mother   . Hypertension Father   . Heart disease Father   . Lung cancer Father   . Cancer Father        lung  . Stomach cancer Paternal Grandfather   . Esophageal cancer Neg Hx   . Rectal cancer Neg Hx      Review of Systems  Constitutional: Negative.   HENT: Negative.   Eyes: Negative.   Respiratory: Positive for cough.   Cardiovascular: Negative.   Gastrointestinal: Positive for constipation and heartburn.  Genitourinary: Negative.   Musculoskeletal: Positive for joint pain.  Skin: Negative.   Neurological: Negative.   Endo/Heme/Allergies: Negative.   Psychiatric/Behavioral: Negative.     Objective:  Physical Exam  Constitutional: She is oriented to person, place, and time. She appears well-developed.  HENT:  Head: Normocephalic.  Eyes: Pupils are equal, round, and reactive to light.  Neck: Neck supple. No JVD present. No tracheal deviation present. No thyromegaly present.  Cardiovascular: Normal rate, regular rhythm and intact distal pulses.  Respiratory: Effort normal and breath sounds normal. No respiratory distress. She has no wheezes.  GI: Soft. There is no abdominal tenderness. There is no guarding.  Musculoskeletal:     Left knee: She exhibits decreased range of motion, swelling and bony tenderness. She exhibits no ecchymosis, no deformity and no laceration. Tenderness found.  Lymphadenopathy:    She has no cervical adenopathy.  Neurological: She is alert and oriented to person, place, and time. A sensory deficit (neuropathy in the feet) is present.  Skin: Skin is warm and dry.  Psychiatric: She has a normal mood and affect.      Labs:  Estimated  body mass index is 36.61 kg/m as calculated from the following:   Height as of 06/01/18: 5\' 5"  (1.651 m).   Weight as of 06/01/18: 99.8 kg.   Imaging Review Plain radiographs demonstrate severe degenerative joint disease of the left knee.  The bone quality appears to be good for age and reported activity level.    Assessment/Plan:  End stage arthritis, left knee   The patient history, physical examination, clinical judgment of the provider and imaging studies are consistent with end stage degenerative joint disease of the left knee(s) and total knee arthroplasty is deemed medically necessary. The treatment options including medical management, injection therapy arthroscopy and arthroplasty were  discussed at length. The risks and benefits of total knee arthroplasty were presented and reviewed. The risks due to aseptic loosening, infection, stiffness, patella tracking problems, thromboembolic complications and other imponderables were discussed. The patient acknowledged the explanation, agreed to proceed with the plan and consent was signed. Patient is being admitted for inpatient treatment for surgery, pain control, PT, OT, prophylactic antibiotics, VTE prophylaxis, progressive ambulation and ADL's and discharge planning. The patient is planning to be discharged home.     Patient's anticipated LOS is less than 2 midnights, meeting these requirements: - Lives within 1 hour of care - Has a competent adult at home to recover with post-op recover - NO history of  - Chronic pain requiring opiods  - Diabetes  - Coronary Artery Disease  - Heart failure  - Heart attack  - Stroke  - DVT/VTE  - Cardiac arrhythmia  - Respiratory Failure/COPD  - Renal failure  - Anemia  - Advanced Liver disease    [place]ugh. Delmont Prosch   PA-C  10/29/2018, 9:13 AM

## 2018-11-03 DIAGNOSIS — H52203 Unspecified astigmatism, bilateral: Secondary | ICD-10-CM | POA: Diagnosis not present

## 2018-11-03 DIAGNOSIS — H524 Presbyopia: Secondary | ICD-10-CM | POA: Diagnosis not present

## 2018-11-03 DIAGNOSIS — D3131 Benign neoplasm of right choroid: Secondary | ICD-10-CM | POA: Diagnosis not present

## 2018-11-03 DIAGNOSIS — H04123 Dry eye syndrome of bilateral lacrimal glands: Secondary | ICD-10-CM | POA: Diagnosis not present

## 2018-11-03 DIAGNOSIS — H26492 Other secondary cataract, left eye: Secondary | ICD-10-CM | POA: Diagnosis not present

## 2018-11-03 DIAGNOSIS — H5213 Myopia, bilateral: Secondary | ICD-10-CM | POA: Diagnosis not present

## 2018-11-03 DIAGNOSIS — Z961 Presence of intraocular lens: Secondary | ICD-10-CM | POA: Diagnosis not present

## 2018-11-09 ENCOUNTER — Other Ambulatory Visit (HOSPITAL_COMMUNITY): Payer: Medicare Other

## 2018-11-15 DIAGNOSIS — M542 Cervicalgia: Secondary | ICD-10-CM | POA: Diagnosis not present

## 2018-11-16 ENCOUNTER — Encounter (HOSPITAL_COMMUNITY): Admission: RE | Payer: Self-pay | Source: Home / Self Care

## 2018-11-16 ENCOUNTER — Inpatient Hospital Stay (HOSPITAL_COMMUNITY): Admission: RE | Admit: 2018-11-16 | Payer: Medicare Other | Source: Home / Self Care | Admitting: Orthopedic Surgery

## 2018-11-16 SURGERY — ARTHROPLASTY, KNEE, TOTAL
Anesthesia: Spinal | Laterality: Left

## 2018-11-22 DIAGNOSIS — M542 Cervicalgia: Secondary | ICD-10-CM | POA: Diagnosis not present

## 2018-12-01 DIAGNOSIS — E7849 Other hyperlipidemia: Secondary | ICD-10-CM | POA: Diagnosis not present

## 2018-12-01 DIAGNOSIS — I10 Essential (primary) hypertension: Secondary | ICD-10-CM | POA: Diagnosis not present

## 2018-12-08 DIAGNOSIS — R82998 Other abnormal findings in urine: Secondary | ICD-10-CM | POA: Diagnosis not present

## 2018-12-08 DIAGNOSIS — I10 Essential (primary) hypertension: Secondary | ICD-10-CM | POA: Diagnosis not present

## 2018-12-09 DIAGNOSIS — M1990 Unspecified osteoarthritis, unspecified site: Secondary | ICD-10-CM | POA: Diagnosis not present

## 2018-12-09 DIAGNOSIS — E785 Hyperlipidemia, unspecified: Secondary | ICD-10-CM | POA: Diagnosis not present

## 2018-12-09 DIAGNOSIS — R739 Hyperglycemia, unspecified: Secondary | ICD-10-CM | POA: Diagnosis not present

## 2018-12-09 DIAGNOSIS — R002 Palpitations: Secondary | ICD-10-CM | POA: Diagnosis not present

## 2018-12-09 DIAGNOSIS — Z0000 Encounter for general adult medical examination without abnormal findings: Secondary | ICD-10-CM | POA: Diagnosis not present

## 2018-12-09 DIAGNOSIS — K449 Diaphragmatic hernia without obstruction or gangrene: Secondary | ICD-10-CM | POA: Diagnosis not present

## 2018-12-09 DIAGNOSIS — K5900 Constipation, unspecified: Secondary | ICD-10-CM | POA: Diagnosis not present

## 2018-12-09 DIAGNOSIS — H9319 Tinnitus, unspecified ear: Secondary | ICD-10-CM | POA: Diagnosis not present

## 2018-12-09 DIAGNOSIS — I493 Ventricular premature depolarization: Secondary | ICD-10-CM | POA: Diagnosis not present

## 2018-12-09 DIAGNOSIS — I10 Essential (primary) hypertension: Secondary | ICD-10-CM | POA: Diagnosis not present

## 2018-12-09 DIAGNOSIS — M542 Cervicalgia: Secondary | ICD-10-CM | POA: Diagnosis not present

## 2018-12-09 DIAGNOSIS — R05 Cough: Secondary | ICD-10-CM | POA: Diagnosis not present

## 2018-12-23 DIAGNOSIS — M542 Cervicalgia: Secondary | ICD-10-CM | POA: Diagnosis not present

## 2018-12-30 DIAGNOSIS — M542 Cervicalgia: Secondary | ICD-10-CM | POA: Diagnosis not present

## 2019-01-04 DIAGNOSIS — Z1231 Encounter for screening mammogram for malignant neoplasm of breast: Secondary | ICD-10-CM | POA: Diagnosis not present

## 2019-01-04 DIAGNOSIS — Z01419 Encounter for gynecological examination (general) (routine) without abnormal findings: Secondary | ICD-10-CM | POA: Diagnosis not present

## 2019-01-04 DIAGNOSIS — Z6839 Body mass index (BMI) 39.0-39.9, adult: Secondary | ICD-10-CM | POA: Diagnosis not present

## 2019-01-04 DIAGNOSIS — R05 Cough: Secondary | ICD-10-CM | POA: Diagnosis not present

## 2019-01-05 NOTE — Progress Notes (Signed)
Need orders for 6-4 surgery

## 2019-01-06 DIAGNOSIS — M542 Cervicalgia: Secondary | ICD-10-CM | POA: Diagnosis not present

## 2019-01-16 IMAGING — CT CT HEART SCORING
3 series · 14 of 20 positions shown, 16 images · non-contrast
Comparison: None.

CLINICAL DATA: Family history of heart disease.  Hyperlipidemia.

EXAM:
CT HEART FOR CALCIUM SCORING
TECHNIQUE: CT heart was performed on a 64 channel system using prospective ECG
gating.
A non-contrast exam for calcium scoring was performed.
Note that this exam targets the heart and the chest was not imaged
in its entirety.

[Series 2: calcium scoring 2.00 qr36 bestdiast 71% · axial · 0.46mm/px · z∈[+1697,+1781]mm · 4 of 70 slices shown]
[im 14/70  vessel]
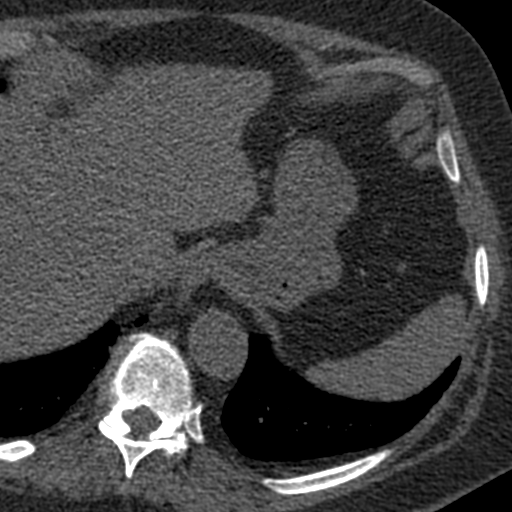
[im 28/70  vessel]
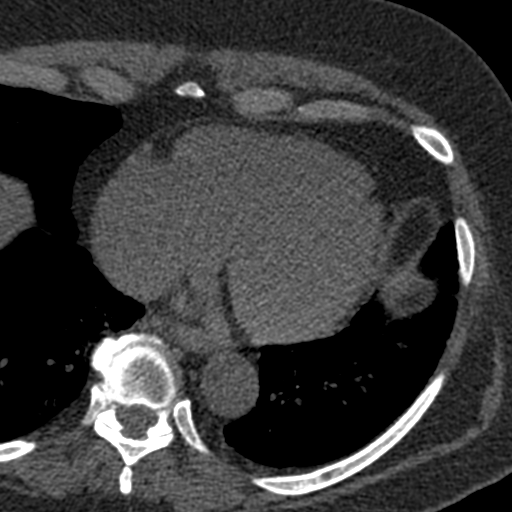
[im 42/70  vessel]
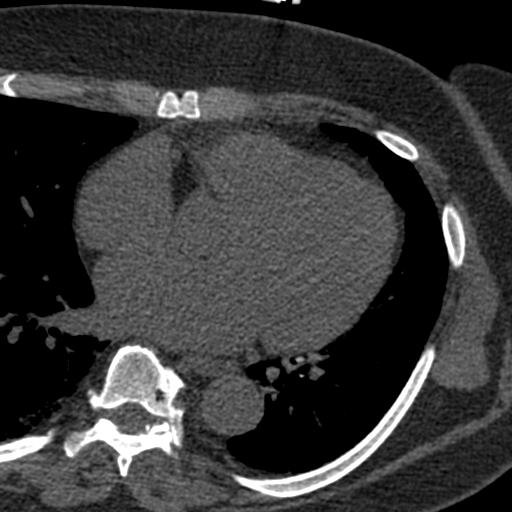
[im 56/70  vessel]
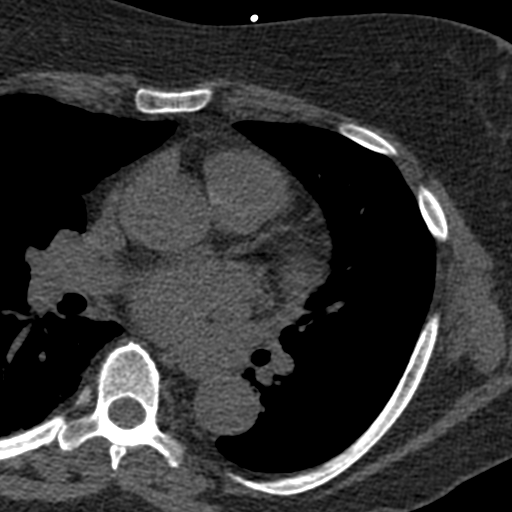

[Series 3: calcium scoring 2.00 br40 bestdiast 71% ax fov · axial · 0.65mm/px · z∈[+1693,+1785]mm · 5 of 70 slices shown, 7 images]
[im 12/70  vessel]
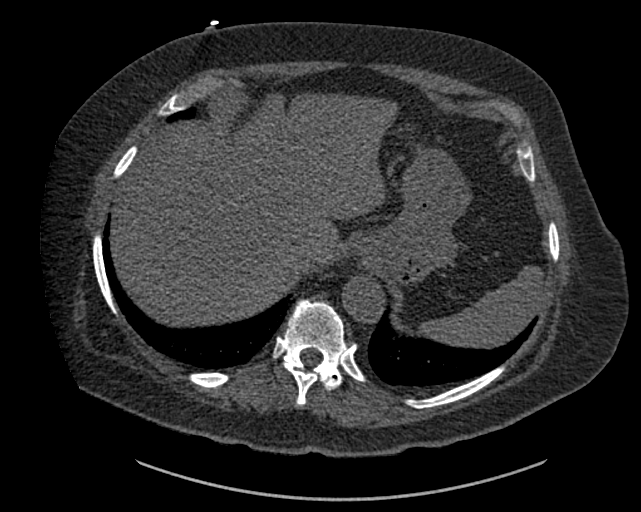
[im 12/70  lung]
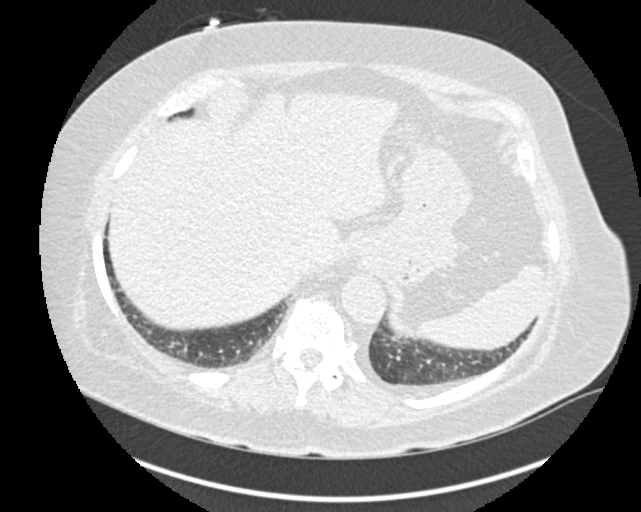
[im 24/70  vessel]
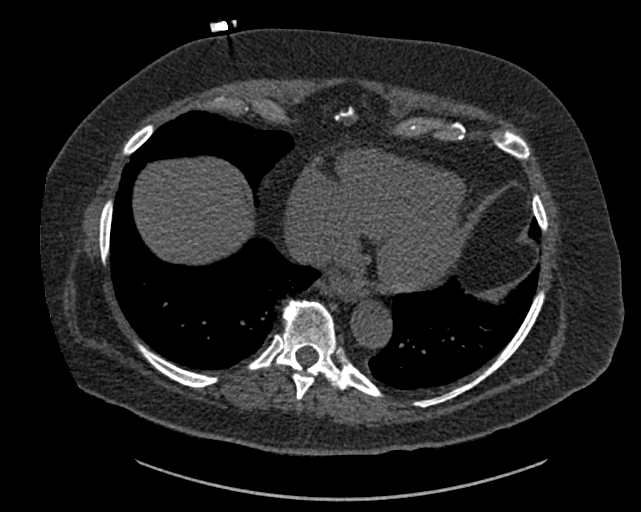
[im 35/70  vessel]
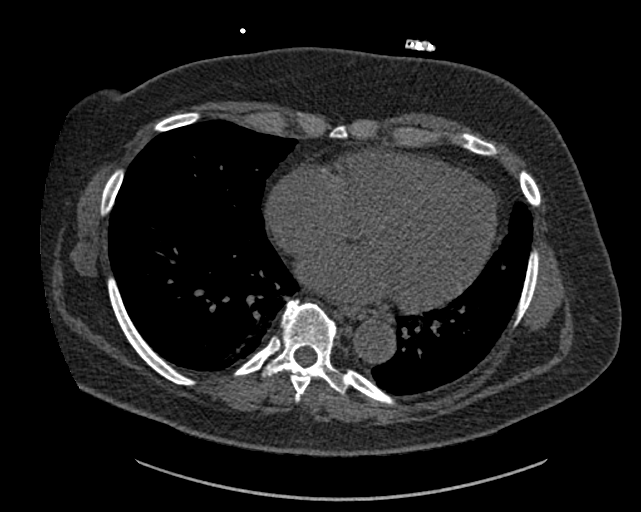
[im 47/70  vessel]
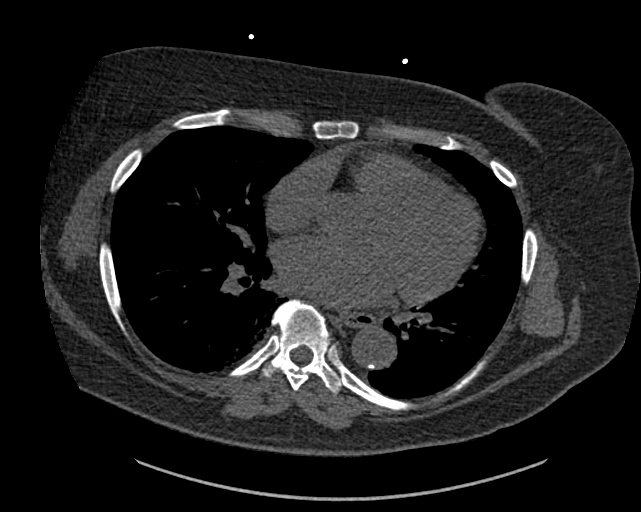
[im 58/70  vessel]
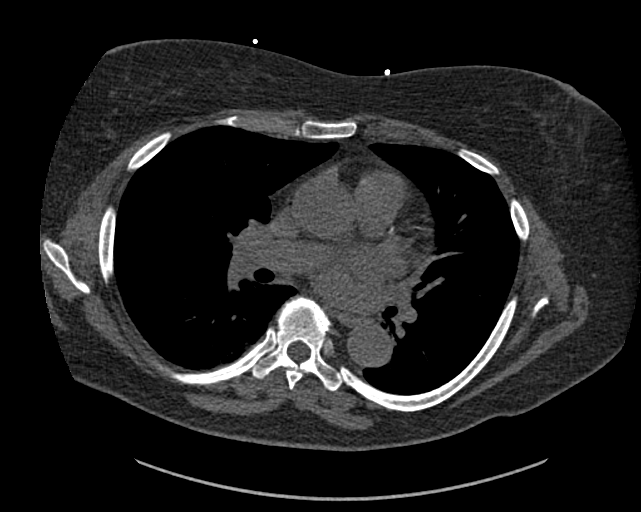
[im 58/70  lung]
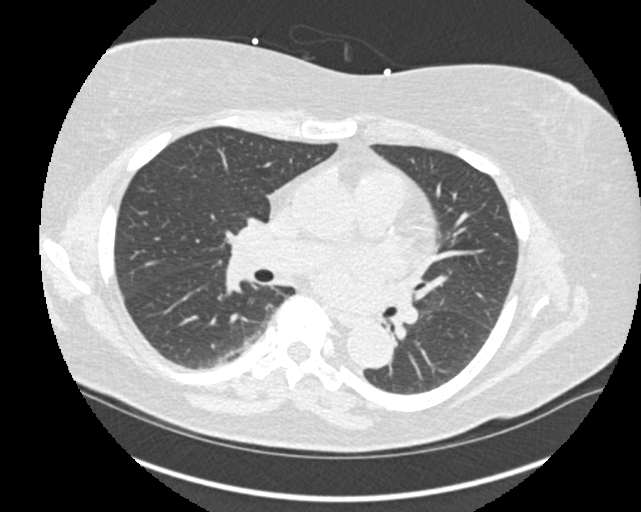

[Series 9: calcium scoring 2.00 br60 bestdiast 71% ax fov · axial · 0.65mm/px · z∈[+1693,+1785]mm · 5 of 70 slices shown]
[im 12/70  vessel]
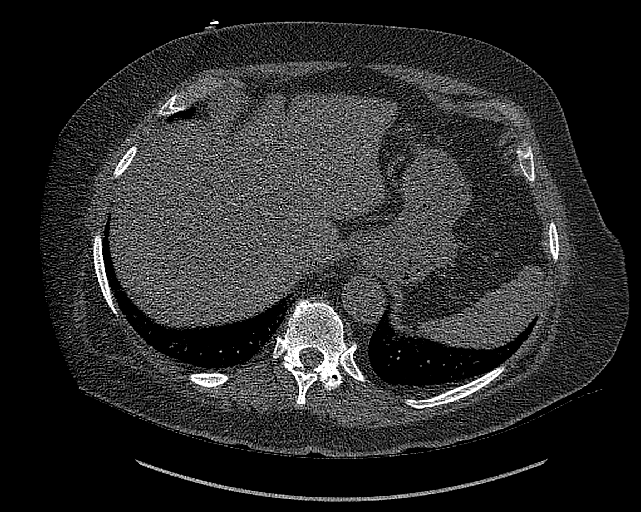
[im 24/70  vessel]
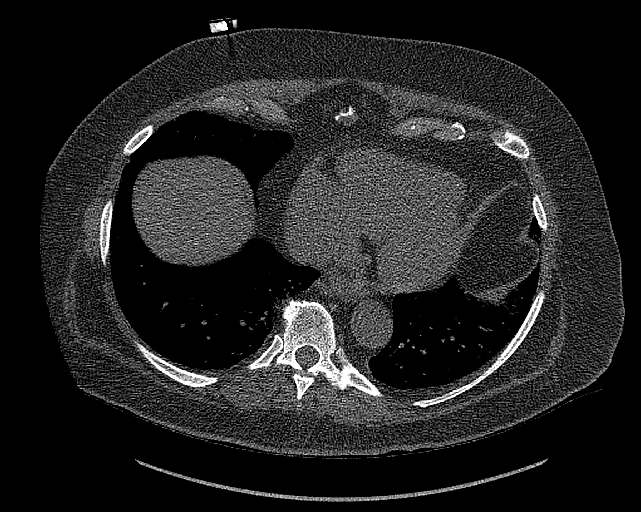
[im 35/70  vessel]
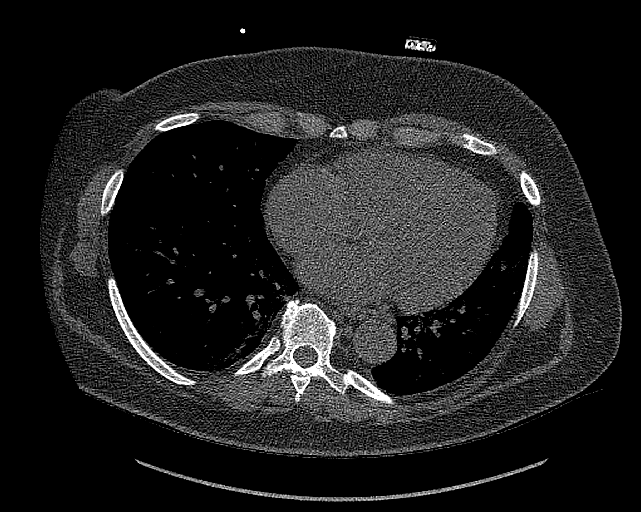
[im 47/70  vessel]
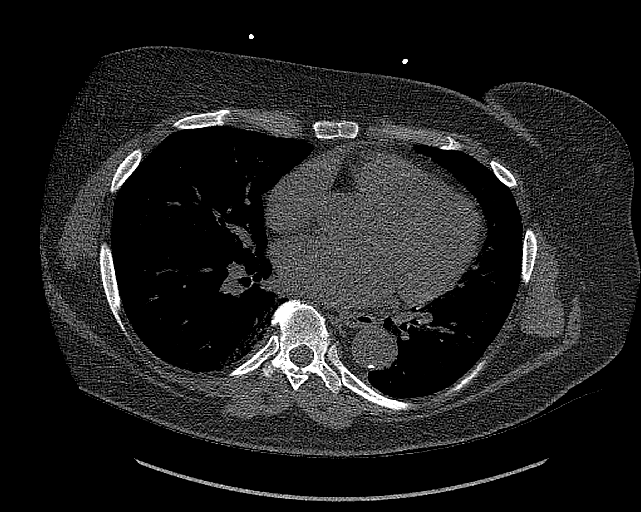
[im 58/70  vessel]
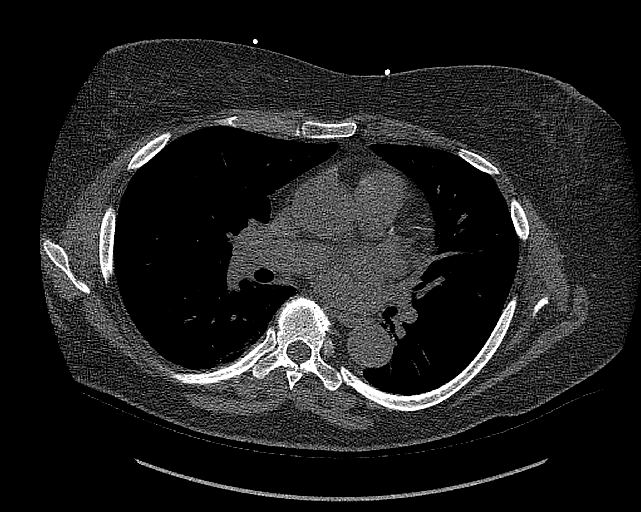

[14 of 20 positions shown; findings below may reference images not displayed]

FINDINGS: Technical quality: Good.

CORONARY CALCIUM

Total Agatston Score: 77 with calcifications most notable in the
left anterior descending coronary artery, also present in the left
circumflex coronary artery.

[HOSPITAL] percentile:  63

OTHER FINDINGS:

Cardiovascular: Heart is borderline in size. Visualized aorta is
normal caliber.

Mediastinum/Nodes: No adenopathy in the lower mediastinum or hila.

Lungs/Pleura: Ground-glass and linear interstitial thickening noted
dependently in the right lower lobe and in the lung bases, favor
scarring. No confluent opacities otherwise. No effusions.

Upper Abdomen: Imaging into the upper abdomen shows no acute
findings.

Musculoskeletal: Chest wall soft tissues are unremarkable. No acute
bony abnormality.
IMPRESSION: The observed calcium score of 77 is at the percentile 63 for
subjects of the same age, gender and race/ethnicity who are free of
clinical cardiovascular disease and treated diabetes.

Areas of probable scarring in the lower lobes bilaterally. No acute
extra cardiac abnormality.

## 2019-01-20 ENCOUNTER — Other Ambulatory Visit: Payer: Self-pay

## 2019-01-20 ENCOUNTER — Encounter (HOSPITAL_COMMUNITY)
Admission: RE | Admit: 2019-01-20 | Discharge: 2019-01-20 | Disposition: A | Discharge status: Home / Self Care | Payer: Medicare Other | Source: Ambulatory Visit | Attending: Orthopedic Surgery | Admitting: Orthopedic Surgery

## 2019-01-20 ENCOUNTER — Encounter (HOSPITAL_COMMUNITY): Payer: Self-pay

## 2019-01-20 DIAGNOSIS — Z01812 Encounter for preprocedural laboratory examination: Secondary | ICD-10-CM | POA: Diagnosis not present

## 2019-01-20 DIAGNOSIS — M1712 Unilateral primary osteoarthritis, left knee: Secondary | ICD-10-CM | POA: Diagnosis not present

## 2019-01-20 HISTORY — DX: Dyspnea, unspecified: R06.00

## 2019-01-20 HISTORY — DX: Hyperlipidemia, unspecified: E78.5

## 2019-01-20 LAB — CBC
HCT: 39.9 % (ref 36.0–46.0)
Hemoglobin: 12.6 g/dL (ref 12.0–15.0)
MCH: 29.6 pg (ref 26.0–34.0)
MCHC: 31.6 g/dL (ref 30.0–36.0)
MCV: 93.7 fL (ref 80.0–100.0)
Platelets: 239 10*3/uL (ref 150–400)
RBC: 4.26 MIL/uL (ref 3.87–5.11)
RDW: 13.4 % (ref 11.5–15.5)
WBC: 5.6 10*3/uL (ref 4.0–10.5)
nRBC: 0 % (ref 0.0–0.2)

## 2019-01-20 LAB — ABO/RH: ABO/RH(D): A POS

## 2019-01-20 LAB — SURGICAL PCR SCREEN
MRSA, PCR: NEGATIVE
Staphylococcus aureus: NEGATIVE

## 2019-01-20 LAB — BASIC METABOLIC PANEL
Anion gap: 8 (ref 5–15)
BUN: 25 mg/dL — ABNORMAL HIGH (ref 8–23)
CO2: 28 mmol/L (ref 22–32)
Calcium: 8.9 mg/dL (ref 8.9–10.3)
Chloride: 104 mmol/L (ref 98–111)
Creatinine, Ser: 1.01 mg/dL — ABNORMAL HIGH (ref 0.44–1.00)
GFR calc Af Amer: >60 mL/min (ref >60)
GFR calc non Af Amer: 55 mL/min — ABNORMAL LOW (ref >60)
Glucose, Bld: 110 mg/dL — ABNORMAL HIGH (ref 70–99)
Potassium: 3.8 mmol/L (ref 3.5–5.1)
Sodium: 140 mmol/L (ref 135–145)

## 2019-01-20 NOTE — Progress Notes (Addendum)
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    Pt reports no acute cardiac sx at pre-op

## 2019-01-20 NOTE — Patient Instructions (Addendum)
YOU ARE REQUIRED TO BE TESTED FOR COVID-19 PRIOR TO YOUR SURGERY . YOUR TEST MUST BE COMPLETED ON Monday June 1st, 2020. TESTING IS LOCATED AT [facility] FROM 9:00AM - 3:00PM. FAILURE TO COMPLETE TESTING MAY RESULT IN CANCELLATION OF YOUR SURGERY.                   Chloe Holmes     Your procedure is scheduled on: 01-27-2019   Report to [facility] Main  Entrance     Report to admitting at 5:30AM      Call this number if you have problems the morning of surgery [phone]      Remember: Do not eat food After Midnight. YOU MAY HAVE CLEAR LIQUIDS FROM MIDNIGHT UNTIL 4:30AM. At 4:30AM Please finish the prescribed Pre-Surgery ENSURE drink. Nothing by mouth after you finish the ENSURE drink !      CLEAR LIQUID DIET   Foods Allowed                                                                     Foods Excluded  Coffee and tea, regular and decaf                             liquids that you cannot  Plain Jell-O in any flavor                                             see through such as: Fruit ices (not with fruit pulp)                                     milk, soups, orange juice  Iced Popsicles                                    All solid food Carbonated beverages, regular and diet                                    Cranberry, grape and apple juices Sports drinks like Gatorade Lightly seasoned clear broth or consume(fat free) Sugar, honey syrup  Sample Menu Breakfast                                Lunch                                     Supper Cranberry juice                    Beef broth  Chicken broth Jell-O                                     Grape juice                           Apple juice Coffee or tea                        Jell-O                                      Popsicle                                                Coffee or tea                        Coffee or  tea  _____________________________________________________________________    BRUSH YOUR TEETH MORNING OF SURGERY AND RINSE YOUR MOUTH OUT, NO CHEWING GUM CANDY OR MINTS.     Take these medicines the morning of surgery with A SIP OF WATER: Bystolic                                 You may not have any metal on your body including hair pins and              piercings  Do not wear jewelry, make-up, lotions, powders or perfumes, deodorant             Do not wear nail polish.  Do not shave  48 hours prior to surgery.             Do not bring valuables to the hospital. [place].  Contacts, dentures or bridgework may not be worn into surgery.               Please read over the following fact sheets you were given: _____________________________________________________________________  [facility] - Preparing for Surgery Before surgery, you can play an important role.  Because skin is not sterile, your skin needs to be as free of germs as possible.  You can reduce the number of germs on your skin by washing with CHG (chlorahexidine gluconate) soap before surgery.  CHG is an antiseptic cleaner which kills germs and bonds with the skin to continue killing germs even after washing. Please DO NOT use if you have an allergy to CHG or antibacterial soaps.  If your skin becomes reddened/irritated stop using the CHG and inform your nurse when you arrive at Short Stay. Do not shave (including legs and underarms) for at least 48 hours prior to the first CHG shower.  You may shave your face/neck. Please follow these instructions carefully:  1.  Shower with CHG Soap the night before surgery and the  morning of Surgery.  2.  If you choose to wash your hair, wash your hair first as usual with your  normal  shampoo.  3.  After you shampoo, rinse your hair and body thoroughly to remove the  shampoo.                           4.  Use CHG as you would any other  liquid soap.  You can apply chg directly  to the skin and wash                       Gently with a scrungie or clean washcloth.  5.  Apply the CHG Soap to your body ONLY FROM THE NECK DOWN.   Do not use on face/ open                           Wound or open sores. Avoid contact with eyes, ears mouth and genitals (private parts).                       Wash face,  Genitals (private parts) with your normal soap.             6.  Wash thoroughly, paying special attention to the area where your surgery  will be performed.  7.  Thoroughly rinse your body with warm water from the neck down.  8.  DO NOT shower/wash with your normal soap after using and rinsing off  the CHG Soap.                9.  Pat yourself dry with a clean towel.            10.  Wear clean pajamas.            11.  Place clean sheets on your bed the night of your first shower and do not  sleep with pets. Day of Surgery : Do not apply any lotions/deodorants the morning of surgery.  Please wear clean clothes to the hospital/surgery center.  FAILURE TO FOLLOW THESE INSTRUCTIONS MAY RESULT IN THE CANCELLATION OF YOUR SURGERY PATIENT SIGNATURE_________________________________  NURSE SIGNATURE__________________________________  ________________________________________________________________________   Chloe Holmes  An incentive spirometer is a tool that can help keep your lungs clear and active. This tool measures how well you are filling your lungs with each breath. Taking long deep breaths may help reverse or decrease the chance of developing breathing (pulmonary) problems (especially infection) following:  A long period of time when you are unable to move or be active. BEFORE THE PROCEDURE   If the spirometer includes an indicator to show your best effort, your nurse or respiratory therapist will set it to a desired goal.  If possible, sit up straight or lean slightly forward. Try not to slouch.  Hold the incentive  spirometer in an upright position. INSTRUCTIONS FOR USE  1. Sit on the edge of your bed if possible, or sit up as far as you can in bed or on a chair. 2. Hold the incentive spirometer in an upright position. 3. Breathe out normally. 4. Place the mouthpiece in your mouth and seal your lips tightly around it. 5. Breathe in slowly and as deeply as possible, raising the piston or the ball toward the top of the column. 6. Hold your breath for 3-5 seconds or for as long as possible. Allow the piston or ball to fall to the bottom of the column. 7. Remove the mouthpiece from your mouth and breathe out normally. 8. Rest for  a few seconds and repeat Steps 1 through 7 at least 10 times every 1-2 hours when you are awake. Take your time and take a few normal breaths between deep breaths. 9. The spirometer may include an indicator to show your best effort. Use the indicator as a goal to work toward during each repetition. 10. After each set of 10 deep breaths, practice coughing to be sure your lungs are clear. If you have an incision (the cut made at the time of surgery), support your incision when coughing by placing a pillow or rolled up towels firmly against it. Once you are able to get out of bed, walk around indoors and cough well. You may stop using the incentive spirometer when instructed by your caregiver.  RISKS AND COMPLICATIONS  Take your time so you do not get dizzy or light-headed.  If you are in pain, you may need to take or ask for pain medication before doing incentive spirometry. It is harder to take a deep breath if you are having pain. AFTER USE  Rest and breathe slowly and easily.  It can be helpful to keep track of a log of your progress. Your caregiver can provide you with a simple table to help with this. If you are using the spirometer at home, follow these instructions: [place] IF:   You are having difficultly using the spirometer.  You have trouble using the  spirometer as often as instructed.  Your pain medication is not giving enough relief while using the spirometer.  You develop fever of 100.5 F (38.1 C) or higher. SEEK IMMEDIATE MEDICAL CARE IF:   You cough up bloody sputum that had not been present before.  You develop fever of 102 F (38.9 C) or greater.  You develop worsening pain at or near the incision site. MAKE SURE YOU:   Understand these instructions.  Will watch your condition.  Will get help right away if you are not doing well or get worse. Document Released: 12/22/2006 Document Revised: 11/03/2011 Document Reviewed: 02/22/2007 Exit[employer] Patient Information 2014 [employer], [place].   ________________________________________________________________________  WHAT IS A BLOOD TRANSFUSION? Blood Transfusion Information  A transfusion is the replacement of blood or some of its parts. Blood is made up of multiple cells which provide different functions.  Red blood cells carry oxygen and are used for blood loss replacement.  White blood cells fight against infection.  Platelets control bleeding.  Plasma helps clot blood.  Other blood products are available for specialized needs, such as hemophilia or other clotting disorders. BEFORE THE TRANSFUSION  Who gives blood for transfusions?   Healthy volunteers who are fully evaluated to make sure their blood is safe. This is blood bank blood. Transfusion therapy is the safest it has ever been in the practice of medicine. Before blood is taken from a donor, a complete history is taken to make sure that person has no history of diseases nor engages in risky social behavior (examples are intravenous drug use or sexual activity with multiple partners). The donor's travel history is screened to minimize risk of transmitting infections, such as malaria. The donated blood is tested for signs of infectious diseases, such as HIV and hepatitis. The blood is then tested to be sure it is  compatible with you in order to minimize the chance of a transfusion reaction. If you or a relative donates blood, this is often done in anticipation of surgery and is not appropriate for emergency situations. It takes many days  to process the donated blood. RISKS AND COMPLICATIONS Although transfusion therapy is very safe and saves many lives, the main dangers of transfusion include:   Getting an infectious disease.  Developing a transfusion reaction. This is an allergic reaction to something in the blood you were given. Every precaution is taken to prevent this. The decision to have a blood transfusion has been considered carefully by your caregiver before blood is given. Blood is not given unless the benefits outweigh the risks. AFTER THE TRANSFUSION  Right after receiving a blood transfusion, you will usually feel much better and more energetic. This is especially true if your red blood cells have gotten low (anemic). The transfusion raises the level of the red blood cells which carry oxygen, and this usually causes an energy increase.  The nurse administering the transfusion will monitor you carefully for complications. HOME CARE INSTRUCTIONS  No special instructions are needed after a transfusion. You may find your energy is better. Speak with your caregiver about any limitations on activity for underlying diseases you may have. SEEK MEDICAL CARE IF:   Your condition is not improving after your transfusion.  You develop redness or irritation at the intravenous (IV) site. SEEK IMMEDIATE MEDICAL CARE IF:  Any of the following symptoms occur over the next 12 hours:  Shaking chills.  You have a temperature by mouth above 102 F (38.9 C), not controlled by medicine.  Chest, back, or muscle pain.  People around you feel you are not acting correctly or are confused.  Shortness of breath or difficulty breathing.  Dizziness and fainting.  You get a rash or develop hives.  You have  a decrease in urine output.  Your urine turns a dark color or changes to pink, red, or brown. Any of the following symptoms occur over the next 10 days:  You have a temperature by mouth above 102 F (38.9 C), not controlled by medicine.  Shortness of breath.  Weakness after normal activity.  The white part of the eye turns yellow (jaundice).  You have a decrease in the amount of urine or are urinating less often.  Your urine turns a dark color or changes to pink, red, or brown. Document Released: 08/08/2000 Document Revised: 11/03/2011 Document Reviewed: 03/27/2008 [facility] Patient Information 2014 [place], [place].  _______________________________________________________________________

## 2019-01-24 ENCOUNTER — Other Ambulatory Visit (HOSPITAL_COMMUNITY)
Admission: RE | Admit: 2019-01-24 | Discharge: 2019-01-24 | Disposition: A | Discharge status: Home / Self Care | Payer: Medicare Other | Source: Ambulatory Visit | Attending: Orthopedic Surgery | Admitting: Orthopedic Surgery

## 2019-01-24 DIAGNOSIS — Z1159 Encounter for screening for other viral diseases: Secondary | ICD-10-CM | POA: Diagnosis not present

## 2019-01-26 LAB — NOVEL CORONAVIRUS, NAA (HOSP ORDER, SEND-OUT TO REF LAB; TAT 18-24 HRS): SARS-CoV-2, NAA: NOT DETECTED

## 2019-01-26 NOTE — Anesthesia Preprocedure Evaluation (Addendum)
Anesthesia Evaluation  Patient identified by MRN, date of birth, ID band Patient awake    Reviewed: Allergy & Precautions, NPO status , Patient's Chart, lab work & pertinent test results  Airway Mallampati: II  TM Distance: >3 FB Neck ROM: Full    Dental no notable dental hx.    Pulmonary neg pulmonary ROS,    Pulmonary exam normal        Cardiovascular hypertension, Pt. on medications Normal cardiovascular exam     Neuro/Psych Anxiety negative neurological ROS     GI/Hepatic Neg liver ROS, hiatal hernia, GERD  Medicated and Controlled,  Endo/Other  negative endocrine ROS  Renal/GU negative Renal ROS     Musculoskeletal  (+) Arthritis , Osteoarthritis,    Abdominal (+) + obese,   Peds  Hematology HLD   Anesthesia Other Findings Left knee osteoarthritis  Reproductive/Obstetrics                            Anesthesia Physical Anesthesia Plan  ASA: II  Anesthesia Plan: Spinal and Regional   Post-op Pain Management:  Regional for Post-op pain   Induction:   PONV Risk Score and Plan: 2 and Propofol infusion, Treatment may vary due to age or medical condition, Ondansetron and Dexamethasone  Airway Management Planned: Simple Face Mask  Additional Equipment:   Intra-op Plan:   Post-operative Plan:   Informed Consent: I have reviewed the patients History and Physical, chart, labs and discussed the procedure including the risks, benefits and alternatives for the proposed anesthesia with the patient or authorized representative who has indicated his/her understanding and acceptance.     Dental advisory given  Plan Discussed with: CRNA  Anesthesia Plan Comments:        Anesthesia Quick Evaluation

## 2019-01-26 NOTE — H&P (Signed)
TOTAL KNEE ADMISSION H&P  Patient is being admitted for left total knee arthroplasty.  Subjective:  Chief Complaint:  Left knee primary OA / pain  HPI: Chloe Holmes, 73 y.o. female, has a history of pain and functional disability in the left knee due to arthritis and has failed non-surgical conservative treatments for greater than 12 weeks to includeNSAID's and/or analgesics, corticosteriod injections, viscosupplementation injections and activity modification.  Onset of symptoms was gradual, starting 5 years ago with gradually worsening course since that time. The patient noted prior procedures on the knee to include  arthroscopy on the left knee(s).  Patient currently rates pain in the left knee(s) at 10 out of 10 with activity. Patient has worsening of pain with activity and weight bearing, pain that interferes with activities of daily living, pain with passive range of motion, crepitus and joint swelling.  Patient has evidence of periarticular osteophytes and joint space narrowing by imaging studies. There is no active infection.  Risks, benefits and expectations were discussed with the patient.  Risks including but not limited to the risk of anesthesia, blood clots, nerve damage, blood vessel damage, failure of the prosthesis, infection and up to and including death.  Patient understand the risks, benefits and expectations and wishes to proceed with surgery.   PCP: Shon Baton, MD  D/C Plans:       Home  Post-op Meds:       No Rx given   Tranexamic Acid:      To be given - IV   Decadron:      Is to be given  FYI:      ASA  Norco  DME:   Pt equipment arranged  PT:   OPPT Rx  Pharmacy:  [facility]  Patient Active Problem List   Diagnosis Date Noted  . Chest pain 06/01/2018  . Obesity (BMI 30-39.9) 06/01/2018  . 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 Fibroid uterus 11/24/2013  . Arthritis   . Hypertension   . Essential hypertension 03/30/2009  . Esophageal reflux 03/30/2009  .  DIVERTICULOSIS-COLON 03/30/2009  . DYSPHAGIA 03/30/2009  . ESOPHAGEAL STRICTURE 03/29/2009  . HIATAL HERNIA WITH REFLUX 03/29/2009  . DYSPHAGIA UNSPECIFIED 03/29/2009   Past Medical History:  Diagnosis Date  . Arthritis   . Diverticulosis   . Dyspnea    At pre-op , pt  (a retired Warden/ranger) reports past Hx of SOB that she found out was being caused by frequent PVCs, she believes the PVCs were related to her poor tolerance of daily statin medication regimen ; however reports resolution of SOB now that she is only taking statin three times a week and taking bystolic to manage dysrhythmia.  [name] GERD (gastroesophageal reflux disease)   . History of hiatal hernia   . HLD (hyperlipidemia)   . Hypertension     Past Surgical History:  Procedure Laterality Date  . ABDOMINAL HYSTERECTOMY    . CERVICAL DISC SURGERY    . COLONOSCOPY  2016  . DILATION AND CURETTAGE OF UTERUS    . ESOPHAGEAL MANOMETRY N/A 10/03/2013   Procedure: ESOPHAGEAL MANOMETRY (EM);  Surgeon: Jerene Bears, MD;  Location: [facility];  Service: Gastroenterology;  Laterality: N/A;  . EYE SURGERY     bilateral cataracts   . ORBITAL FRACTURE SURGERY      No current facility-administered medications for this encounter.    Current Outpatient Medications  Medication Sig Dispense Refill Last Dose  . ALPRAZolam (XANAX) 0.25 MG tablet Take 0.25  mg by mouth at bedtime as needed for anxiety.     [name] atorvastatin (LIPITOR) 10 MG tablet Take 10 mg by mouth every Monday, Wednesday, and Friday at 8 PM. AT NIGHT     . diclofenac (VOLTAREN) 50 MG EC tablet Take 50 mg by mouth 2 (two) times daily as needed for moderate pain.    06/01/2018 at Unknown time  . diclofenac sodium (VOLTAREN) 1 % GEL Apply 2 g topically 3 (three) times daily as needed (pain).      . hydrochlorothiazide (HYDRODIURIL) 25 MG tablet Take 25 mg by mouth daily.   06/01/2018 at Unknown time  . Nebivolol HCl (BYSTOLIC) 20 MG TABS Take 20 mg by mouth daily.    06/01/2018 at  Unknown time  . omeprazole (PRILOSEC OTC) 20 MG tablet Take 20 mg by mouth at bedtime.     . polyethylene glycol (MIRALAX / GLYCOLAX) packet Take 6 g by mouth daily.    06/01/2018 at Unknown time  . TURMERIC PO Take 1 capsule by mouth daily with lunch.    06/01/2018 at Unknown time   Allergies  Allergen Reactions  . Morphine Other (See Comments)    Hypotension; had to be narcan'd . This was 40 years ago     Social History   Tobacco Use  . Smoking status: Never Smoker  . Smokeless tobacco: Never Used  Substance Use Topics  . Alcohol use: Yes    Alcohol/week: 7.0 - 10.0 standard drinks    Types: 7 - 10 Glasses of wine per week    Comment: 3 glassess a day     Family History  Problem Relation Age of Onset  . Heart disease Mother   . Cancer Mother        colon  . Colon cancer Mother   . Hypertension Father   . Heart disease Father   . Lung cancer Father   . Cancer Father        lung  . Stomach cancer Paternal Grandfather   . Esophageal cancer Neg Hx   . Rectal cancer Neg Hx      Review of Systems  Constitutional: Negative.   HENT: Negative.   Eyes: Negative.   Respiratory: Negative.   Cardiovascular: Negative.   Gastrointestinal: Positive for heartburn.  Genitourinary: Negative.   Musculoskeletal: Positive for joint pain.  Skin: Negative.   Neurological: Negative.   Endo/Heme/Allergies: Negative.   Psychiatric/Behavioral: Negative.     Objective:  Physical Exam  Constitutional: She is oriented to person, place, and time. She appears well-developed.  HENT:  Head: Normocephalic.  Eyes: Pupils are equal, round, and reactive to light.  Neck: Neck supple. No JVD present. No tracheal deviation present. No thyromegaly present.  Cardiovascular: Normal rate, regular rhythm and intact distal pulses.  Respiratory: Effort normal and breath sounds normal. No respiratory distress. She has no wheezes.  GI: Soft. There is no abdominal tenderness. There is no guarding.   Musculoskeletal:     Left knee: She exhibits decreased range of motion, swelling and bony tenderness. She exhibits no ecchymosis, no deformity, no laceration and no erythema. Tenderness found.  Lymphadenopathy:    She has no cervical adenopathy.  Neurological: She is alert and oriented to person, place, and time.  Skin: Skin is warm and dry.  Psychiatric: She has a normal mood and affect.      Labs:  Estimated body mass index is 39.01 kg/m as calculated from the following:   Height as of 01/20/19: 5'  4" (1.626 m).   Weight as of 01/20/19: 103.1 kg.   Imaging Review Plain radiographs demonstrate severe degenerative joint disease of the left knee(s).  The bone quality appears to be good for age and reported activity level.      Assessment/Plan:  End stage arthritis, left knee   The patient history, physical examination, clinical judgment of the provider and imaging studies are consistent with end stage degenerative joint disease of the left knee(s) and total knee arthroplasty is deemed medically necessary. The treatment options including medical management, injection therapy arthroscopy and arthroplasty were discussed at length. The risks and benefits of total knee arthroplasty were presented and reviewed. The risks due to aseptic loosening, infection, stiffness, patella tracking problems, thromboembolic complications and other imponderables were discussed. The patient acknowledged the explanation, agreed to proceed with the plan and consent was signed. Patient is being admitted for inpatient treatment for surgery, pain control, PT, OT, prophylactic antibiotics, VTE prophylaxis, progressive ambulation and ADL's and discharge planning. The patient is planning to be discharged home.     Patient's anticipated LOS is less than 2 midnights, meeting these requirements: - Lives within 1 hour of care - Has a competent adult at home to recover with post-op recover - NO history of  -  Chronic pain requiring opiods  - Diabetes  - Coronary Artery Disease  - Heart failure  - Heart attack  - Stroke  - DVT/VTE  - Cardiac arrhythmia  - Respiratory Failure/COPD  - Renal failure  - Anemia  - Advanced Liver disease    West Pugh. Myrle Dues   PA-C  01/26/2019, 1:37 PM

## 2019-01-26 NOTE — Progress Notes (Signed)
SPOKE W/  Patient via phone     SCREENING SYMPTOMS OF COVID 19:   COUGH-- yes, acid reflux. Pt has had this for years.   RUNNY NOSE--- no  SORE THROAT--- no  NASAL CONGESTION---- no  SNEEZING----no   SHORTNESS OF BREATH--- no  DIFFICULTY BREATHING--- no  TEMP >100.0 ----- no  UNEXPLAINED BODY ACHES------ no  CHILLS -------- no  HEADACHES --------- no  LOSS OF SMELL/ TASTE -------- no    HAVE YOU OR ANY FAMILY MEMBER TRAVELLED PAST 14 DAYS OUT OF THE   COUNTY---no STATE----no COUNTRY----no  HAVE YOU OR ANY FAMILY MEMBER BEEN EXPOSED TO ANYONE WITH COVID 19?  no

## 2019-01-27 ENCOUNTER — Other Ambulatory Visit: Payer: Self-pay

## 2019-01-27 ENCOUNTER — Inpatient Hospital Stay (HOSPITAL_COMMUNITY): Payer: Medicare Other | Admitting: Anesthesiology

## 2019-01-27 ENCOUNTER — Observation Stay (HOSPITAL_COMMUNITY)
Admission: RE | Admit: 2019-01-27 | Discharge: 2019-01-28 | Disposition: A | Discharge status: Home / Self Care | Payer: Medicare Other | Attending: Orthopedic Surgery | Admitting: Orthopedic Surgery

## 2019-01-27 ENCOUNTER — Encounter (HOSPITAL_COMMUNITY): Payer: Self-pay | Admitting: Emergency Medicine

## 2019-01-27 ENCOUNTER — Inpatient Hospital Stay (HOSPITAL_COMMUNITY): Payer: Medicare Other | Admitting: Physician Assistant

## 2019-01-27 ENCOUNTER — Encounter (HOSPITAL_COMMUNITY)
Admission: RE | Disposition: A | Discharge status: Home / Self Care | Payer: Self-pay | Source: Home / Self Care | Attending: Orthopedic Surgery

## 2019-01-27 DIAGNOSIS — K449 Diaphragmatic hernia without obstruction or gangrene: Secondary | ICD-10-CM | POA: Diagnosis not present

## 2019-01-27 DIAGNOSIS — E785 Hyperlipidemia, unspecified: Secondary | ICD-10-CM | POA: Diagnosis not present

## 2019-01-27 DIAGNOSIS — Z6839 Body mass index (BMI) 39.0-39.9, adult: Secondary | ICD-10-CM | POA: Insufficient documentation

## 2019-01-27 DIAGNOSIS — D259 Leiomyoma of uterus, unspecified: Secondary | ICD-10-CM | POA: Diagnosis not present

## 2019-01-27 DIAGNOSIS — K219 Gastro-esophageal reflux disease without esophagitis: Secondary | ICD-10-CM | POA: Insufficient documentation

## 2019-01-27 DIAGNOSIS — M1712 Unilateral primary osteoarthritis, left knee: Secondary | ICD-10-CM | POA: Diagnosis not present

## 2019-01-27 DIAGNOSIS — E669 Obesity, unspecified: Secondary | ICD-10-CM | POA: Diagnosis not present

## 2019-01-27 DIAGNOSIS — Z79899 Other long term (current) drug therapy: Secondary | ICD-10-CM | POA: Insufficient documentation

## 2019-01-27 DIAGNOSIS — G8918 Other acute postprocedural pain: Secondary | ICD-10-CM | POA: Diagnosis not present

## 2019-01-27 DIAGNOSIS — Z96652 Presence of left artificial knee joint: Secondary | ICD-10-CM

## 2019-01-27 DIAGNOSIS — I10 Essential (primary) hypertension: Secondary | ICD-10-CM | POA: Diagnosis not present

## 2019-01-27 HISTORY — PX: TOTAL KNEE ARTHROPLASTY: SHX125

## 2019-01-27 LAB — TYPE AND SCREEN
ABO/RH(D): A POS
Antibody Screen: NEGATIVE

## 2019-01-27 SURGERY — ARTHROPLASTY, KNEE, TOTAL
Anesthesia: Regional | Site: Knee | Laterality: Left

## 2019-01-27 MED ORDER — HYDROCHLOROTHIAZIDE 25 MG PO TABS
25.0000 mg | ORAL_TABLET | Freq: Every day | ORAL | Status: DC
  Administered 2019-01-28: 25 mg via ORAL
  Filled 2019-01-27: qty 1

## 2019-01-27 MED ORDER — KETAMINE HCL 10 MG/ML IJ SOLN
INTRAMUSCULAR | Status: DC | PRN
  Administered 2019-01-27 (×3): 10 mg via INTRAVENOUS

## 2019-01-27 MED ORDER — SODIUM CHLORIDE 0.9 % IR SOLN
Status: DC | PRN
  Administered 2019-01-27: 1000 mL

## 2019-01-27 MED ORDER — PHENYLEPHRINE HCL (PRESSORS) 10 MG/ML IV SOLN
INTRAVENOUS | Status: AC
  Filled 2019-01-27: qty 1

## 2019-01-27 MED ORDER — ACETAMINOPHEN 325 MG PO TABS
325.0000 mg | ORAL_TABLET | Freq: Four times a day (QID) | ORAL | Status: DC | PRN
  Administered 2019-01-28: 325 mg via ORAL
  Filled 2019-01-27: qty 1

## 2019-01-27 MED ORDER — ALPRAZOLAM 0.25 MG PO TABS
0.2500 mg | ORAL_TABLET | Freq: Every evening | ORAL | Status: DC | PRN
  Administered 2019-01-27: 0.25 mg via ORAL
  Filled 2019-01-27: qty 1

## 2019-01-27 MED ORDER — CEFAZOLIN SODIUM-DEXTROSE 2-4 GM/100ML-% IV SOLN
2.0000 g | INTRAVENOUS | Status: AC
  Administered 2019-01-27: 07:00:00 2 g via INTRAVENOUS
  Filled 2019-01-27: qty 100

## 2019-01-27 MED ORDER — PROPOFOL 10 MG/ML IV BOLUS
INTRAVENOUS | Status: DC | PRN
  Administered 2019-01-27 (×4): 10 mg via INTRAVENOUS

## 2019-01-27 MED ORDER — HYDROCODONE-ACETAMINOPHEN 5-325 MG PO TABS
1.0000 | ORAL_TABLET | ORAL | Status: DC | PRN
  Administered 2019-01-27 – 2019-01-28 (×3): 1 via ORAL
  Filled 2019-01-27 (×3): qty 1

## 2019-01-27 MED ORDER — CEFAZOLIN SODIUM-DEXTROSE 2-4 GM/100ML-% IV SOLN
2.0000 g | Freq: Four times a day (QID) | INTRAVENOUS | Status: AC
  Administered 2019-01-27 (×2): 2 g via INTRAVENOUS
  Filled 2019-01-27 (×2): qty 100

## 2019-01-27 MED ORDER — HYDROCODONE-ACETAMINOPHEN 7.5-325 MG PO TABS: 1.0000 | ORAL_TABLET | ORAL | Status: DC | PRN

## 2019-01-27 MED ORDER — METOCLOPRAMIDE HCL 5 MG/ML IJ SOLN: 5.0000 mg | Freq: Three times a day (TID) | INTRAMUSCULAR | Status: DC | PRN

## 2019-01-27 MED ORDER — CELECOXIB 200 MG PO CAPS
200.0000 mg | ORAL_CAPSULE | Freq: Two times a day (BID) | ORAL | Status: DC
  Administered 2019-01-27 – 2019-01-28 (×2): 200 mg via ORAL
  Filled 2019-01-27 (×2): qty 1

## 2019-01-27 MED ORDER — OMEPRAZOLE MAGNESIUM 20 MG PO TBEC: 20.0000 mg | DELAYED_RELEASE_TABLET | Freq: Every day | ORAL | Status: DC

## 2019-01-27 MED ORDER — ASPIRIN 81 MG PO CHEW
81.0000 mg | CHEWABLE_TABLET | Freq: Two times a day (BID) | ORAL | Status: DC
  Administered 2019-01-27 – 2019-01-28 (×2): 81 mg via ORAL
  Filled 2019-01-27 (×2): qty 1

## 2019-01-27 MED ORDER — PHENOL 1.4 % MT LIQD: 1.0000 | OROMUCOSAL | Status: DC | PRN

## 2019-01-27 MED ORDER — SODIUM CHLORIDE (PF) 0.9 % IJ SOLN
INTRAMUSCULAR | Status: AC
  Filled 2019-01-27: qty 50

## 2019-01-27 MED ORDER — ONDANSETRON HCL 4 MG/2ML IJ SOLN
INTRAMUSCULAR | Status: DC | PRN
  Administered 2019-01-27: 4 mg via INTRAVENOUS

## 2019-01-27 MED ORDER — FENTANYL CITRATE (PF) 100 MCG/2ML IJ SOLN
INTRAMUSCULAR | Status: DC | PRN
  Administered 2019-01-27 (×2): 25 ug via INTRAVENOUS
  Administered 2019-01-27: 50 ug via INTRAVENOUS

## 2019-01-27 MED ORDER — KETOROLAC TROMETHAMINE 30 MG/ML IJ SOLN
INTRAMUSCULAR | Status: DC | PRN
  Administered 2019-01-27: 30 mg

## 2019-01-27 MED ORDER — BUPIVACAINE IN DEXTROSE 0.75-8.25 % IT SOLN
INTRATHECAL | Status: DC | PRN
  Administered 2019-01-27: 1.6 mL via INTRATHECAL

## 2019-01-27 MED ORDER — LACTATED RINGERS IV SOLN
INTRAVENOUS | Status: DC | PRN
  Administered 2019-01-27 (×2): via INTRAVENOUS

## 2019-01-27 MED ORDER — ONDANSETRON HCL 4 MG/2ML IJ SOLN: 4.0000 mg | Freq: Four times a day (QID) | INTRAMUSCULAR | Status: DC | PRN

## 2019-01-27 MED ORDER — TRANEXAMIC ACID-NACL 1000-0.7 MG/100ML-% IV SOLN
1000.0000 mg | INTRAVENOUS | Status: AC
  Administered 2019-01-27: 07:00:00 1000 mg via INTRAVENOUS
  Filled 2019-01-27: qty 100

## 2019-01-27 MED ORDER — PROPOFOL 500 MG/50ML IV EMUL
INTRAVENOUS | Status: DC | PRN
  Administered 2019-01-27: 75 ug/kg/min via INTRAVENOUS

## 2019-01-27 MED ORDER — ACETAMINOPHEN 500 MG PO TABS
1000.0000 mg | ORAL_TABLET | Freq: Once | ORAL | Status: AC
  Administered 2019-01-27: 1000 mg via ORAL
  Filled 2019-01-27: qty 2

## 2019-01-27 MED ORDER — MENTHOL 3 MG MT LOZG: 1.0000 | LOZENGE | OROMUCOSAL | Status: DC | PRN

## 2019-01-27 MED ORDER — DOCUSATE SODIUM 100 MG PO CAPS
100.0000 mg | ORAL_CAPSULE | Freq: Two times a day (BID) | ORAL | Status: DC
  Administered 2019-01-27 – 2019-01-28 (×2): 100 mg via ORAL
  Filled 2019-01-27 (×2): qty 1

## 2019-01-27 MED ORDER — ROPIVACAINE HCL 5 MG/ML IJ SOLN
INTRAMUSCULAR | Status: DC | PRN
  Administered 2019-01-27: 30 mL via PERINEURAL

## 2019-01-27 MED ORDER — ALUM & MAG HYDROXIDE-SIMETH 200-200-20 MG/5ML PO SUSP: 15.0000 mL | ORAL | Status: DC | PRN

## 2019-01-27 MED ORDER — DEXAMETHASONE SODIUM PHOSPHATE 10 MG/ML IJ SOLN
10.0000 mg | Freq: Once | INTRAMUSCULAR | Status: AC
  Administered 2019-01-27: 08:00:00 8 mg via INTRAVENOUS

## 2019-01-27 MED ORDER — POVIDONE-IODINE 10 % EX SWAB
2.0000 "application " | Freq: Once | CUTANEOUS | Status: AC
  Administered 2019-01-27: 2 via TOPICAL

## 2019-01-27 MED ORDER — CHLORHEXIDINE GLUCONATE 4 % EX LIQD: 60.0000 mL | Freq: Once | CUTANEOUS | Status: DC

## 2019-01-27 MED ORDER — HYDROMORPHONE HCL 1 MG/ML IJ SOLN: 0.5000 mg | INTRAMUSCULAR | Status: DC | PRN

## 2019-01-27 MED ORDER — PROPOFOL 10 MG/ML IV BOLUS
INTRAVENOUS | Status: AC
  Filled 2019-01-27: qty 80

## 2019-01-27 MED ORDER — ONDANSETRON HCL 4 MG PO TABS: 4.0000 mg | ORAL_TABLET | Freq: Four times a day (QID) | ORAL | Status: DC | PRN

## 2019-01-27 MED ORDER — MIDAZOLAM HCL 5 MG/5ML IJ SOLN
INTRAMUSCULAR | Status: DC | PRN
  Administered 2019-01-27 (×2): 1 mg via INTRAVENOUS

## 2019-01-27 MED ORDER — BUPIVACAINE-EPINEPHRINE (PF) 0.25% -1:200000 IJ SOLN
INTRAMUSCULAR | Status: DC | PRN
  Administered 2019-01-27: 30 mL

## 2019-01-27 MED ORDER — POLYETHYLENE GLYCOL 3350 17 G PO PACK
17.0000 g | PACK | Freq: Two times a day (BID) | ORAL | Status: DC
  Administered 2019-01-27 – 2019-01-28 (×2): 17 g via ORAL
  Filled 2019-01-27 (×2): qty 1

## 2019-01-27 MED ORDER — DEXAMETHASONE SODIUM PHOSPHATE 10 MG/ML IJ SOLN
10.0000 mg | Freq: Once | INTRAMUSCULAR | Status: AC
  Administered 2019-01-28: 10 mg via INTRAVENOUS
  Filled 2019-01-27: qty 1

## 2019-01-27 MED ORDER — EPHEDRINE SULFATE-NACL 50-0.9 MG/10ML-% IV SOSY
PREFILLED_SYRINGE | INTRAVENOUS | Status: DC | PRN
  Administered 2019-01-27 (×4): 5 mg via INTRAVENOUS

## 2019-01-27 MED ORDER — SODIUM CHLORIDE 0.9 % IV SOLN
INTRAVENOUS | Status: DC
  Administered 2019-01-27 – 2019-01-28 (×2): via INTRAVENOUS

## 2019-01-27 MED ORDER — TRANEXAMIC ACID-NACL 1000-0.7 MG/100ML-% IV SOLN
1000.0000 mg | Freq: Once | INTRAVENOUS | Status: AC
  Administered 2019-01-27: 1000 mg via INTRAVENOUS
  Filled 2019-01-27: qty 100

## 2019-01-27 MED ORDER — METHOCARBAMOL 500 MG IVPB - SIMPLE MED
INTRAVENOUS | Status: AC
  Filled 2019-01-27: qty 50

## 2019-01-27 MED ORDER — SODIUM CHLORIDE (PF) 0.9 % IJ SOLN
INTRAMUSCULAR | Status: DC | PRN
  Administered 2019-01-27: 30 mL

## 2019-01-27 MED ORDER — MIDAZOLAM HCL 2 MG/2ML IJ SOLN
INTRAMUSCULAR | Status: AC
  Filled 2019-01-27: qty 2

## 2019-01-27 MED ORDER — FENTANYL CITRATE (PF) 100 MCG/2ML IJ SOLN
INTRAMUSCULAR | Status: AC
  Filled 2019-01-27: qty 2

## 2019-01-27 MED ORDER — MAGNESIUM CITRATE PO SOLN: 1.0000 | Freq: Once | ORAL | Status: DC | PRN

## 2019-01-27 MED ORDER — METHOCARBAMOL 500 MG IVPB - SIMPLE MED
500.0000 mg | Freq: Four times a day (QID) | INTRAVENOUS | Status: DC | PRN
  Administered 2019-01-27: 500 mg via INTRAVENOUS
  Filled 2019-01-27: qty 50

## 2019-01-27 MED ORDER — METOCLOPRAMIDE HCL 5 MG PO TABS: 5.0000 mg | ORAL_TABLET | Freq: Three times a day (TID) | ORAL | Status: DC | PRN

## 2019-01-27 MED ORDER — METHOCARBAMOL 500 MG PO TABS
500.0000 mg | ORAL_TABLET | Freq: Four times a day (QID) | ORAL | Status: DC | PRN
  Administered 2019-01-27: 500 mg via ORAL
  Filled 2019-01-27 (×2): qty 1

## 2019-01-27 MED ORDER — LACTATED RINGERS IV SOLN
INTRAVENOUS | Status: DC
  Administered 2019-01-27: 07:00:00 via INTRAVENOUS

## 2019-01-27 MED ORDER — FERROUS SULFATE 325 (65 FE) MG PO TABS
325.0000 mg | ORAL_TABLET | Freq: Two times a day (BID) | ORAL | Status: DC
  Administered 2019-01-27 – 2019-01-28 (×2): 325 mg via ORAL
  Filled 2019-01-27 (×2): qty 1

## 2019-01-27 MED ORDER — BISACODYL 10 MG RE SUPP: 10.0000 mg | Freq: Every day | RECTAL | Status: DC | PRN

## 2019-01-27 MED ORDER — ATORVASTATIN CALCIUM 10 MG PO TABS: 10.0000 mg | ORAL_TABLET | ORAL | Status: DC

## 2019-01-27 MED ORDER — KETOROLAC TROMETHAMINE 30 MG/ML IJ SOLN
INTRAMUSCULAR | Status: AC
  Filled 2019-01-27: qty 1

## 2019-01-27 MED ORDER — NEBIVOLOL HCL 10 MG PO TABS
20.0000 mg | ORAL_TABLET | Freq: Every day | ORAL | Status: DC
  Administered 2019-01-28: 20 mg via ORAL
  Filled 2019-01-27: qty 2

## 2019-01-27 MED ORDER — DIPHENHYDRAMINE HCL 12.5 MG/5ML PO ELIX: 12.5000 mg | ORAL_SOLUTION | ORAL | Status: DC | PRN

## 2019-01-27 MED ORDER — KETAMINE HCL 10 MG/ML IJ SOLN
INTRAMUSCULAR | Status: AC
  Filled 2019-01-27: qty 1

## 2019-01-27 MED ORDER — PANTOPRAZOLE SODIUM 40 MG PO TBEC
40.0000 mg | DELAYED_RELEASE_TABLET | Freq: Every day | ORAL | Status: DC
  Administered 2019-01-27: 40 mg via ORAL
  Filled 2019-01-27: qty 1

## 2019-01-27 MED ORDER — BUPIVACAINE-EPINEPHRINE (PF) 0.25% -1:200000 IJ SOLN
INTRAMUSCULAR | Status: AC
  Filled 2019-01-27: qty 30

## 2019-01-27 SURGICAL SUPPLY — 59 items
ADH SKN CLS APL DERMABOND .7 (GAUZE/BANDAGES/DRESSINGS) ×1
ATTUNE MED ANAT PAT 35 KNEE (Knees) ×2 IMPLANT
ATTUNE MED ANAT PAT 35MM KNEE (Knees) ×1 IMPLANT
ATTUNE PSFEM LTSZ5 NARCEM KNEE (Femur) ×3 IMPLANT
ATTUNE PSRP INSR SZ5 6 KNEE (Insert) ×2 IMPLANT
ATTUNE PSRP INSR SZ5 6MM KNEE (Insert) ×1 IMPLANT
BAG SPEC THK2 15X12 ZIP CLS (MISCELLANEOUS)
BAG ZIPLOCK 12X15 (MISCELLANEOUS) IMPLANT
BANDAGE ACE 6X5 VEL STRL LF (GAUZE/BANDAGES/DRESSINGS) ×3 IMPLANT
BASE TIBIAL ROT PLAT SZ 5 KNEE (Knees) ×1 IMPLANT
BLADE SAW SGTL 11.0X1.19X90.0M (BLADE) IMPLANT
BLADE SAW SGTL 13.0X1.19X90.0M (BLADE) ×3 IMPLANT
BOWL SMART MIX CTS (DISPOSABLE) ×3 IMPLANT
BSPLAT TIB 5 CMNT ROT PLAT STR (Knees) ×1 IMPLANT
CEMENT HV SMART SET (Cement) ×6 IMPLANT
COVER SURGICAL LIGHT HANDLE (MISCELLANEOUS) ×3 IMPLANT
COVER WAND RF STERILE (DRAPES) IMPLANT
CUFF TOURN SGL QUICK 34 (TOURNIQUET CUFF) ×3
CUFF TRNQT CYL 34X4.125X (TOURNIQUET CUFF) ×1 IMPLANT
DECANTER SPIKE VIAL GLASS SM (MISCELLANEOUS) ×6 IMPLANT
DERMABOND ADVANCED (GAUZE/BANDAGES/DRESSINGS) ×2
DERMABOND ADVANCED .7 DNX12 (GAUZE/BANDAGES/DRESSINGS) ×1 IMPLANT
DRAPE U-SHAPE 47X51 STRL (DRAPES) ×3 IMPLANT
DRESSING AQUACEL AG SP 3.5X10 (GAUZE/BANDAGES/DRESSINGS) ×1 IMPLANT
DRSG AQUACEL AG SP 3.5X10 (GAUZE/BANDAGES/DRESSINGS) ×3
DURAPREP 26ML APPLICATOR (WOUND CARE) ×6 IMPLANT
ELECT REM PT RETURN 15FT ADLT (MISCELLANEOUS) ×3 IMPLANT
GLOVE BIO SURGEON STRL SZ 6 (GLOVE) ×6 IMPLANT
GLOVE BIOGEL PI IND STRL 6.5 (GLOVE) ×2 IMPLANT
GLOVE BIOGEL PI IND STRL 7.5 (GLOVE) ×1 IMPLANT
GLOVE BIOGEL PI INDICATOR 6.5 (GLOVE) ×4
GLOVE BIOGEL PI INDICATOR 7.5 (GLOVE) ×2
GLOVE ORTHO TXT STRL SZ7.5 (GLOVE) ×6 IMPLANT
GOWN STRL REUS W/ TWL LRG LVL3 (GOWN DISPOSABLE) ×1 IMPLANT
GOWN STRL REUS W/TWL LRG LVL3 (GOWN DISPOSABLE) ×8 IMPLANT
HANDPIECE INTERPULSE COAX TIP (DISPOSABLE) ×3
HOLDER FOLEY CATH W/STRAP (MISCELLANEOUS) IMPLANT
KIT TURNOVER KIT A (KITS) IMPLANT
MANIFOLD NEPTUNE II (INSTRUMENTS) ×3 IMPLANT
NDL SAFETY ECLIPSE 18X1.5 (NEEDLE) IMPLANT
NEEDLE HYPO 18GX1.5 SHARP (NEEDLE)
NS IRRIG 1000ML POUR BTL (IV SOLUTION) ×3 IMPLANT
PACK TOTAL KNEE CUSTOM (KITS) ×3 IMPLANT
PIN FIX SIGMA HP QUICK REL (PIN) ×3 IMPLANT
PIN THREADED HEADED SIGMA (PIN) ×3 IMPLANT
PROTECTOR NERVE ULNAR (MISCELLANEOUS) ×3 IMPLANT
SET HNDPC FAN SPRY TIP SCT (DISPOSABLE) ×1 IMPLANT
SET PAD KNEE POSITIONER (MISCELLANEOUS) ×3 IMPLANT
SUT MNCRL AB 4-0 PS2 18 (SUTURE) ×3 IMPLANT
SUT STRATAFIX PDS+ 0 24IN (SUTURE) ×3 IMPLANT
SUT VIC AB 1 CT1 36 (SUTURE) ×3 IMPLANT
SUT VIC AB 2-0 CT1 27 (SUTURE) ×9
SUT VIC AB 2-0 CT1 TAPERPNT 27 (SUTURE) ×3 IMPLANT
SYR 3ML LL SCALE MARK (SYRINGE) ×3 IMPLANT
TIBIAL BASE ROT PLAT SZ 5 KNEE (Knees) ×3 IMPLANT
TRAY FOLEY CATH 14FRSI W/METER (CATHETERS) ×3 IMPLANT
WATER STERILE IRR 1000ML POUR (IV SOLUTION) ×6 IMPLANT
WRAP KNEE MAXI GEL POST OP (GAUZE/BANDAGES/DRESSINGS) ×3 IMPLANT
YANKAUER SUCT BULB TIP 10FT TU (MISCELLANEOUS) ×3 IMPLANT

## 2019-01-27 NOTE — Anesthesia Procedure Notes (Signed)
Anesthesia Regional Block: Adductor canal block   Pre-Anesthetic Checklist: ,, timeout performed, Correct Patient, Correct Site, Correct Laterality, Correct Procedure,, site marked, risks and benefits discussed, Surgical consent,  Pre-op evaluation,  At surgeon's request and post-op pain management  Laterality: Left  Prep: chloraprep       Needles:  Injection technique: Single-shot  Needle Type: Echogenic Stimulator Needle     Needle Length: 10cm  Needle Gauge: 21     Additional Needles:   Procedures:,,,, ultrasound used (permanent image in chart),,,,  Narrative:  Start time: 01/27/2019 6:55 AM End time: 01/27/2019 7:05 AM Injection made incrementally with aspirations every 5 mL.  Performed by: Personally  Anesthesiologist: Murvin Natal, MD  Additional Notes: Functioning IV was confirmed and monitors were applied. A time-out was performed. Hand hygiene and sterile gloves were used. The thigh was placed in a frog-leg position and prepped in a sterile fashion. A 154mm 21ga P[place]unk echogenic stimulator needle was placed using ultrasound guidance.  Negative aspiration and negative test dose prior to incremental administration of local anesthetic. The patient tolerated the procedure well.

## 2019-01-27 NOTE — Anesthesia Postprocedure Evaluation (Signed)
Anesthesia Post Note  Patient: Chloe Holmes  Procedure(s) Performed: TOTAL KNEE ARTHROPLASTY (Left Knee)     Patient location during evaluation: PACU Anesthesia Type: Regional and Spinal Level of consciousness: oriented and awake and alert Pain management: pain level controlled Vital Signs Assessment: post-procedure vital signs reviewed and stable Respiratory status: spontaneous breathing, respiratory function stable and patient connected to nasal cannula oxygen Cardiovascular status: blood pressure returned to baseline and stable Postop Assessment: no headache, no backache, no apparent nausea or vomiting and spinal receding Anesthetic complications: no    Last Vitals:  Vitals:   01/27/19 1234 01/27/19 1344  BP: (!) 145/82 128/72  Pulse: (!) 51 61  Resp: 16 16  Temp: 36.6 C   SpO2: 100% 100%    Last Pain:  Vitals:   01/27/19 1234  TempSrc: Oral  PainSc:                  Lennard Capek P Mathew Storck

## 2019-01-27 NOTE — Transfer of Care (Signed)
Immediate Anesthesia Transfer of Care Note  Patient: Chloe Holmes  Procedure(s) Performed: Procedure(s) with comments: TOTAL KNEE ARTHROPLASTY (Left) - 70 mins  Patient Location: PACU  Anesthesia Type:Spinal  Level of Consciousness:  sedated, patient cooperative and responds to stimulation  Airway & Oxygen Therapy:Patient Spontanous Breathing and Patient connected to face mask oxgen  Post-op Assessment:  Report given to PACU RN and Post -op Vital signs reviewed and stable  Post vital signs:  Reviewed and stable  Last Vitals:  Vitals:   01/27/19 1015 01/27/19 1025  BP:  (!) 145/82  Pulse: 64 (!) 54  Resp: (!) 22 17  Temp: 36.4 C 36.6 C  SpO2: 52% 17%    Complications: No apparent anesthesia complications

## 2019-01-27 NOTE — Anesthesia Procedure Notes (Signed)
Spinal  Patient location during procedure: OR Start time: 01/27/2019 7:15 AM End time: 01/27/2019 7:25 AM Staffing Anesthesiologist: Murvin Natal, MD Performed: anesthesiologist  Preanesthetic Checklist Completed: patient identified, surgical consent, pre-op evaluation, timeout performed, IV checked, risks and benefits discussed and monitors and equipment checked Spinal Block Patient position: sitting Prep: DuraPrep Patient monitoring: cardiac monitor, continuous pulse ox and blood pressure Approach: midline Location: L4-5 Injection technique: single-shot Needle Needle type: Pencan  Needle gauge: 24 G Needle length: 9 cm Assessment Sensory level: T10 Additional Notes Functioning IV was confirmed and monitors were applied. Sterile prep and drape, including hand hygiene and sterile gloves were used. The patient was positioned and the spine was prepped. The skin was anesthetized with lidocaine.  Free flow of clear CSF was obtained prior to injecting local anesthetic into the CSF on the third attempt.  The spinal needle aspirated freely following injection.  The needle was carefully withdrawn.  The patient tolerated the procedure well.

## 2019-01-27 NOTE — Care Plan (Signed)
Ortho Bundle Case Management Note  Patient Details  Name: Chloe Holmes MRN: [id number] Date of Birth: December 04, 1945  L TKA scheduled 01-27-19 DCP:  Home with family.  1 story home with 0 ste. DME:  RW ordered through [employer].  Has elevated toilets, doesn't need a 3-in-1. PT:  [facility].  Eval scheduled on 01-31-2019.                   DME Arranged:  Gilford Rile DME Agency:  [employer]  HH Arranged:  NA [place] Agency:  NA  Additional Comments: Please contact me with any questions of if this plan should need to change.  Marianne Sofia, RN,CCM [facility]  [phone] 01/27/2019, 9:07 AM

## 2019-01-27 NOTE — Interval H&P Note (Signed)
History and Physical Interval Note:  01/27/2019 6:34 AM  Chloe Holmes  has presented today for surgery, with the diagnosis of Left total knee arthroplasty.  The various methods of treatment have been discussed with the patient and family. After consideration of risks, benefits and other options for treatment, the patient has consented to  Procedure(s) with comments: TOTAL KNEE ARTHROPLASTY (Left) - 70 mins as a surgical intervention.  The patient's history has been reviewed, patient examined, no change in status, stable for surgery.  I have reviewed the patient's chart and labs.  Questions were answered to the patient's satisfaction.     Mauri Pole

## 2019-01-27 NOTE — Evaluation (Signed)
Physical Therapy Evaluation Patient Details Name: Chloe Holmes MRN: [id number] DOB: 1945/11/09 Today's Date: 01/27/2019   History of Present Illness  73 yo female s/p L TKR on 01/27/19. PMH includes obesity, HTN, diverticulosis, dyspnea, HLD, C-spine surgery.   Clinical Impression  Pt presents with L knee pain, decreased L knee ROM, difficulty performing bed mobility, increased time and effort to perform mobility tasks, and decreased activity tolerance. Pt to benefit from acute PT to address deficits. Pt ambulated hallway distance with RW with min guard assist, verbal cuing for safety and form provided throughout. Pt educated on ankle pumps (20/hour) to perform this afternoon/evening to increase circulation, to pt's tolerance and limited by pain. PT to progress mobility as tolerated, and will continue to follow acutely.        Follow Up Recommendations Follow surgeon's recommendation for DC plan and follow-up therapies;Supervision for mobility/OOB([facility]- pt states PT will come to pt house)    Equipment Recommendations  Rolling walker with 5" wheels(to be delivered via [employer])    Recommendations for Other Services       Precautions / Restrictions Precautions Precautions: Fall Restrictions Weight Bearing Restrictions: No LLE Weight Bearing: Weight bearing as tolerated      Mobility  Bed Mobility Overal bed mobility: Needs Assistance Bed Mobility: Supine to Sit     Supine to sit: Min assist;HOB elevated     General bed mobility comments: Min assist for LLE management, scooting to EOB. Verbal cuing for sequencing, use of bed rails   Transfers Overall transfer level: Needs assistance Equipment used: Rolling walker (2 wheeled) Transfers: Sit to/from Stand Sit to Stand: Min guard;From elevated surface         General transfer comment: Min guard for safety, verbal cuing for hand placement when rising.   Ambulation/Gait Ambulation/Gait assistance: Min  guard;Min assist;+2 safety/equipment(chair follow) Gait Distance (Feet): 50 Feet Assistive device: Rolling walker (2 wheeled) Gait Pattern/deviations: Step-to pattern;Decreased step length - right;Decreased step length - left;Antalgic;Trunk flexed Gait velocity: decr    General Gait Details: Min guard safety, occasional min assist for LLE guarding. pt with very decreased WB LLE, requires verbal cuing for placement in RW, sequencing, upright posture.   Stairs            Wheelchair Mobility    Modified Rankin (Stroke Patients Only)       Balance Overall balance assessment: Mild deficits observed, not formally tested                                           Pertinent Vitals/Pain Pain Assessment: 0-10 Pain Score: 4  Pain Location: L knee  Pain Descriptors / Indicators: Sore Pain Intervention(s): Monitored during session;Repositioned;Limited activity within patient's tolerance    Home Living Family/patient expects to be discharged to:: Private residence Living Arrangements: Alone Available Help at Discharge: Family;Friend(s);Available 24 hours/day(through Sunday, longer if needed) Type of Home: House Home Access: Stairs to enter Entrance Stairs-Rails: None Entrance Stairs-Number of Steps: 2 Home Layout: One level Home Equipment: Shower seat - built in;Cane - single point      Prior Function Level of Independence: Independent         Comments: Pt reports going to [place] several months of the year, works as a Engineer, technical sales.      Hand Dominance   Dominant Hand: Right    Extremity/Trunk Assessment   Upper Extremity Assessment  Upper Extremity Assessment: Overall WFL for tasks assessed    Lower Extremity Assessment Lower Extremity Assessment: Overall WFL for tasks assessed;LLE deficits/detail LLE Deficits / Details: suspected post-operative weakness; able to perform ankle pumps, quad set, heel slide to 30*, SLR without lift assist and without  quad lag  LLE Sensation: WNL    Cervical / Trunk Assessment Cervical / Trunk Assessment: Normal  Communication   Communication: No difficulties  Cognition Arousal/Alertness: Awake/alert Behavior During Therapy: WFL for tasks assessed/performed Overall Cognitive Status: Within Functional Limits for tasks assessed                                        General Comments      Exercises Total Joint Exercises Goniometric ROM: knee aarom ~5-30*, limited by pain and guarding   Assessment/Plan    PT Assessment Patient needs continued PT services  PT Problem List Decreased strength;Decreased mobility;Decreased range of motion;Decreased activity tolerance;Decreased balance;Decreased knowledge of use of DME;Pain       PT Treatment Interventions DME instruction;Functional mobility training;Balance training;Patient/family education;Gait training;Therapeutic activities;Stair training;Therapeutic exercise    PT Goals (Current goals can be found in the Care Plan section)  Acute Rehab PT Goals Patient Stated Goal: decrease L knee pain  PT Goal Formulation: With patient Time For Goal Achievement: 02/03/19 Potential to Achieve Goals: Good    Frequency 7X/week   Barriers to discharge        Co-evaluation               AM-PAC PT "6 Clicks" Mobility  Outcome Measure Help needed turning from your back to your side while in a flat bed without using bedrails?: A Little Help needed moving from lying on your back to sitting on the side of a flat bed without using bedrails?: A Little Help needed moving to and from a bed to a chair (including a wheelchair)?: A Little Help needed standing up from a chair using your arms (e.g., wheelchair or bedside chair)?: A Little Help needed to walk in hospital room?: A Little Help needed climbing 3-5 steps with a railing? : A Lot 6 Click Score: 17    End of Session Equipment Utilized During Treatment: Gait belt Activity Tolerance:  Patient tolerated treatment well;Patient limited by pain Patient left: in chair;with chair alarm set;with call bell/phone within reach;with SCD's reapplied Nurse Communication: Mobility status PT Visit Diagnosis: Other abnormalities of gait and mobility (R26.89);Difficulty in walking, not elsewhere classified (R26.2)    Time: 1636-1700 PT Time Calculation (min) (ACUTE ONLY): 24 min   Charges:   PT Evaluation $PT Eval Low Complexity: 1 Low PT Treatments $Gait Training: 8-22 mins       Julien Girt, PT Acute Rehabilitation Services Pager [phone]  Office [phone]  Roxine Caddy D Elonda Husky 01/27/2019, 6:26 PM

## 2019-01-27 NOTE — Op Note (Signed)
NAME:  Chloe Holmes RECORD NO.:  [id number]                             FACILITY:  [facility]      PHYSICIAN:  Pietro Cassis. Alvan Dame, M.D.  DATE OF BIRTH:  [birth year]      DATE OF PROCEDURE:  01/27/2019                                     OPERATIVE REPORT         PREOPERATIVE DIAGNOSIS:  Left knee osteoarthritis.      POSTOPERATIVE DIAGNOSIS:  Left knee osteoarthritis.      FINDINGS:  The patient was noted to have complete loss of cartilage and   bone-on-bone arthritis with associated osteophytes in the patellofemoral and lateral compartments of   the knee with a significant synovitis and associated effusion.  The patient had failed months of conservative treatment including medications, injection therapy, activity modification.     PROCEDURE:  Left total knee replacement.      COMPONENTS USED:  DePuy Attune rotating platform posterior stabilized knee   system, a size 5N femur, 5 tibia, size 6 mm PS AOX insert, and 35 anatomic patellar   button.      SURGEON:  Pietro Cassis. Alvan Dame, M.D.      ASSISTANT:  Griffith Citron, PA-C.      ANESTHESIA:  Regional and Spinal.      SPECIMENS:  None.      COMPLICATION:  None.      DRAINS:  None.  EBL: <100cc      TOURNIQUET TIME:   Total Tourniquet Time Documented: Thigh (Left) - 36 minutes Total: Thigh (Left) - 36 minutes  .      The patient was stable to the recovery room.      INDICATION FOR PROCEDURE:  Chloe Holmes is a 73 y.o. female patient of   mine.  The patient had been seen, evaluated, and treated for months conservatively in the   office with medication, activity modification, and injections.  The patient had   radiographic changes of bone-on-bone arthritis with endplate sclerosis and osteophytes noted.  Based on the radiographic changes and failed conservative measures, the patient   decided to proceed with definitive treatment, total knee replacement.  Risks of infection, DVT, component  failure, need for revision surgery, neurovascular injury were reviewed in the office setting.  The postop course was reviewed stressing the efforts to maximize post-operative satisfaction and function.  Consent was obtained for benefit of pain   relief.      PROCEDURE IN DETAIL:  The patient was brought to the operative theater.   Once adequate anesthesia, preoperative antibiotics, 2 gm of Ancef,1 gm of Tranexamic Acid, and 10 mg of Decadron administered, the patient was positioned supine with a left thigh tourniquet placed.  The  left lower extremity was prepped and draped in sterile fashion.  A time-   out was performed identifying the patient, planned procedure, and the appropriate extremity.      The left lower extremity was placed in the [facility] leg holder.  The leg was   exsanguinated, tourniquet elevated to 250 mmHg.  A midline incision was   made  followed by median parapatellar arthrotomy.  Following initial   exposure, attention was first directed to the patella.  Precut   measurement was noted to be 23 mm.  I resected down to 13 mm and used a   35 anatomic patellar button to restore patellar height as well as cover the cut surface.      The lug holes were drilled and a metal shim was placed to protect the   patella from retractors and saw blade during the procedure.      At this point, attention was now directed to the femur.  The femoral   canal was opened with a drill, irrigated to try to prevent fat emboli.  An   intramedullary rod was passed at 3 degrees valgus, 9 mm of bone was   resected off the distal femur.  Following this resection, the tibia was   subluxated anteriorly.  Using the extramedullary guide, 2 mm of bone was resected off   the proximal medial tibia.  We confirmed the gap would be   stable medially and laterally with a size 5 spacer block as well as confirmed that the tibial cut was perpendicular in the coronal plane, checking with an alignment rod.      Once  this was done, I sized the femur to be a size 5 in the anterior-   posterior dimension, chose a narrow component based on medial and   lateral dimension.  The size 5 rotation block was then pinned in   position anterior referenced using the C-clamp to set rotation.  The   anterior, posterior, and  chamfer cuts were made without difficulty nor   notching making certain that I was along the anterior cortex to help   with flexion gap stability.      The final box cut was made off the lateral aspect of distal femur.      At this point, the tibia was sized to be a size 5.  The size 5 tray was   then pinned in position through the medial third of the tubercle,   drilled, and keel punched.  Trial reduction was now carried with a 5 femur,  5 tibia, a size 6 mm PS insert, and the 35 anatomic patella botton.  The knee was brought to full extension with good flexion stability with the patella   tracking through the trochlea without application of pressure.  Given   all these findings the trial components removed.  Final components were   opened and cement was mixed.  The knee was irrigated with normal saline solution and pulse lavage.  The synovial lining was   then injected with 30 cc of 0.25% Marcaine with epinephrine, 1 cc of Toradol and 30 cc of NS for a total of 61 cc.     Final implants were then cemented onto cleaned and dried cut surfaces of bone with the knee brought to extension with a size 5 mm PS trial insert.      Once the cement had fully cured, excess cement was removed   throughout the knee.  I confirmed that I was satisfied with the range of   motion and stability, and the final size 5 mm PS AOX insert was chosen.  It was   placed into the knee.      The tourniquet had been let down at 36 minutes.  No significant   hemostasis was required.  The extensor mechanism was then reapproximated using #1 Vicryl and #  1 Stratafix sutures with the knee   in flexion.  The   remaining wound was  closed with 2-0 Vicryl and running 4-0 Monocryl.   The knee was cleaned, dried, dressed sterilely using Dermabond and   Aquacel dressing.  The patient was then   brought to recovery room in stable condition, tolerating the procedure   well.   Please note that Physician Assistant, Griffith Citron, PA-C was present for the entirety of the case, and was utilized for pre-operative positioning, peri-operative retractor management, general facilitation of the procedure and for primary wound closure at the end of the case.              Pietro Cassis Alvan Dame, M.D.    01/27/2019 8:52 AM

## 2019-01-27 NOTE — Discharge Instructions (Signed)

## 2019-01-28 ENCOUNTER — Encounter (HOSPITAL_COMMUNITY): Payer: Self-pay | Admitting: Orthopedic Surgery

## 2019-01-28 DIAGNOSIS — Z6839 Body mass index (BMI) 39.0-39.9, adult: Secondary | ICD-10-CM | POA: Diagnosis not present

## 2019-01-28 DIAGNOSIS — E669 Obesity, unspecified: Secondary | ICD-10-CM | POA: Diagnosis not present

## 2019-01-28 DIAGNOSIS — M1712 Unilateral primary osteoarthritis, left knee: Secondary | ICD-10-CM | POA: Diagnosis not present

## 2019-01-28 DIAGNOSIS — I10 Essential (primary) hypertension: Secondary | ICD-10-CM | POA: Diagnosis not present

## 2019-01-28 DIAGNOSIS — K219 Gastro-esophageal reflux disease without esophagitis: Secondary | ICD-10-CM | POA: Diagnosis not present

## 2019-01-28 DIAGNOSIS — K449 Diaphragmatic hernia without obstruction or gangrene: Secondary | ICD-10-CM | POA: Diagnosis not present

## 2019-01-28 LAB — BASIC METABOLIC PANEL
Anion gap: 6 (ref 5–15)
BUN: 16 mg/dL (ref 8–23)
CO2: 25 mmol/L (ref 22–32)
Calcium: 8.3 mg/dL — ABNORMAL LOW (ref 8.9–10.3)
Chloride: 108 mmol/L (ref 98–111)
Creatinine, Ser: 0.83 mg/dL (ref 0.44–1.00)
GFR calc Af Amer: >60 mL/min (ref >60)
GFR calc non Af Amer: >60 mL/min (ref >60)
Glucose, Bld: 155 mg/dL — ABNORMAL HIGH (ref 70–99)
Potassium: 4.5 mmol/L (ref 3.5–5.1)
Sodium: 139 mmol/L (ref 135–145)

## 2019-01-28 LAB — CBC
HCT: 32.6 % — ABNORMAL LOW (ref 36.0–46.0)
Hemoglobin: 10.4 g/dL — ABNORMAL LOW (ref 12.0–15.0)
MCH: 30.1 pg (ref 26.0–34.0)
MCHC: 31.9 g/dL (ref 30.0–36.0)
MCV: 94.5 fL (ref 80.0–100.0)
Platelets: 184 10*3/uL (ref 150–400)
RBC: 3.45 MIL/uL — ABNORMAL LOW (ref 3.87–5.11)
RDW: 13 % (ref 11.5–15.5)
WBC: 12.7 10*3/uL — ABNORMAL HIGH (ref 4.0–10.5)
nRBC: 0 % (ref 0.0–0.2)

## 2019-01-28 MED ORDER — ASPIRIN 81 MG PO CHEW: 81.0000 mg | CHEWABLE_TABLET | Freq: Two times a day (BID) | ORAL | 0 refills | Status: AC

## 2019-01-28 MED ORDER — DOCUSATE SODIUM 100 MG PO CAPS: 100.0000 mg | ORAL_CAPSULE | Freq: Two times a day (BID) | ORAL | 0 refills | Status: DC

## 2019-01-28 MED ORDER — HYDROCODONE-ACETAMINOPHEN 7.5-325 MG PO TABS: 1.0000 | ORAL_TABLET | ORAL | 0 refills | Status: DC | PRN

## 2019-01-28 MED ORDER — FERROUS SULFATE 325 (65 FE) MG PO TABS: 325.0000 mg | ORAL_TABLET | Freq: Three times a day (TID) | ORAL | 3 refills | Status: DC

## 2019-01-28 MED ORDER — METHOCARBAMOL 500 MG PO TABS: 500.0000 mg | ORAL_TABLET | Freq: Four times a day (QID) | ORAL | 0 refills | Status: DC | PRN

## 2019-01-28 MED ORDER — POLYETHYLENE GLYCOL 3350 17 G PO PACK: 17.0000 g | PACK | Freq: Two times a day (BID) | ORAL | 0 refills | Status: DC

## 2019-01-28 NOTE — Plan of Care (Signed)
Patient discharged home, she is waiting on her ride

## 2019-01-28 NOTE — Progress Notes (Signed)
Physical Therapy Treatment Patient Details Name: Chloe Holmes MRN: [id number] DOB: [birth year] Today's Date: 01/28/2019    History of Present Illness 73 yo female s/p L TKR on 01/27/19. PMH includes obesity, HTN, diverticulosis, dyspnea, HLD, C-spine surgery.     PT Comments    POD # 1 pm session Assisted to bathroom then amb in hallway to practice one step pt has to enter her home.  Then returned to room to perform some TE's following HEP handout.  Instructed on proper tech, freq as well as use of ICE.   Addressed all mobility questions, discussed appropriate activity, educated on use of ICE.  Pt ready for D/C to home.   Follow Up Recommendations  Follow surgeon's recommendation for DC plan and follow-up therapies;Supervision for mobility/OOB     Equipment Recommendations  Rolling walker with 5" wheels    Recommendations for Other Services       Precautions / Restrictions Precautions Precautions: Fall Precaution Comments: reviewed no pillow under knee Restrictions Weight Bearing Restrictions: No LLE Weight Bearing: Weight bearing as tolerated    Mobility  Bed Mobility Overal bed mobility: Needs Assistance Bed Mobility: Supine to Sit           General bed mobility comments: OOB in recliner   Transfers Overall transfer level: Needs assistance Equipment used: Rolling walker (2 wheeled) Transfers: Sit to/from Stand Sit to Stand: Min guard;From elevated surface         General transfer comment: Min guard for safety, verbal cuing for hand placement when rising.  Also assisted with toilet transfer  Ambulation/Gait Ambulation/Gait assistance: Supervision Gait Distance (Feet): 55 Feet Assistive device: Rolling walker (2 wheeled) Gait Pattern/deviations: Step-to pattern;Decreased step length - right;Decreased step length - left;Antalgic;Trunk flexed Gait velocity: decreased    General Gait Details: 25% VC's on safety with turns and proper walker to self  distance.     Stairs Stairs: Yes Stairs assistance: Min guard;Supervision Stair Management: No rails;Step to pattern;Forwards;With walker Number of Stairs: 1 General stair comments: 25% VC's on proper tech and walker placement.  Practiced twice.     Wheelchair Mobility    Modified Rankin (Stroke Patients Only)       Balance                                            Cognition Arousal/Alertness: Awake/alert Behavior During Therapy: WFL for tasks assessed/performed Overall Cognitive Status: Within Functional Limits for tasks assessed                                        Exercises   Total Knee Replacement TE's 10 reps B LE ankle pumps 10 reps SAQ's 10 reps SLR's 10 reps ABD Followed by ICE    General Comments        Pertinent Vitals/Pain Pain Assessment: 0-10 Pain Score: 7  Pain Location: L knee  Pain Descriptors / Indicators: Discomfort;Tender;Operative site guarding Pain Intervention(s): Monitored during session;Repositioned;Premedicated before session;Ice applied    Home Living                      Prior Function            PT Goals (current goals can now be found in the care plan section) Progress towards PT  goals: Progressing toward goals    Frequency    7X/week      PT Plan Current plan remains appropriate    Co-evaluation              AM-PAC PT "6 Clicks" Mobility   Outcome Measure  Help needed turning from your back to your side while in a flat bed without using bedrails?: A Little Help needed moving from lying on your back to sitting on the side of a flat bed without using bedrails?: A Little Help needed moving to and from a bed to a chair (including a wheelchair)?: A Little Help needed standing up from a chair using your arms (e.g., wheelchair or bedside chair)?: A Little Help needed to walk in hospital room?: A Little Help needed climbing 3-5 steps with a railing? : A Lot 6 Click  Score: 17    End of Session Equipment Utilized During Treatment: Gait belt Activity Tolerance: Patient tolerated treatment well;Patient limited by pain Patient left: in chair;with chair alarm set;with call bell/phone within reach Nurse Communication: (pt ready for D/C to home) PT Visit Diagnosis: Other abnormalities of gait and mobility (R26.89);Difficulty in walking, not elsewhere classified (R26.2)     Time: 1405-1430 PT Time Calculation (min) (ACUTE ONLY): 25 min  Charges:  $Gait Training: 8-22 mins $Therapeutic Activity: 8-22 mins                     Rica Koyanagi  PTA Acute  Rehabilitation Services Pager      [phone] Office      [phone]

## 2019-01-28 NOTE — Progress Notes (Signed)
Physical Therapy Treatment Patient Details Name: Chloe Holmes MRN: [id number] DOB: 1945-11-14 Today's Date: 01/28/2019    History of Present Illness 73 yo female s/p L TKR on 01/27/19. PMH includes obesity, HTN, diverticulosis, dyspnea, HLD, C-spine surgery.     PT Comments    POD # 1 am session Assisted OOB to amb.  General Gait Details: 25% VC's on safety with turns and proper walker to self distance.  Then returned to room to perform some TE's following HEP handout.  Instructed on proper tech, freq as well as use of ICE.      Follow Up Recommendations  Follow surgeon's recommendation for DC plan and follow-up therapies;Supervision for mobility/OOB     Equipment Recommendations  Rolling walker with 5" wheels    Recommendations for Other Services       Precautions / Restrictions Precautions Precautions: Fall Precaution Comments: reviewed no pillow under knee Restrictions Weight Bearing Restrictions: No LLE Weight Bearing: Weight bearing as tolerated    Mobility  Bed Mobility Overal bed mobility: Needs Assistance Bed Mobility: Supine to Sit           General bed mobility comments: demonstarted and instructed how to use a belt to self assist LE off bed  Transfers Overall transfer level: Needs assistance Equipment used: Rolling walker (2 wheeled) Transfers: Sit to/from Stand Sit to Stand: Min guard;From elevated surface         General transfer comment: Min guard for safety, verbal cuing for hand placement when rising.   Ambulation/Gait Ambulation/Gait assistance: Supervision;Min guard Gait Distance (Feet): 65 Feet Assistive device: Rolling walker (2 wheeled) Gait Pattern/deviations: Step-to pattern;Decreased step length - right;Decreased step length - left;Antalgic;Trunk flexed Gait velocity: decreased    General Gait Details: 25% VC's on safety with turns and proper walker to self distance.     Stairs             Wheelchair Mobility     Modified Rankin (Stroke Patients Only)       Balance                                            Cognition Arousal/Alertness: Awake/alert Behavior During Therapy: WFL for tasks assessed/performed Overall Cognitive Status: Within Functional Limits for tasks assessed                                        Exercises   Total Knee Replacement TE's 10 reps B LE ankle pumps 10 reps towel squeezes 10 reps knee presses 10 reps heel slides   Followed by ICE    General Comments        Pertinent Vitals/Pain Pain Assessment: 0-10 Pain Score: 6  Pain Location: L knee  Pain Descriptors / Indicators: Discomfort;Tender Pain Intervention(s): Monitored during session;Premedicated before session;Repositioned;Ice applied    Home Living                      Prior Function            PT Goals (current goals can now be found in the care plan section) Progress towards PT goals: Progressing toward goals    Frequency           PT Plan Current plan remains appropriate    Co-evaluation  AM-PAC PT "6 Clicks" Mobility   Outcome Measure  Help needed turning from your back to your side while in a flat bed without using bedrails?: A Little Help needed moving from lying on your back to sitting on the side of a flat bed without using bedrails?: A Little Help needed moving to and from a bed to a chair (including a wheelchair)?: A Little Help needed standing up from a chair using your arms (e.g., wheelchair or bedside chair)?: A Little Help needed to walk in hospital room?: A Little Help needed climbing 3-5 steps with a railing? : A Lot 6 Click Score: 17    End of Session Equipment Utilized During Treatment: Gait belt Activity Tolerance: Patient tolerated treatment well;Patient limited by pain Patient left: in chair;with chair alarm set;with call bell/phone within reach   PT Visit Diagnosis: Other abnormalities of gait  and mobility (R26.89);Difficulty in walking, not elsewhere classified (R26.2)     Time: 5035-4656 PT Time Calculation (min) (ACUTE ONLY): 25 min  Charges:  $Gait Training: 8-22 mins $Therapeutic Exercise: 8-22 mins                     Rica Koyanagi  PTA Acute  Rehabilitation Services Pager      [phone] Office      [phone]

## 2019-01-28 NOTE — Progress Notes (Signed)
     Subjective: 1 Day Post-Op Procedure(s) (LRB): TOTAL KNEE ARTHROPLASTY (Left)   Seen by Dr. Alvan Dame.  Patient reports pain as mild, pain controlled. No events throughout the night. Looking forward to improving and getting back to normal life.  Ready to be discharged home, if she does well with PT.    Patient's anticipated LOS is less than 2 midnights, meeting these requirements: - Lives within 1 hour of care - Has a competent adult at home to recover with post-op recover - NO history of  - Chronic pain requiring opiods  - Diabetes  - Coronary Artery Disease  - Heart failure  - Heart attack  - Stroke  - DVT/VTE  - Cardiac arrhythmia  - Respiratory Failure/COPD  - Renal failure  - Anemia  - Advanced Liver disease     Objective:   VITALS:   Vitals:   01/28/19 0139 01/28/19 0523  BP: 119/68 127/74  Pulse: (!) 51 (!) 51  Resp: 16 16  Temp: 97.7 F (36.5 C) 97.7 F (36.5 C)  SpO2: 99% 100%    Dorsiflexion/Plantar flexion intact Incision: dressing C/D/I No cellulitis present Compartment soft  LABS Recent Labs    01/28/19 0423  HGB 10.4*  HCT 32.6*  WBC 12.7*  PLT 184    Recent Labs    01/28/19 0423  NA 139  K 4.5  BUN 16  CREATININE 0.83  GLUCOSE 155*     Assessment/Plan: 1 Day Post-Op Procedure(s) (LRB): TOTAL KNEE ARTHROPLASTY (Left) Foley cath d/c'ed Advance diet Up with therapy D/C IV fluids Discharge home Follow up in 2 weeks at [facility] Follow up with OLIN,Shawntell Dixson D in 2 weeks.  Contact information:  [facility]) [address], [place] [phone]    Obese (BMI 30-39.9) Estimated body mass index is 39.01 kg/m as calculated from the following:   Height as of this encounter: 5\' 4"  (1.626 m).   Weight as of this encounter: 103.1 kg. Patient also counseled that weight may inhibit the healing process Patient counseled that losing  weight will help with future health issues       West Pugh. Dennys Guin   PAC  01/28/2019, 8:04 AM

## 2019-01-31 DIAGNOSIS — M1712 Unilateral primary osteoarthritis, left knee: Secondary | ICD-10-CM | POA: Diagnosis not present

## 2019-02-01 NOTE — Discharge Summary (Signed)
Physician Discharge Summary  Patient ID: Chloe Holmes MRN: [id number] DOB/AGE: [id number] y.o.  Admit date: 01/27/2019 Discharge date: 01/28/2019   Procedures:  Procedure(s) (LRB): TOTAL KNEE ARTHROPLASTY (Left)  Attending Physician:  Dr. 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   GERD (gastroesophageal reflux disease)    History of hiatal hernia    HLD (hyperlipidemia)    Hypertension     HPI:    Chloe Holmes, 73 y.o. female, has a history of pain and functional disability in the left knee due to arthritis and has failed non-surgical conservative treatments for greater than 12 weeks to includeNSAID's and/or analgesics, corticosteriod injections, viscosupplementation injections and activity modification.  Onset of symptoms was gradual, starting 5 years ago with gradually worsening course since that time. The patient noted prior procedures on the knee to include  arthroscopy on the left knee(s).  Patient currently rates pain in the left knee(s) at 10 out of 10 with activity. Patient has worsening of pain with activity and weight bearing, pain that interferes with activities of daily living, pain with passive range of motion, crepitus and joint swelling.  Patient has evidence of periarticular osteophytes and joint space narrowing by imaging studies. There is no active infection.  Risks, benefits and expectations were discussed with the  patient.  Risks including but not limited to the risk of anesthesia, blood clots, nerve damage, blood vessel damage, failure of the prosthesis, infection and up to and including death.  Patient understand the risks, benefits and expectations and wishes to proceed with surgery.   PCP: Shon Baton, MD   Discharged Condition: good  Hospital Course:  Patient underwent the above stated procedure on 01/27/2019. Patient tolerated the procedure well and brought to the recovery room in good condition and subsequently to the floor.  POD #1 BP: 127/74 ; Pulse: 51 ; Temp: 97.7 F (36.5 C) ; Resp: 16 Patient reports pain as mild, pain controlled. No events throughout the night. Looking forward to improving and getting back to normal life.  Ready to be discharged home. Dorsiflexion/plantar flexion intact, incision: dressing C/D/I, no cellulitis present and compartment soft.   LABS  Basename    HGB     10.4  HCT     32.6    Discharge Exam: General appearance: alert, cooperative and no distress Extremities: Homans sign is negative, no sign of DVT, no edema, redness or tenderness in the calves or thighs and no ulcers, gangrene or trophic changes  Disposition: Home with follow up in 2 weeks   Follow-up Information    Danae Orleans, PA-C. Go on 02/10/2019.   Specialty:  Orthopedic Surgery Why:  You are scheduled for a post-operative appointment on 02-10-19 at 3:15 pm.  Contact information: [address] [place] [address] [phone]           Discharge Instructions  Call MD / Call 911   Complete by:  As directed    If you experience chest pain or shortness of breath, CALL 911 and be transported to the hospital emergency room.  If you develope a fever above 101 F, pus (white drainage) or increased drainage or redness at the wound, or calf pain, call your surgeon's office.   Change dressing   Complete by:  As directed    Maintain surgical dressing until follow up in the  clinic. If the edges start to pull up, may reinforce with tape. If the dressing is no longer working, may remove and cover with gauze and tape, but must keep the area dry and clean.  Call with any questions or concerns.   Constipation Prevention   Complete by:  As directed    Drink plenty of fluids.  Prune juice may be helpful.  You may use a stool softener, such as Colace (over the counter) 100 mg twice a day.  Use MiraLax (over the counter) for constipation as needed.   Diet - low sodium heart healthy   Complete by:  As directed    Discharge instructions   Complete by:  As directed    Maintain surgical dressing until follow up in the clinic. If the edges start to pull up, may reinforce with tape. If the dressing is no longer working, may remove and cover with gauze and tape, but must keep the area dry and clean.  [facility]. Call with any questions or concerns.   Increase activity slowly as tolerated   Complete by:  As directed    Weight bearing as tolerated with assist device (walker, cane, etc) as directed, use it as long as suggested by your surgeon or therapist, typically at least 4-6 weeks.   TED hose   Complete by:  As directed    Use stockings (TED hose) for 2 weeks on both leg(s).  You may remove them at night for sleeping.      Allergies as of 01/28/2019      Reactions   Morphine Other (See Comments)   Hypotension; had to be narcan'd . This was 40 years ago       Medication List    STOP taking these medications   diclofenac 50 MG EC tablet Commonly known as:  VOLTAREN   diclofenac sodium 1 % Gel Commonly known as:  VOLTAREN     TAKE these medications   ALPRAZolam 0.25 MG tablet Commonly known as:  XANAX Take 0.25 mg by mouth at bedtime as needed for anxiety.   aspirin 81 MG chewable tablet Commonly known as:  Aspirin Childrens Chew 1 tablet (81 mg total) by mouth 2 (two) times daily for 30 days. Take for 4 weeks, then resume  regular dose.   atorvastatin 10 MG tablet Commonly known as:  LIPITOR Take 10 mg by mouth every Monday, Wednesday, and Friday at 8 PM. AT NIGHT   Bystolic 20 MG Tabs Generic drug:  Nebivolol HCl Take 20 mg by mouth daily.   docusate sodium 100 MG capsule Commonly known as:  Colace Take 1 capsule (100 mg total) by mouth 2 (two) times daily.   ferrous sulfate 325 (65 FE) MG tablet Commonly known as:  FerrouSul Take 1 tablet (325 mg total) by mouth 3 (three) times daily with meals.   hydrochlorothiazide 25 MG tablet Commonly known as:  HYDRODIURIL Take 25 mg by mouth daily.   HYDROcodone-acetaminophen 7.5-325 MG  tablet Commonly known as:  Norco Take 1-2 tablets by mouth every 4 (four) hours as needed for moderate pain.   methocarbamol 500 MG tablet Commonly known as:  Robaxin Take 1 tablet (500 mg total) by mouth every 6 (six) hours as needed for muscle spasms.   omeprazole 20 MG tablet Commonly known as:  PRILOSEC OTC Take 20 mg by mouth at bedtime.   polyethylene glycol 17 g packet Commonly known as:  MIRALAX / GLYCOLAX Take 17 g by mouth 2 (two) times daily. What changed:    how much to take  when to take this   TURMERIC PO Take 1 capsule by mouth daily with lunch.            Discharge Care Instructions  (From admission, onward)         Start     Ordered   01/28/19 0000  Change dressing    Comments:  Maintain surgical dressing until follow up in the clinic. If the edges start to pull up, may reinforce with tape. If the dressing is no longer working, may remove and cover with gauze and tape, but must keep the area dry and clean.  Call with any questions or concerns.   01/28/19 0962           Signed: [place] Pugh. Yazan Gatling   PA-C  02/01/2019, 8:16 AM

## 2019-02-02 DIAGNOSIS — M1712 Unilateral primary osteoarthritis, left knee: Secondary | ICD-10-CM | POA: Diagnosis not present

## 2019-02-04 DIAGNOSIS — M1712 Unilateral primary osteoarthritis, left knee: Secondary | ICD-10-CM | POA: Diagnosis not present

## 2019-02-07 DIAGNOSIS — M1712 Unilateral primary osteoarthritis, left knee: Secondary | ICD-10-CM | POA: Diagnosis not present

## 2019-02-09 DIAGNOSIS — M1712 Unilateral primary osteoarthritis, left knee: Secondary | ICD-10-CM | POA: Diagnosis not present

## 2019-02-11 DIAGNOSIS — M1712 Unilateral primary osteoarthritis, left knee: Secondary | ICD-10-CM | POA: Diagnosis not present

## 2019-02-14 DIAGNOSIS — M1712 Unilateral primary osteoarthritis, left knee: Secondary | ICD-10-CM | POA: Diagnosis not present

## 2019-02-17 DIAGNOSIS — M1712 Unilateral primary osteoarthritis, left knee: Secondary | ICD-10-CM | POA: Diagnosis not present

## 2019-02-21 DIAGNOSIS — M1712 Unilateral primary osteoarthritis, left knee: Secondary | ICD-10-CM | POA: Diagnosis not present

## 2019-02-23 DIAGNOSIS — M1712 Unilateral primary osteoarthritis, left knee: Secondary | ICD-10-CM | POA: Diagnosis not present

## 2019-02-28 DIAGNOSIS — M1712 Unilateral primary osteoarthritis, left knee: Secondary | ICD-10-CM | POA: Diagnosis not present

## 2019-03-03 DIAGNOSIS — M1712 Unilateral primary osteoarthritis, left knee: Secondary | ICD-10-CM | POA: Diagnosis not present

## 2019-03-07 DIAGNOSIS — M1712 Unilateral primary osteoarthritis, left knee: Secondary | ICD-10-CM | POA: Diagnosis not present

## 2019-03-10 DIAGNOSIS — M1712 Unilateral primary osteoarthritis, left knee: Secondary | ICD-10-CM | POA: Diagnosis not present

## 2019-03-15 DIAGNOSIS — M1712 Unilateral primary osteoarthritis, left knee: Secondary | ICD-10-CM | POA: Diagnosis not present

## 2019-03-16 DIAGNOSIS — Z96652 Presence of left artificial knee joint: Secondary | ICD-10-CM | POA: Diagnosis not present

## 2019-03-16 DIAGNOSIS — Z471 Aftercare following joint replacement surgery: Secondary | ICD-10-CM | POA: Diagnosis not present

## 2019-03-17 DIAGNOSIS — M1712 Unilateral primary osteoarthritis, left knee: Secondary | ICD-10-CM | POA: Diagnosis not present

## 2019-03-21 DIAGNOSIS — M1712 Unilateral primary osteoarthritis, left knee: Secondary | ICD-10-CM | POA: Diagnosis not present

## 2019-03-23 DIAGNOSIS — M1712 Unilateral primary osteoarthritis, left knee: Secondary | ICD-10-CM | POA: Diagnosis not present

## 2019-03-28 DIAGNOSIS — M1712 Unilateral primary osteoarthritis, left knee: Secondary | ICD-10-CM | POA: Diagnosis not present

## 2019-03-30 DIAGNOSIS — M1712 Unilateral primary osteoarthritis, left knee: Secondary | ICD-10-CM | POA: Diagnosis not present

## 2019-05-27 DIAGNOSIS — Z23 Encounter for immunization: Secondary | ICD-10-CM | POA: Diagnosis not present

## 2019-06-24 DIAGNOSIS — E669 Obesity, unspecified: Secondary | ICD-10-CM | POA: Diagnosis not present

## 2019-06-24 DIAGNOSIS — M1990 Unspecified osteoarthritis, unspecified site: Secondary | ICD-10-CM | POA: Diagnosis not present

## 2019-06-24 DIAGNOSIS — I10 Essential (primary) hypertension: Secondary | ICD-10-CM | POA: Diagnosis not present

## 2019-07-13 DIAGNOSIS — L821 Other seborrheic keratosis: Secondary | ICD-10-CM | POA: Diagnosis not present

## 2019-07-13 DIAGNOSIS — L814 Other melanin hyperpigmentation: Secondary | ICD-10-CM | POA: Diagnosis not present

## 2019-07-13 DIAGNOSIS — D225 Melanocytic nevi of trunk: Secondary | ICD-10-CM | POA: Diagnosis not present

## 2019-07-13 DIAGNOSIS — L309 Dermatitis, unspecified: Secondary | ICD-10-CM | POA: Diagnosis not present

## 2019-07-13 DIAGNOSIS — L570 Actinic keratosis: Secondary | ICD-10-CM | POA: Diagnosis not present

## 2019-09-15 ENCOUNTER — Ambulatory Visit: Payer: Medicare Other | Attending: Internal Medicine

## 2019-09-15 DIAGNOSIS — Z23 Encounter for immunization: Secondary | ICD-10-CM | POA: Insufficient documentation

## 2019-09-15 NOTE — Progress Notes (Signed)
   Covid-19 Vaccination Clinic  Name:  Chloe Holmes    MRN: [id number] DOB: 04-Mar-1946  09/15/2019  [name] was observed post Covid-19 immunization for 15 minutes without incidence. She was provided with Vaccine Information Sheet and instruction to access the V-Safe system.   [name] was instructed to call 911 with any severe reactions post vaccine: [name] Difficulty breathing  . Swelling of your face and throat  . A fast heartbeat  . A bad rash all over your body  . Dizziness and weakness    Immunizations Administered    Name Date Dose VIS Date Route   Pfizer COVID-19 Vaccine 09/15/2019  8:41 AM 0.3 mL 08/05/2019 Intramuscular   Manufacturer: [place]   Lot: [id number]   [place]: [id number]

## 2019-10-05 ENCOUNTER — Ambulatory Visit: Payer: Medicare Other

## 2019-10-06 ENCOUNTER — Ambulatory Visit: Payer: Medicare Other | Attending: Internal Medicine

## 2019-10-06 DIAGNOSIS — Z23 Encounter for immunization: Secondary | ICD-10-CM

## 2019-10-06 NOTE — Progress Notes (Signed)
   Covid-19 Vaccination Clinic  Name:  Chloe Holmes    MRN: [id number] DOB: 1946/01/24  10/06/2019  [name] was observed post Covid-19 immunization for 15 minutes without incidence. She was provided with Vaccine Information Sheet and instruction to access the V-Safe system.   [name] was instructed to call 911 with any severe reactions post vaccine: [name] Difficulty breathing  . Swelling of your face and throat  . A fast heartbeat  . A bad rash all over your body  . Dizziness and weakness    Immunizations Administered    Name Date Dose VIS Date Route   Pfizer COVID-19 Vaccine 10/06/2019  8:38 AM 0.3 mL 08/05/2019 Intramuscular   Manufacturer: [place]   Lot: [id number]   [place]: [id number]

## 2019-11-07 DIAGNOSIS — D3131 Benign neoplasm of right choroid: Secondary | ICD-10-CM | POA: Diagnosis not present

## 2019-11-07 DIAGNOSIS — Z961 Presence of intraocular lens: Secondary | ICD-10-CM | POA: Diagnosis not present

## 2019-12-06 DIAGNOSIS — M1712 Unilateral primary osteoarthritis, left knee: Secondary | ICD-10-CM | POA: Diagnosis not present

## 2019-12-07 DIAGNOSIS — E7849 Other hyperlipidemia: Secondary | ICD-10-CM | POA: Diagnosis not present

## 2019-12-07 DIAGNOSIS — R739 Hyperglycemia, unspecified: Secondary | ICD-10-CM | POA: Diagnosis not present

## 2019-12-08 DIAGNOSIS — M1712 Unilateral primary osteoarthritis, left knee: Secondary | ICD-10-CM | POA: Diagnosis not present

## 2019-12-12 DIAGNOSIS — M1990 Unspecified osteoarthritis, unspecified site: Secondary | ICD-10-CM | POA: Diagnosis not present

## 2019-12-12 DIAGNOSIS — E785 Hyperlipidemia, unspecified: Secondary | ICD-10-CM | POA: Diagnosis not present

## 2019-12-12 DIAGNOSIS — R739 Hyperglycemia, unspecified: Secondary | ICD-10-CM | POA: Diagnosis not present

## 2019-12-12 DIAGNOSIS — Z0000 Encounter for general adult medical examination without abnormal findings: Secondary | ICD-10-CM | POA: Diagnosis not present

## 2019-12-12 DIAGNOSIS — I10 Essential (primary) hypertension: Secondary | ICD-10-CM | POA: Diagnosis not present

## 2019-12-12 DIAGNOSIS — E669 Obesity, unspecified: Secondary | ICD-10-CM | POA: Diagnosis not present

## 2019-12-12 DIAGNOSIS — R002 Palpitations: Secondary | ICD-10-CM | POA: Diagnosis not present

## 2019-12-12 DIAGNOSIS — Z1331 Encounter for screening for depression: Secondary | ICD-10-CM | POA: Diagnosis not present

## 2019-12-12 DIAGNOSIS — R82998 Other abnormal findings in urine: Secondary | ICD-10-CM | POA: Diagnosis not present

## 2019-12-12 DIAGNOSIS — L659 Nonscarring hair loss, unspecified: Secondary | ICD-10-CM | POA: Diagnosis not present

## 2019-12-12 DIAGNOSIS — I700 Atherosclerosis of aorta: Secondary | ICD-10-CM | POA: Diagnosis not present

## 2019-12-12 DIAGNOSIS — I493 Ventricular premature depolarization: Secondary | ICD-10-CM | POA: Diagnosis not present

## 2019-12-14 DIAGNOSIS — Z1212 Encounter for screening for malignant neoplasm of rectum: Secondary | ICD-10-CM | POA: Diagnosis not present

## 2020-04-19 DIAGNOSIS — Z23 Encounter for immunization: Secondary | ICD-10-CM | POA: Diagnosis not present

## 2020-07-12 DIAGNOSIS — L570 Actinic keratosis: Secondary | ICD-10-CM | POA: Diagnosis not present

## 2020-07-12 DIAGNOSIS — D1801 Hemangioma of skin and subcutaneous tissue: Secondary | ICD-10-CM | POA: Diagnosis not present

## 2020-07-12 DIAGNOSIS — L821 Other seborrheic keratosis: Secondary | ICD-10-CM | POA: Diagnosis not present

## 2020-07-12 DIAGNOSIS — D225 Melanocytic nevi of trunk: Secondary | ICD-10-CM | POA: Diagnosis not present

## 2020-07-12 DIAGNOSIS — L814 Other melanin hyperpigmentation: Secondary | ICD-10-CM | POA: Diagnosis not present

## 2020-07-12 DIAGNOSIS — D485 Neoplasm of uncertain behavior of skin: Secondary | ICD-10-CM | POA: Diagnosis not present

## 2020-07-12 DIAGNOSIS — D692 Other nonthrombocytopenic purpura: Secondary | ICD-10-CM | POA: Diagnosis not present

## 2020-07-12 DIAGNOSIS — D2271 Melanocytic nevi of right lower limb, including hip: Secondary | ICD-10-CM | POA: Diagnosis not present

## 2020-07-12 DIAGNOSIS — L650 Telogen effluvium: Secondary | ICD-10-CM | POA: Diagnosis not present

## 2020-07-12 DIAGNOSIS — L309 Dermatitis, unspecified: Secondary | ICD-10-CM | POA: Diagnosis not present

## 2020-07-12 DIAGNOSIS — L820 Inflamed seborrheic keratosis: Secondary | ICD-10-CM | POA: Diagnosis not present

## 2020-09-28 DIAGNOSIS — G4700 Insomnia, unspecified: Secondary | ICD-10-CM | POA: Diagnosis not present

## 2020-09-28 DIAGNOSIS — R739 Hyperglycemia, unspecified: Secondary | ICD-10-CM | POA: Diagnosis not present

## 2020-09-28 DIAGNOSIS — I7300 Raynaud's syndrome without gangrene: Secondary | ICD-10-CM | POA: Diagnosis not present

## 2020-09-28 DIAGNOSIS — E785 Hyperlipidemia, unspecified: Secondary | ICD-10-CM | POA: Diagnosis not present

## 2020-09-28 DIAGNOSIS — I77810 Thoracic aortic ectasia: Secondary | ICD-10-CM | POA: Diagnosis not present

## 2020-09-28 DIAGNOSIS — I493 Ventricular premature depolarization: Secondary | ICD-10-CM | POA: Diagnosis not present

## 2020-09-28 DIAGNOSIS — E669 Obesity, unspecified: Secondary | ICD-10-CM | POA: Diagnosis not present

## 2020-09-28 DIAGNOSIS — I700 Atherosclerosis of aorta: Secondary | ICD-10-CM | POA: Diagnosis not present

## 2020-09-28 DIAGNOSIS — I10 Essential (primary) hypertension: Secondary | ICD-10-CM | POA: Diagnosis not present

## 2020-09-28 DIAGNOSIS — D229 Melanocytic nevi, unspecified: Secondary | ICD-10-CM | POA: Diagnosis not present

## 2020-09-28 DIAGNOSIS — M1990 Unspecified osteoarthritis, unspecified site: Secondary | ICD-10-CM | POA: Diagnosis not present

## 2020-09-28 DIAGNOSIS — R002 Palpitations: Secondary | ICD-10-CM | POA: Diagnosis not present

## 2020-10-01 ENCOUNTER — Other Ambulatory Visit: Payer: Self-pay | Admitting: Internal Medicine

## 2020-10-01 DIAGNOSIS — Z1231 Encounter for screening mammogram for malignant neoplasm of breast: Secondary | ICD-10-CM

## 2020-10-12 ENCOUNTER — Other Ambulatory Visit: Payer: Self-pay | Admitting: Internal Medicine

## 2020-10-12 DIAGNOSIS — Z1231 Encounter for screening mammogram for malignant neoplasm of breast: Secondary | ICD-10-CM

## 2020-11-05 DIAGNOSIS — I10 Essential (primary) hypertension: Secondary | ICD-10-CM | POA: Diagnosis not present

## 2020-11-12 DIAGNOSIS — H52203 Unspecified astigmatism, bilateral: Secondary | ICD-10-CM | POA: Diagnosis not present

## 2020-11-12 DIAGNOSIS — Z961 Presence of intraocular lens: Secondary | ICD-10-CM | POA: Diagnosis not present

## 2020-11-12 DIAGNOSIS — H5203 Hypermetropia, bilateral: Secondary | ICD-10-CM | POA: Diagnosis not present

## 2020-11-12 DIAGNOSIS — H524 Presbyopia: Secondary | ICD-10-CM | POA: Diagnosis not present

## 2020-11-12 DIAGNOSIS — H26492 Other secondary cataract, left eye: Secondary | ICD-10-CM | POA: Diagnosis not present

## 2020-11-12 DIAGNOSIS — H40013 Open angle with borderline findings, low risk, bilateral: Secondary | ICD-10-CM | POA: Diagnosis not present

## 2020-11-16 DIAGNOSIS — Z1231 Encounter for screening mammogram for malignant neoplasm of breast: Secondary | ICD-10-CM

## 2020-11-19 ENCOUNTER — Ambulatory Visit: Payer: Medicare Other

## 2020-11-23 DIAGNOSIS — H40013 Open angle with borderline findings, low risk, bilateral: Secondary | ICD-10-CM | POA: Diagnosis not present

## 2020-11-28 DIAGNOSIS — Z01419 Encounter for gynecological examination (general) (routine) without abnormal findings: Secondary | ICD-10-CM | POA: Diagnosis not present

## 2020-11-28 DIAGNOSIS — Z7689 Persons encountering health services in other specified circumstances: Secondary | ICD-10-CM | POA: Diagnosis not present

## 2020-11-28 DIAGNOSIS — Z1231 Encounter for screening mammogram for malignant neoplasm of breast: Secondary | ICD-10-CM | POA: Diagnosis not present

## 2020-11-28 DIAGNOSIS — Z6831 Body mass index (BMI) 31.0-31.9, adult: Secondary | ICD-10-CM | POA: Diagnosis not present

## 2020-12-12 DIAGNOSIS — R739 Hyperglycemia, unspecified: Secondary | ICD-10-CM | POA: Diagnosis not present

## 2020-12-12 DIAGNOSIS — E785 Hyperlipidemia, unspecified: Secondary | ICD-10-CM | POA: Diagnosis not present

## 2020-12-12 DIAGNOSIS — I10 Essential (primary) hypertension: Secondary | ICD-10-CM | POA: Diagnosis not present

## 2020-12-17 DIAGNOSIS — I700 Atherosclerosis of aorta: Secondary | ICD-10-CM | POA: Diagnosis not present

## 2020-12-17 DIAGNOSIS — Z0000 Encounter for general adult medical examination without abnormal findings: Secondary | ICD-10-CM | POA: Diagnosis not present

## 2020-12-17 DIAGNOSIS — G4700 Insomnia, unspecified: Secondary | ICD-10-CM | POA: Diagnosis not present

## 2020-12-17 DIAGNOSIS — R739 Hyperglycemia, unspecified: Secondary | ICD-10-CM | POA: Diagnosis not present

## 2020-12-17 DIAGNOSIS — Z1339 Encounter for screening examination for other mental health and behavioral disorders: Secondary | ICD-10-CM | POA: Diagnosis not present

## 2020-12-17 DIAGNOSIS — Z1212 Encounter for screening for malignant neoplasm of rectum: Secondary | ICD-10-CM | POA: Diagnosis not present

## 2020-12-17 DIAGNOSIS — M1990 Unspecified osteoarthritis, unspecified site: Secondary | ICD-10-CM | POA: Diagnosis not present

## 2020-12-17 DIAGNOSIS — I10 Essential (primary) hypertension: Secondary | ICD-10-CM | POA: Diagnosis not present

## 2020-12-17 DIAGNOSIS — I77810 Thoracic aortic ectasia: Secondary | ICD-10-CM | POA: Diagnosis not present

## 2020-12-17 DIAGNOSIS — R82998 Other abnormal findings in urine: Secondary | ICD-10-CM | POA: Diagnosis not present

## 2020-12-17 DIAGNOSIS — Z1331 Encounter for screening for depression: Secondary | ICD-10-CM | POA: Diagnosis not present

## 2020-12-17 DIAGNOSIS — R002 Palpitations: Secondary | ICD-10-CM | POA: Diagnosis not present

## 2020-12-17 DIAGNOSIS — I493 Ventricular premature depolarization: Secondary | ICD-10-CM | POA: Diagnosis not present

## 2020-12-17 DIAGNOSIS — E785 Hyperlipidemia, unspecified: Secondary | ICD-10-CM | POA: Diagnosis not present

## 2020-12-17 DIAGNOSIS — K5900 Constipation, unspecified: Secondary | ICD-10-CM | POA: Diagnosis not present

## 2020-12-17 DIAGNOSIS — E669 Obesity, unspecified: Secondary | ICD-10-CM | POA: Diagnosis not present

## 2021-01-28 DIAGNOSIS — R002 Palpitations: Secondary | ICD-10-CM | POA: Diagnosis not present

## 2021-01-28 DIAGNOSIS — I10 Essential (primary) hypertension: Secondary | ICD-10-CM | POA: Diagnosis not present

## 2021-01-28 DIAGNOSIS — I700 Atherosclerosis of aorta: Secondary | ICD-10-CM | POA: Diagnosis not present

## 2021-01-28 DIAGNOSIS — I77810 Thoracic aortic ectasia: Secondary | ICD-10-CM | POA: Diagnosis not present

## 2021-03-02 DIAGNOSIS — Z23 Encounter for immunization: Secondary | ICD-10-CM | POA: Diagnosis not present

## 2021-07-25 DIAGNOSIS — Z23 Encounter for immunization: Secondary | ICD-10-CM | POA: Diagnosis not present

## 2021-07-25 DIAGNOSIS — D225 Melanocytic nevi of trunk: Secondary | ICD-10-CM | POA: Diagnosis not present

## 2021-07-25 DIAGNOSIS — I77810 Thoracic aortic ectasia: Secondary | ICD-10-CM | POA: Diagnosis not present

## 2021-07-25 DIAGNOSIS — L814 Other melanin hyperpigmentation: Secondary | ICD-10-CM | POA: Diagnosis not present

## 2021-07-25 DIAGNOSIS — I10 Essential (primary) hypertension: Secondary | ICD-10-CM | POA: Diagnosis not present

## 2021-07-25 DIAGNOSIS — L821 Other seborrheic keratosis: Secondary | ICD-10-CM | POA: Diagnosis not present

## 2021-07-25 DIAGNOSIS — L570 Actinic keratosis: Secondary | ICD-10-CM | POA: Diagnosis not present

## 2021-07-25 DIAGNOSIS — I700 Atherosclerosis of aorta: Secondary | ICD-10-CM | POA: Diagnosis not present

## 2021-07-25 DIAGNOSIS — R002 Palpitations: Secondary | ICD-10-CM | POA: Diagnosis not present

## 2021-08-12 DIAGNOSIS — Z23 Encounter for immunization: Secondary | ICD-10-CM | POA: Diagnosis not present

## 2021-09-30 DIAGNOSIS — M19072 Primary osteoarthritis, left ankle and foot: Secondary | ICD-10-CM | POA: Diagnosis not present

## 2021-09-30 DIAGNOSIS — M25572 Pain in left ankle and joints of left foot: Secondary | ICD-10-CM | POA: Diagnosis not present

## 2021-09-30 DIAGNOSIS — M79671 Pain in right foot: Secondary | ICD-10-CM | POA: Diagnosis not present

## 2021-09-30 DIAGNOSIS — M25571 Pain in right ankle and joints of right foot: Secondary | ICD-10-CM | POA: Diagnosis not present

## 2021-09-30 DIAGNOSIS — M79672 Pain in left foot: Secondary | ICD-10-CM | POA: Diagnosis not present

## 2021-11-13 DIAGNOSIS — H5203 Hypermetropia, bilateral: Secondary | ICD-10-CM | POA: Diagnosis not present

## 2021-11-13 DIAGNOSIS — H524 Presbyopia: Secondary | ICD-10-CM | POA: Diagnosis not present

## 2021-11-13 DIAGNOSIS — H52203 Unspecified astigmatism, bilateral: Secondary | ICD-10-CM | POA: Diagnosis not present

## 2021-11-13 DIAGNOSIS — Z961 Presence of intraocular lens: Secondary | ICD-10-CM | POA: Diagnosis not present

## 2021-11-13 DIAGNOSIS — H40013 Open angle with borderline findings, low risk, bilateral: Secondary | ICD-10-CM | POA: Diagnosis not present

## 2021-11-13 DIAGNOSIS — H26492 Other secondary cataract, left eye: Secondary | ICD-10-CM | POA: Diagnosis not present

## 2021-12-04 ENCOUNTER — Other Ambulatory Visit: Payer: Self-pay | Admitting: Internal Medicine

## 2021-12-09 ENCOUNTER — Other Ambulatory Visit: Payer: Self-pay | Admitting: Internal Medicine

## 2021-12-09 DIAGNOSIS — Z1231 Encounter for screening mammogram for malignant neoplasm of breast: Secondary | ICD-10-CM

## 2021-12-10 ENCOUNTER — Ambulatory Visit
Admission: RE | Admit: 2021-12-10 | Discharge: 2021-12-10 | Disposition: A | Discharge status: Home / Self Care | Payer: Medicare Other | Source: Ambulatory Visit | Attending: Internal Medicine | Admitting: Internal Medicine

## 2021-12-10 DIAGNOSIS — Z1231 Encounter for screening mammogram for malignant neoplasm of breast: Secondary | ICD-10-CM | POA: Diagnosis not present

## 2021-12-17 DIAGNOSIS — R739 Hyperglycemia, unspecified: Secondary | ICD-10-CM | POA: Diagnosis not present

## 2021-12-17 DIAGNOSIS — I10 Essential (primary) hypertension: Secondary | ICD-10-CM | POA: Diagnosis not present

## 2021-12-17 DIAGNOSIS — E785 Hyperlipidemia, unspecified: Secondary | ICD-10-CM | POA: Diagnosis not present

## 2021-12-23 DIAGNOSIS — I10 Essential (primary) hypertension: Secondary | ICD-10-CM | POA: Diagnosis not present

## 2021-12-23 DIAGNOSIS — R002 Palpitations: Secondary | ICD-10-CM | POA: Diagnosis not present

## 2021-12-23 DIAGNOSIS — Z1389 Encounter for screening for other disorder: Secondary | ICD-10-CM | POA: Diagnosis not present

## 2021-12-23 DIAGNOSIS — Z1331 Encounter for screening for depression: Secondary | ICD-10-CM | POA: Diagnosis not present

## 2021-12-23 DIAGNOSIS — I700 Atherosclerosis of aorta: Secondary | ICD-10-CM | POA: Diagnosis not present

## 2021-12-23 DIAGNOSIS — Z0000 Encounter for general adult medical examination without abnormal findings: Secondary | ICD-10-CM | POA: Diagnosis not present

## 2021-12-23 DIAGNOSIS — E669 Obesity, unspecified: Secondary | ICD-10-CM | POA: Diagnosis not present

## 2021-12-23 DIAGNOSIS — M1990 Unspecified osteoarthritis, unspecified site: Secondary | ICD-10-CM | POA: Diagnosis not present

## 2021-12-23 DIAGNOSIS — R739 Hyperglycemia, unspecified: Secondary | ICD-10-CM | POA: Diagnosis not present

## 2021-12-23 DIAGNOSIS — I77810 Thoracic aortic ectasia: Secondary | ICD-10-CM | POA: Diagnosis not present

## 2021-12-23 DIAGNOSIS — Z1212 Encounter for screening for malignant neoplasm of rectum: Secondary | ICD-10-CM | POA: Diagnosis not present

## 2021-12-23 DIAGNOSIS — G4700 Insomnia, unspecified: Secondary | ICD-10-CM | POA: Diagnosis not present

## 2021-12-23 DIAGNOSIS — R1310 Dysphagia, unspecified: Secondary | ICD-10-CM | POA: Diagnosis not present

## 2021-12-23 DIAGNOSIS — R82998 Other abnormal findings in urine: Secondary | ICD-10-CM | POA: Diagnosis not present

## 2021-12-23 DIAGNOSIS — K5900 Constipation, unspecified: Secondary | ICD-10-CM | POA: Diagnosis not present

## 2021-12-23 DIAGNOSIS — E785 Hyperlipidemia, unspecified: Secondary | ICD-10-CM | POA: Diagnosis not present

## 2022-01-06 DIAGNOSIS — M1712 Unilateral primary osteoarthritis, left knee: Secondary | ICD-10-CM | POA: Diagnosis not present

## 2022-01-08 DIAGNOSIS — M1712 Unilateral primary osteoarthritis, left knee: Secondary | ICD-10-CM | POA: Diagnosis not present

## 2022-01-16 DIAGNOSIS — M1712 Unilateral primary osteoarthritis, left knee: Secondary | ICD-10-CM | POA: Diagnosis not present

## 2022-01-21 DIAGNOSIS — E785 Hyperlipidemia, unspecified: Secondary | ICD-10-CM | POA: Diagnosis not present

## 2022-01-21 DIAGNOSIS — I10 Essential (primary) hypertension: Secondary | ICD-10-CM | POA: Diagnosis not present

## 2022-01-21 DIAGNOSIS — M1712 Unilateral primary osteoarthritis, left knee: Secondary | ICD-10-CM | POA: Diagnosis not present

## 2022-01-22 ENCOUNTER — Other Ambulatory Visit (HOSPITAL_COMMUNITY): Payer: Self-pay | Admitting: Internal Medicine

## 2022-01-22 ENCOUNTER — Other Ambulatory Visit: Payer: Self-pay | Admitting: Internal Medicine

## 2022-01-23 DIAGNOSIS — M1712 Unilateral primary osteoarthritis, left knee: Secondary | ICD-10-CM | POA: Diagnosis not present

## 2022-01-28 ENCOUNTER — Ambulatory Visit (HOSPITAL_COMMUNITY)
Admission: RE | Admit: 2022-01-28 | Discharge: 2022-01-28 | Disposition: A | Discharge status: Home / Self Care | Payer: Medicare Other | Source: Ambulatory Visit | Attending: Internal Medicine | Admitting: Internal Medicine

## 2022-01-28 ENCOUNTER — Other Ambulatory Visit (HOSPITAL_COMMUNITY): Payer: Self-pay | Admitting: Internal Medicine

## 2022-01-28 DIAGNOSIS — I10 Essential (primary) hypertension: Secondary | ICD-10-CM | POA: Diagnosis not present

## 2022-01-28 DIAGNOSIS — M1712 Unilateral primary osteoarthritis, left knee: Secondary | ICD-10-CM | POA: Diagnosis not present

## 2022-01-30 DIAGNOSIS — M1712 Unilateral primary osteoarthritis, left knee: Secondary | ICD-10-CM | POA: Diagnosis not present

## 2022-05-14 ENCOUNTER — Other Ambulatory Visit (HOSPITAL_COMMUNITY): Payer: Self-pay | Admitting: Internal Medicine

## 2022-05-14 DIAGNOSIS — I10 Essential (primary) hypertension: Secondary | ICD-10-CM

## 2022-05-14 DIAGNOSIS — R002 Palpitations: Secondary | ICD-10-CM

## 2022-05-15 ENCOUNTER — Ambulatory Visit (HOSPITAL_COMMUNITY): Payer: Medicare Other | Attending: Internal Medicine

## 2022-05-15 DIAGNOSIS — I10 Essential (primary) hypertension: Secondary | ICD-10-CM | POA: Insufficient documentation

## 2022-05-15 DIAGNOSIS — R002 Palpitations: Secondary | ICD-10-CM | POA: Diagnosis not present

## 2022-05-15 LAB — ECHOCARDIOGRAM COMPLETE
Area-P 1/2: 2.22 cm2
S' Lateral: 2.9 cm

## 2022-05-22 DIAGNOSIS — I77810 Thoracic aortic ectasia: Secondary | ICD-10-CM | POA: Diagnosis not present

## 2022-05-22 DIAGNOSIS — Z23 Encounter for immunization: Secondary | ICD-10-CM | POA: Diagnosis not present

## 2022-05-22 DIAGNOSIS — I700 Atherosclerosis of aorta: Secondary | ICD-10-CM | POA: Diagnosis not present

## 2022-05-22 DIAGNOSIS — I10 Essential (primary) hypertension: Secondary | ICD-10-CM | POA: Diagnosis not present

## 2022-05-22 DIAGNOSIS — E669 Obesity, unspecified: Secondary | ICD-10-CM | POA: Diagnosis not present

## 2022-07-01 DIAGNOSIS — I77810 Thoracic aortic ectasia: Secondary | ICD-10-CM | POA: Diagnosis not present

## 2022-07-01 DIAGNOSIS — I493 Ventricular premature depolarization: Secondary | ICD-10-CM | POA: Diagnosis not present

## 2022-07-01 DIAGNOSIS — D229 Melanocytic nevi, unspecified: Secondary | ICD-10-CM | POA: Diagnosis not present

## 2022-07-01 DIAGNOSIS — E785 Hyperlipidemia, unspecified: Secondary | ICD-10-CM | POA: Diagnosis not present

## 2022-07-01 DIAGNOSIS — R739 Hyperglycemia, unspecified: Secondary | ICD-10-CM | POA: Diagnosis not present

## 2022-07-01 DIAGNOSIS — R1310 Dysphagia, unspecified: Secondary | ICD-10-CM | POA: Diagnosis not present

## 2022-07-01 DIAGNOSIS — R002 Palpitations: Secondary | ICD-10-CM | POA: Diagnosis not present

## 2022-07-01 DIAGNOSIS — I10 Essential (primary) hypertension: Secondary | ICD-10-CM | POA: Diagnosis not present

## 2022-07-01 DIAGNOSIS — I700 Atherosclerosis of aorta: Secondary | ICD-10-CM | POA: Diagnosis not present

## 2022-07-01 DIAGNOSIS — E669 Obesity, unspecified: Secondary | ICD-10-CM | POA: Diagnosis not present

## 2022-07-25 DIAGNOSIS — K130 Diseases of lips: Secondary | ICD-10-CM | POA: Diagnosis not present

## 2022-07-25 DIAGNOSIS — L570 Actinic keratosis: Secondary | ICD-10-CM | POA: Diagnosis not present

## 2022-07-25 DIAGNOSIS — L814 Other melanin hyperpigmentation: Secondary | ICD-10-CM | POA: Diagnosis not present

## 2022-07-25 DIAGNOSIS — L821 Other seborrheic keratosis: Secondary | ICD-10-CM | POA: Diagnosis not present

## 2022-08-12 DIAGNOSIS — M19071 Primary osteoarthritis, right ankle and foot: Secondary | ICD-10-CM | POA: Diagnosis not present

## 2022-09-17 DIAGNOSIS — H40013 Open angle with borderline findings, low risk, bilateral: Secondary | ICD-10-CM | POA: Diagnosis not present

## 2022-09-23 ENCOUNTER — Ambulatory Visit (INDEPENDENT_AMBULATORY_CARE_PROVIDER_SITE_OTHER): Payer: Medicare Other | Admitting: Nurse Practitioner

## 2022-09-23 ENCOUNTER — Encounter: Payer: Self-pay | Admitting: Nurse Practitioner

## 2022-09-23 VITALS — BP 150/86 | HR 63 | Ht 64.0 in | Wt 201.2 lb

## 2022-09-23 DIAGNOSIS — R1310 Dysphagia, unspecified: Secondary | ICD-10-CM

## 2022-09-23 DIAGNOSIS — K5900 Constipation, unspecified: Secondary | ICD-10-CM

## 2022-09-23 DIAGNOSIS — Z800 Family history of malignant neoplasm of digestive organs: Secondary | ICD-10-CM | POA: Diagnosis not present

## 2022-09-23 MED ORDER — NA SULFATE-K SULFATE-MG SULF 17.5-3.13-1.6 GM/177ML PO SOLN: 1.0000 | Freq: Once | ORAL | 0 refills | Status: AC

## 2022-09-23 NOTE — Progress Notes (Signed)
Addendum: Reviewed and agree with assessment and management plan. Nissi Doffing M, MD  

## 2022-09-23 NOTE — Progress Notes (Signed)
09/23/2022 Chloe Holmes [id number] 06-06-1946   CHIEF COMPLAINT: Dysphagia, constipation   HISTORY OF PRESENT ILLNESS: Chloe Holmes is a 77 year old female with a past medical history of arthritis, hypertension, hyperlipidemia, diverticulosis, chronic constipation, hiatal hernia and GERD. S/P left knee total replacement surgery 2020. She is known by Dr. Hilarie Fredrickson.  She presents today self-referred for further evaluation regarding dysphagia and constipation.  She described feeling choked on New Year's Day 2024. She described eating pork while she was drinking wine and talking then acutely felt choked but could speak and breathe. The pork felt stuck in her upper esophagus, she felt as if she was having esophageal spasms. She had difficulty swallowing saliva.  Part of the stuck pork was expelled and the remaining portion of the pork passed down the esophagus 1 1/2 hours later.  She has noted mild voice hoarseness since then.  No heartburn.  No further episodes of dysphagia since New Year's Day, however, she feels food (ie apples) is slower to pass down the esophagus.  No abdominal pain.  She underwent an EGD 09/19/2013 by Dr. Sharlett Iles which identified a 2 cm hiatal hernia, she demonstrated active acid reflux throughout this procedure with severe coughing and mild antral gastropathy was noted.  Pylori stool antigen 09/19/2013 was negative.  EGD 04/04/2009 showed moderate gastritis without evidence of H. pylori.  She was seen by Dr. Hilarie Fredrickson in 2015 due to dysphagia symptoms after she underwent esophageal manometry which showed nonspecific esophageal dysmotility with an elevated residual LES pressure was nondiagnostic for achalasia.  Dilation of the LES and Botox injections were considered but her swallowing symptoms improved and she did not wish to pursue any further therapy at that time.  She took [place] for about 1 month which she discontinued because it resulted in nausea.  She is also concerned  regarding chronic constipation which has worsened.  She took MiraLAX in the past which controlled her MiraLAX quite well but resulted in hypertension and swelling to her ankles.  He stopped taking MiraLAX 1 year ago and her blood pressure significantly improved and her lower extremity edema abated. She takes milk of magnesia, Colace stool softener or Colace stool softener/laxative as needed.  She previously drank prune juice and ate dried prunes to treat her constipation but she was concerned regarding the additional caloric intake so she stopped consuming prune products.  She uses a Fleet suppository once every week or two and a fleet enema once very 2 to 3 months. She took Linzess x 10 days as prescribed by her PCP which she discontinued after she went 4 days without passing a bowel movement and experienced abdominal pain. She purchased a fruit and fiber supplement from [place] which she intends to take on a daily basis.  She underwent a colonoscopy 12/21/2000, 04/04/2009 and 09/19/2013, no polyps identified.  Following her third colonoscopy 08/2013, she was advised to repeat a colonoscopy in 10 years.  Mother diagnosed with colon cancer at the age of 82.  She wishes to schedule a colonoscopy at this time primarily due to concerns of worsening constipation.  No rectal bleeding.  Labs 12/23/2021: Hemosure negative Labs 01/22/2022: Glucose 91.  BUN 14.  Creatinine 0.8.  Sodium 138.  Potassium 4.7.  Calcium 9.3.  Labs 12/18/2021: Hemoglobin A1c 5.1%.  WBC 4.86.  Hemoglobin 13.4.  Hematocrit 39.0.  Platelet 186.  TSH 1.49.  ECHO 05/15/2022: LV EF 55%  MOST RECENT GI PROCEDURES:  EGD 09/19/2013 The duodenal mucosa showed  no abnormalities in the bulb and second portion of the duodenum The mucosa in the esophagus appeared normal 2 cm hiatal hernia was noticed and the patient seemed to have rather free acid reflux throughout the procedure with severe coughing after the procedure.  However, there is no evidence of a  larger hiatal hernia or erosive esophagitis.  Her picture is most consistent with chronic GERD and reactive airway disease. There was mild antral gastropathy noted. Rule out H. 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   GERD (gastroesophageal reflux disease)    History of hiatal hernia    HLD (hyperlipidemia)    Hypertension    Past Surgical History:  Procedure Laterality Date   ABDOMINAL HYSTERECTOMY     CERVICAL DISC SURGERY     COLONOSCOPY  2016   DILATION AND CURETTAGE OF UTERUS     ESOPHAGEAL MANOMETRY N/A 10/03/2013   Procedure: ESOPHAGEAL MANOMETRY (EM);  Surgeon: Jerene Bears, MD;  Location: [facility];  Service: Gastroenterology;  Laterality: N/A;   EYE SURGERY     bilateral cataracts    ORBITAL FRACTURE SURGERY     TOTAL KNEE ARTHROPLASTY Left 01/27/2019   Procedure: TOTAL KNEE ARTHROPLASTY;  Surgeon: Paralee Cancel, MD;  Location: [facility];  Service: Orthopedics;  Laterality: Left;  70 mins   Social History: She is divorced.  She is a retired Warden/ranger.  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  Non-smoker.  Infrequent alcohol use.  No drug use.  Family History: Mother diagnosed with colon cancer at the age of 34.  Father with history of hypertension and lung cancer.  Paternal grandfather with history of stomach cancer.  Allergies  Allergen Reactions   Morphine Other (See Comments)    Hypotension; had to be narcan'd . This was 40 years ago       Outpatient Encounter Medications as of 09/23/2022   Medication Sig   ALPRAZolam (XANAX) 0.25 MG tablet Take 0.25 mg by mouth at bedtime as needed for anxiety.   atorvastatin (LIPITOR) 20 MG tablet Take 20 mg by mouth 3 (three) times a week.   docusate sodium (COLACE) 100 MG capsule Take 100 mg by mouth daily.   hydrochlorothiazide (HYDRODIURIL) 25 MG tablet Take 25 mg by mouth daily.   Na Sulfate-K Sulfate-Mg Sulf 17.5-3.13-1.6 GM/177ML SOLN Take 1 kit by mouth once for 1 dose.   nebivolol (BYSTOLIC) 10 MG tablet Take 10 mg by mouth daily.   [DISCONTINUED] atorvastatin (LIPITOR) 10 MG tablet Take 10 mg by mouth every Monday, Wednesday, and Friday at 8 PM. AT NIGHT   [DISCONTINUED] docusate sodium (COLACE) 100 MG capsule Take 1 capsule (100 mg total) by mouth 2 (two) times daily.   [DISCONTINUED] esomeprazole (NEXIUM) 40 MG capsule Take 40 mg by mouth daily before breakfast.   [DISCONTINUED] ferrous sulfate (FERROUSUL) 325 (65 FE) MG tablet Take 1 tablet (325 mg total) by mouth 3 (three) times daily with meals.   [DISCONTINUED] HYDROcodone-acetaminophen ([place]) 7.5-325  MG tablet Take 1-2 tablets by mouth every 4 (four) hours as needed for moderate pain.   [DISCONTINUED] methocarbamol (ROBAXIN) 500 MG tablet Take 1 tablet (500 mg total) by mouth every 6 (six) hours as needed for muscle spasms.   [DISCONTINUED] Nebivolol HCl (BYSTOLIC) 20 MG TABS Take 20 mg by mouth daily.    [DISCONTINUED] omeprazole (PRILOSEC OTC) 20 MG tablet Take 20 mg by mouth at bedtime.   [DISCONTINUED] polyethylene glycol (MIRALAX / GLYCOLAX) 17 g packet Take 17 g by mouth 2 (two) times daily.   [DISCONTINUED] TURMERIC PO Take 1 capsule by mouth daily with lunch.    No facility-administered encounter medications on file as of 09/23/2022.    REVIEW OF SYSTEMS:  Gen: Denies fever, sweats or chills. No weight loss.  CV: Denies chest pain, palpitations or edema. Resp: Denies cough, shortness of breath of hemoptysis.  GI: See HPI.  GU : Denies urinary burning, blood in  urine, increased urinary frequency or incontinence. MS: + Arthritis and back pain. Derm: Denies rash, itchiness, skin lesions or unhealing ulcers. Psych: Denies depression, anxiety, memory loss or confusion. Heme: Denies bruising, easy bleeding. Neuro:  Denies headaches, dizziness or paresthesias. Endo:  Denies any problems with DM, thyroid or adrenal function.  PHYSICAL EXAM: BP (!) 150/86   Pulse 63   Ht '5\' 4"'$  (1.626 m)   Wt 201 lb 4 oz (91.3 kg)   SpO2 96%   BMI 34.54 kg/m   General: 77 year old female in no acute distress. Head: Normocephalic and atraumatic. Eyes:  Sclerae non-icteric, conjunctive pink. Ears: Normal auditory acuity. Mouth: Dentition intact. No ulcers or lesions.  Neck: Supple, no lymphadenopathy or thyromegaly.  Lungs: Clear bilaterally to auscultation without wheezes, crackles or rhonchi. Heart: Regular rate and rhythm. No murmur, rub or gallop appreciated.  Abdomen: Soft, nontender, nondistended. No masses. No hepatosplenomegaly. Normoactive bowel sounds x 4 quadrants.  Rectal: Deferred. Musculoskeletal: Symmetrical with no gross deformities. Skin: Warm and dry. No rash or lesions on visible extremities. Extremities: No edema. Neurological: Alert oriented x 4, no focal deficits.  Psychological:  Alert and cooperative. Normal mood and affect.  ASSESSMENT AND PLAN:  62) 77 year old female with a history of GERD and mild esophageal dysmotility with recurrent dysphagia. S/P EGD 09/19/2013 identified a 2 cm hiatal hernia and GERD. Esophageal manometry in 2015 showed nonspecific esophageal dysmotility with an elevated residual LES pressure, dilation of the LES and Botox injections were considered but her swallowing symptoms improved.  -EGD with possible esophageal dilatation benefits and risks discussed including risk with sedation, risk of bleeding, perforation and infection  -Patient instructed to avoid large pieces of meat, chicken/bread and rice, to cut food  into small pieces and chew food thoroughly -Patient to contact office if symptoms worsen  2) Chronic constipation which has worsened over the past year after she discontinued MiraLAX.  Hypertension and lower extremity edema significantly proved after she discontinued MiraLAX. -Benefiber 1 tablespoon daily as tolerated -Milk of magnesia every third night as needed -Colonoscopy benefits and risks discussed including risk with sedation, risk of bleeding, perforation and infection   3) Colon cancer screening. Colonoscopy 2002, 2010 and 2015 without colon polyps. Mother with history of colon cancer diagnosed at the age of 45.  -Next Screening colonoscopy due 08/2023, however, patient wishes to proceed with a diagnostic colonoscopy due to worsening constipation as noted above          CC:  Shon Baton, MD

## 2022-09-23 NOTE — Patient Instructions (Addendum)
You have been scheduled for an endoscopy and colonoscopy. Please follow the written instructions given to you at your visit today. Please pick up your prep supplies at the pharmacy within the next 1-3 days. If you use inhalers (even only as needed), please bring them with you on the day of your procedure.  Benefiber one tablespoon daily as tolerated   Milk of Magnesia 23m every 3rd night as needed  Take Milk of Magnesia 334mfor 2 nights prior to colonoscopy prep date   Avoid eating large pieces of steak/chicken/bread or rice   Cut your food into small pieces and chew food thoroughly.  Due to recent changes in healthcare laws, you may see the results of your imaging and laboratory studies on MyChart before your provider has had a chance to review them.  We understand that in some cases there may be results that are confusing or concerning to you. Not all laboratory results come back in the same time frame and the provider may be waiting for multiple results in order to interpret others.  Please give [email or web address] hours in order for your provider to thoroughly review all the results before contacting the office for clarification of your results.   Thank you for trusting me with your gastrointestinal care!   CoCarl BestCRNP

## 2022-09-25 DIAGNOSIS — Z23 Encounter for immunization: Secondary | ICD-10-CM | POA: Diagnosis not present

## 2022-10-30 ENCOUNTER — Encounter: Payer: Self-pay | Admitting: Internal Medicine

## 2022-10-30 ENCOUNTER — Ambulatory Visit (AMBULATORY_SURGERY_CENTER): Payer: Medicare Other | Admitting: Internal Medicine

## 2022-10-30 VITALS — BP 128/74 | HR 63 | Temp 98.0°F | Resp 10 | Ht 64.0 in | Wt 201.0 lb

## 2022-10-30 DIAGNOSIS — Z1211 Encounter for screening for malignant neoplasm of colon: Secondary | ICD-10-CM

## 2022-10-30 DIAGNOSIS — R1310 Dysphagia, unspecified: Secondary | ICD-10-CM

## 2022-10-30 DIAGNOSIS — K5900 Constipation, unspecified: Secondary | ICD-10-CM

## 2022-10-30 DIAGNOSIS — K222 Esophageal obstruction: Secondary | ICD-10-CM | POA: Diagnosis not present

## 2022-10-30 DIAGNOSIS — D122 Benign neoplasm of ascending colon: Secondary | ICD-10-CM | POA: Diagnosis not present

## 2022-10-30 DIAGNOSIS — Z800 Family history of malignant neoplasm of digestive organs: Secondary | ICD-10-CM

## 2022-10-30 DIAGNOSIS — K635 Polyp of colon: Secondary | ICD-10-CM | POA: Diagnosis not present

## 2022-10-30 DIAGNOSIS — I10 Essential (primary) hypertension: Secondary | ICD-10-CM | POA: Diagnosis not present

## 2022-10-30 MED ORDER — SODIUM CHLORIDE 0.9 % IV SOLN: 500.0000 mL | Freq: Once | INTRAVENOUS | Status: DC

## 2022-10-30 MED ORDER — LUBIPROSTONE 24 MCG PO CAPS: 24.0000 ug | ORAL_CAPSULE | Freq: Two times a day (BID) | ORAL | 2 refills | Status: DC

## 2022-10-30 NOTE — Progress Notes (Signed)
Report to PACU, RN, vss, BBS= Clear.  

## 2022-10-30 NOTE — Progress Notes (Signed)
Pt's states no medical or surgical changes since previsit or office visit. 

## 2022-10-30 NOTE — Patient Instructions (Addendum)
Handouts provided on hiatal hernia, post-dilation diet, polyps, diverticulosis and hemorrhoids.   Post-dilation diet protocol and then resume previous diet as tolerated tomorrow.  Continue present medications.  Repeat upper endoscopy as needed for re-treatment.  Await pathology results.  No recommendation at this time regarding repeat colonoscopy due to age at next screening/surveillance interval (5 years).   Start Amitiza 24 mcg twice daily. Send me a MyChart message after about a week to let me know how it's working for you. Also let me know how your swallowing is doing.   YOU HAD AN ENDOSCOPIC PROCEDURE TODAY AT [facility]:   Refer to the procedure report that was given to you for any specific questions about what was found during the examination.  If the procedure report does not answer your questions, please call your gastroenterologist to clarify.  If you requested that your care partner not be given the details of your procedure findings, then the procedure report has been included in a sealed envelope for you to review at your convenience later.  YOU SHOULD EXPECT: Some feelings of bloating in the abdomen. Passage of more gas than usual.  Walking can help get rid of the air that was put into your GI tract during the procedure and reduce the bloating. If you had a lower endoscopy (such as a colonoscopy or flexible sigmoidoscopy) you may notice spotting of blood in your stool or on the toilet paper. If you underwent a bowel prep for your procedure, you may not have a normal bowel movement for a few days.  Please Note:  You might notice some irritation and congestion in your nose or some drainage.  This is from the oxygen used during your procedure.  There is no need for concern and it should clear up in a day or so.  SYMPTOMS TO REPORT IMMEDIATELY:  Following lower endoscopy (colonoscopy or flexible sigmoidoscopy):  Excessive amounts of blood in the stool  Significant  tenderness or worsening of abdominal pains  Swelling of the abdomen that is new, acute  Fever of 100F or higher  Following upper endoscopy (EGD)  Vomiting of blood or coffee ground material  New chest pain or pain under the shoulder blades  Painful or persistently difficult swallowing  New shortness of breath  Fever of 100F or higher  Black, tarry-looking stools  For urgent or emergent issues, a gastroenterologist can be reached at any hour by calling [phone]. Do not use MyChart messaging for urgent concerns.    DIET:  Clear liquids for 1 hour (until 4pm). Then a Soft diet (see handout) for the rest of today. You can resume your regular diet tomorrow as tolerated. Drink plenty of fluids but you should avoid alcoholic beverages for 24 hours.  ACTIVITY:  You should plan to take it easy for the rest of today and you should NOT DRIVE or use heavy machinery until tomorrow (because of the sedation medicines used during the test).    FOLLOW UP: Our staff will call the number listed on your records the next business day following your procedure.  We will call around 7:15- 8:00 am to check on you and address any questions or concerns that you may have regarding the information given to you following your procedure. If we do not reach you, we will leave a message.     If any biopsies were taken you will be contacted by phone or by letter within the next 1-3 weeks.  Please call us at [phone] if you have not heard about the biopsies in 3 weeks.    SIGNATURES/CONFIDENTIALITY: You and/or your care partner have signed paperwork which will be entered into your electronic medical record.  These signatures attest to the fact that that the information above on your After Visit Summary has been reviewed and is understood.  Full responsibility of the confidentiality of this discharge information lies with you and/or your care-partner.

## 2022-10-30 NOTE — Progress Notes (Signed)
Called to room to assist during endoscopic procedure.  Patient ID and intended procedure confirmed with present staff. Received instructions for my participation in the procedure from the performing physician.  

## 2022-10-30 NOTE — Progress Notes (Signed)
GASTROENTEROLOGY PROCEDURE H&P NOTE   Primary Care Physician: Shon Baton, MD    Reason for Procedure:   GERD/dysphagia, family history of colon cancer  Plan:    EGD and colonoscopy  Patient is appropriate for endoscopic procedure(s) in the ambulatory ([facility]) setting.  The nature of the procedure, as well as the risks, benefits, and alternatives were carefully and thoroughly reviewed with the patient. Ample time for discussion and questions allowed. The patient understood, was satisfied, and agreed to proceed.     HPI: Chloe Holmes is a 77 y.o. female who presents for EGD and colonoscopy.  Medical history as below.  Tolerated the prep.  No recent chest pain or shortness of breath.  No abdominal pain today.  Past Medical History:  Diagnosis Date   Arthritis    Diverticulosis    Dyspnea    At pre-op , pt  (a retired Warden/ranger) reports past Hx of SOB that she found out was being caused by frequent PVCs, she believes the PVCs were related to her poor tolerance of daily statin medication regimen ; however reports resolution of SOB now that she is only taking statin three times a week and taking bystolic to manage dysrhythmia.   GERD (gastroesophageal reflux disease)    History of hiatal hernia    HLD (hyperlipidemia)    Hypertension     Past Surgical History:  Procedure Laterality Date   ABDOMINAL HYSTERECTOMY     CERVICAL DISC SURGERY     COLONOSCOPY  2016   DILATION AND CURETTAGE OF UTERUS     ESOPHAGEAL MANOMETRY N/A 10/03/2013   Procedure: ESOPHAGEAL MANOMETRY (EM);  Surgeon: Jerene Bears, MD;  Location: [facility];  Service: Gastroenterology;  Laterality: N/A;   EYE SURGERY     bilateral cataracts    ORBITAL FRACTURE SURGERY     TOTAL KNEE ARTHROPLASTY Left 01/27/2019   Procedure: TOTAL KNEE ARTHROPLASTY;  Surgeon: Paralee Cancel, MD;  Location: [facility];  Service: Orthopedics;  Laterality: Left;  70 mins    Prior to Admission medications   Medication Sig Start  Date End Date Taking? Authorizing Provider  ALPRAZolam Duanne Moron) 0.25 MG tablet Take 0.25 mg by mouth at bedtime as needed for anxiety.   Yes [provider]  atorvastatin (LIPITOR) 20 MG tablet Take 20 mg by mouth 3 (three) times a week.   Yes [provider]  diclofenac (CATAFLAM) 50 MG tablet Take 50 mg by mouth 2 (two) times daily. 09/19/11  Yes [provider]  hydrochlorothiazide (HYDRODIURIL) 25 MG tablet Take 25 mg by mouth daily.   Yes [provider]  nebivolol (BYSTOLIC) 10 MG tablet Take 10 mg by mouth daily.   Yes [provider]  docusate sodium (COLACE) 100 MG capsule Take 100 mg by mouth daily. Patient not taking: Reported on 10/30/2022    [provider]  traZODone (DESYREL) 50 MG tablet Take 50 mg by mouth at bedtime as needed. Patient not taking: Reported on 10/30/2022 07/01/22   [provider]  esomeprazole (NEXIUM) 40 MG capsule Take 40 mg by mouth daily before breakfast.  10/31/11  [provider]    Current Outpatient Medications  Medication Sig Dispense Refill   ALPRAZolam (XANAX) 0.25 MG tablet Take 0.25 mg by mouth at bedtime as needed for anxiety.     atorvastatin (LIPITOR) 20 MG tablet Take 20 mg by mouth 3 (three) times a week.     diclofenac (CATAFLAM) 50 MG tablet Take 50 mg  by mouth 2 (two) times daily.     hydrochlorothiazide (HYDRODIURIL) 25 MG tablet Take 25 mg by mouth daily.     nebivolol (BYSTOLIC) 10 MG tablet Take 10 mg by mouth daily.     docusate sodium (COLACE) 100 MG capsule Take 100 mg by mouth daily. (Patient not taking: Reported on 10/30/2022)     traZODone (DESYREL) 50 MG tablet Take 50 mg by mouth at bedtime as needed. (Patient not taking: Reported on 10/30/2022)     Current Facility-Administered Medications  Medication Dose Route Frequency Provider Last Rate Last Admin   0.9 %  sodium chloride infusion  500 mL Intravenous Once Capri Raben, Lajuan Lines, MD        Allergies as of 10/30/2022 -  Review Complete 10/30/2022  Allergen Reaction Noted   Morphine Other (See Comments) 06/26/2009   Pantoprazole Cough 06/26/2009    Family History  Problem Relation Age of Onset   Heart disease Mother    Cancer Mother        colon   Colon cancer Mother    Hypertension Father    Heart disease Father    Lung cancer Father    Cancer Father        lung   Stomach cancer Paternal Grandfather    Esophageal cancer Neg Hx    Rectal cancer Neg Hx     Social History   Socioeconomic History   Marital status: Divorced    Spouse name: Not on file   Number of children: Not on file   Years of education: Not on file   Highest education level: Not on file  Occupational History   Not on file  Tobacco Use   Smoking status: Never   Smokeless tobacco: Never  Vaping Use   Vaping Use: Never used  Substance and Sexual Activity   Alcohol use: Yes    Alcohol/week: 7.0 - 10.0 standard drinks of alcohol    Types: 7 - 10 Glasses of wine per week    Comment: 3 glassess a day    Drug use: Never   Sexual activity: Not Currently    Birth control/protection: Post-menopausal  Other Topics Concern   Not on file  Social History Narrative   Not on file   Social Determinants of Health   Financial Resource Strain: Not on file  Food Insecurity: Not on file  Transportation Needs: Not on file  Physical Activity: Not on file  Stress: Not on file  Social Connections: Not on file  Intimate Partner Violence: Not on file    Physical Exam: Vital signs in last 24 hours: '@BP'$  (!) 153/96   Pulse 70   Temp 98 F (36.7 C)   Ht '5\' 4"'$  (1.626 m)   Wt 201 lb (91.2 kg)   SpO2 98%   BMI 34.50 kg/m  GEN: NAD EYE: Sclerae anicteric ENT: MMM CV: Non-tachycardic Pulm: CTA b/l GI: Soft, NT/ND NEURO:  Alert & Oriented x 3   [name], MD [facility]  10/30/2022 2:26 PM

## 2022-10-30 NOTE — Op Note (Signed)
[facility] Patient Name: Chloe Holmes Procedure Date: 10/30/2022 2:26 PM MRN: [id number] Endoscopist: Jerene Bears , MD, [id number] Age: 77 Referring MD:  Date of Birth: [birth year] Gender: Female Account #: [id number] Procedure:                Colonoscopy Indications:              Screening in patient at increased risk: Family                            history of 1st-degree relative with colorectal                            cancer, Last colonoscopy: 2015 Medicines:                Monitored Anesthesia Care Procedure:                Pre-Anesthesia Assessment:                           - Prior to the procedure, a History and Physical                            was performed, and patient medications and                            allergies were reviewed. The patient's tolerance of                            previous anesthesia was also reviewed. The risks                            and benefits of the procedure and the sedation                            options and risks were discussed with the patient.                            All questions were answered, and informed consent                            was obtained. Prior Anticoagulants: The patient has                            taken no anticoagulant or antiplatelet agents. ASA                            Grade Assessment: II - A patient with mild systemic                            disease. After reviewing the risks and benefits,                            the patient was deemed in satisfactory condition to  undergo the procedure.                           After obtaining informed consent, the colonoscope                            was passed under direct vision. Throughout the                            procedure, the patient's blood pressure, pulse, and                            oxygen saturations were monitored continuously. The                            Olympus [id number]  Colonoscope was                            introduced through the anus and advanced to the                            cecum, identified by appendiceal orifice and                            ileocecal valve. The colonoscopy was performed                            without difficulty. The patient tolerated the                            procedure well. The quality of the bowel                            preparation was good. The ileocecal valve,                            appendiceal orifice, and rectum were photographed. Scope In: 2:52:38 PM Scope Out: 3:09:32 PM Scope Withdrawal Time: 0 hours 13 minutes 19 seconds  Total Procedure Duration: 0 hours 16 minutes 54 seconds  Findings:                 The digital rectal exam was normal.                           Two sessile polyps were found in the ascending                            colon. The polyps were 4 to 5 mm in size. These                            polyps were removed with a cold snare. Resection                            and retrieval were complete.  Multiple large-mouthed and small-mouthed                            diverticula were found in the sigmoid colon and                            descending colon.                           A diffuse area of moderate melanosis was found in                            the entire colon.                           Internal hemorrhoids and hypertrophied anal                            papillae were found during retroflexion. The                            hemorrhoids were small. Complications:            No immediate complications. Estimated Blood Loss:     Estimated blood loss was minimal. Impression:               - Two 4 to 5 mm polyps in the ascending colon,                            removed with a cold snare. Resected and retrieved.                           - Moderate diverticulosis in the sigmoid colon and                            in the descending colon.                            - Melanosis in the colon.                           - Internal hemorrhoids. Recommendation:           - Patient has a contact number available for                            emergencies. The signs and symptoms of potential                            delayed complications were discussed with the                            patient. Return to normal activities tomorrow.                            Written discharge instructions were provided to the  patient.                           - Resume previous diet.                           - Continue present medications.                           - Await pathology results.                           - No recommendation at this time regarding repeat                            colonoscopy due to age at next                            screening/surveillance interval (5 years). Jerene Bears, MD 10/30/2022 3:19:00 PM This report has been signed electronically.

## 2022-10-30 NOTE — Op Note (Signed)
[facility] Patient Name: Chloe Holmes Procedure Date: 10/30/2022 2:26 PM MRN: [id number] Endoscopist: Jerene Bears , MD, [id number] Age: 77 Referring MD:  Date of Birth: [birth year] Gender: Female Account #: [id number] Procedure:                Upper GI endoscopy Indications:              Dysphagia, Gastro-esophageal reflux disease Medicines:                Monitored Anesthesia Care Procedure:                Pre-Anesthesia Assessment:                           - Prior to the procedure, a History and Physical                            was performed, and patient medications and                            allergies were reviewed. The patient's tolerance of                            previous anesthesia was also reviewed. The risks                            and benefits of the procedure and the sedation                            options and risks were discussed with the patient.                            All questions were answered, and informed consent                            was obtained. Prior Anticoagulants: The patient has                            taken no anticoagulant or antiplatelet agents. ASA                            Grade Assessment: II - A patient with mild systemic                            disease. After reviewing the risks and benefits,                            the patient was deemed in satisfactory condition to                            undergo the procedure.                           After obtaining informed consent, the endoscope was  passed under direct vision. Throughout the                            procedure, the patient's blood pressure, pulse, and                            oxygen saturations were monitored continuously. The                            Olympus Scope [id number] was introduced through the                            mouth, and advanced to the second part of duodenum.                            The upper  GI endoscopy was accomplished without                            difficulty. The patient tolerated the procedure                            well. Scope In: Scope Out: Findings:                 Two benign-appearing, intrinsic moderate                            (circumferential scarring or stenosis; an endoscope                            may pass) stenoses were found 37 cm from the                            incisors. The narrowest stenosis measured 1.2 cm                            (inner diameter) x 1 cm (in length). The stenoses                            were traversed. A TTS dilator was passed through                            the scope. Dilation with a 15-16.5-18 mm balloon                            dilator was performed to 18 mm. The dilation site                            was examined and showed moderate mucosal disruption.                           A 3 cm hiatal hernia was present.  The entire examined stomach was normal.                           The examined duodenum was normal. Complications:            No immediate complications. Estimated Blood Loss:     Estimated blood loss was minimal. Impression:               - Benign-appearing esophageal stenoses. Dilated to                            18 mm with balloon.                           - 3 cm hiatal hernia.                           - Normal stomach.                           - Normal examined duodenum.                           - No specimens collected. Recommendation:           - Patient has a contact number available for                            emergencies. The signs and symptoms of potential                            delayed complications were discussed with the                            patient. Return to normal activities tomorrow.                            Written discharge instructions were provided to the                            patient.                           -  Post-dilation diet protocol and then resume                            previous diet as tolerated.                           - Continue present medications.                           - Repeat upper endoscopy PRN for retreatment. Jerene Bears, MD 10/30/2022 3:15:23 PM This report has been signed electronically.

## 2022-10-31 ENCOUNTER — Telehealth: Payer: Self-pay | Admitting: *Deleted

## 2022-10-31 NOTE — Telephone Encounter (Signed)
  Follow up Call-     10/30/2022    1:47 PM  Call back number  Post procedure Call Back phone  # [phone]  Permission to leave phone message Yes     Patient questions:  Do you have a fever, pain , or abdominal swelling? No. Pain Score  0 *  Have you tolerated food without any problems? Yes.    Have you been able to return to your normal activities? Yes.    Do you have any questions about your discharge instructions: Diet   No. Medications  No. Follow up visit  No.  Do you have questions or concerns about your Care? No.  Actions: * If pain score is 4 or above: No action needed, pain <4.

## 2022-11-10 ENCOUNTER — Encounter: Payer: Self-pay | Admitting: Internal Medicine

## 2022-12-03 ENCOUNTER — Encounter: Payer: Self-pay | Admitting: Internal Medicine

## 2022-12-04 ENCOUNTER — Other Ambulatory Visit: Payer: Self-pay

## 2022-12-04 MED ORDER — LINACLOTIDE 290 MCG PO CAPS: 290.0000 ug | ORAL_CAPSULE | Freq: Every day | ORAL | 3 refills | Status: DC

## 2022-12-04 NOTE — Telephone Encounter (Signed)
Would d/c Amitiza Would try Linzess 290 mcg daily, best 30 min before breakfast She can try this and let us know if not effective or if side-effects If LE edema does not improve she needs to notify her PCP JMP

## 2022-12-24 DIAGNOSIS — I10 Essential (primary) hypertension: Secondary | ICD-10-CM | POA: Diagnosis not present

## 2022-12-24 DIAGNOSIS — R7989 Other specified abnormal findings of blood chemistry: Secondary | ICD-10-CM | POA: Diagnosis not present

## 2022-12-24 DIAGNOSIS — R739 Hyperglycemia, unspecified: Secondary | ICD-10-CM | POA: Diagnosis not present

## 2022-12-24 DIAGNOSIS — R002 Palpitations: Secondary | ICD-10-CM | POA: Diagnosis not present

## 2022-12-24 DIAGNOSIS — E785 Hyperlipidemia, unspecified: Secondary | ICD-10-CM | POA: Diagnosis not present

## 2022-12-30 DIAGNOSIS — Z1331 Encounter for screening for depression: Secondary | ICD-10-CM | POA: Diagnosis not present

## 2022-12-30 DIAGNOSIS — I700 Atherosclerosis of aorta: Secondary | ICD-10-CM | POA: Diagnosis not present

## 2022-12-30 DIAGNOSIS — I10 Essential (primary) hypertension: Secondary | ICD-10-CM | POA: Diagnosis not present

## 2022-12-30 DIAGNOSIS — E785 Hyperlipidemia, unspecified: Secondary | ICD-10-CM | POA: Diagnosis not present

## 2022-12-30 DIAGNOSIS — Z0000 Encounter for general adult medical examination without abnormal findings: Secondary | ICD-10-CM | POA: Diagnosis not present

## 2022-12-30 DIAGNOSIS — R82998 Other abnormal findings in urine: Secondary | ICD-10-CM | POA: Diagnosis not present

## 2022-12-30 DIAGNOSIS — R739 Hyperglycemia, unspecified: Secondary | ICD-10-CM | POA: Diagnosis not present

## 2022-12-30 DIAGNOSIS — Z1339 Encounter for screening examination for other mental health and behavioral disorders: Secondary | ICD-10-CM | POA: Diagnosis not present

## 2022-12-30 DIAGNOSIS — I77810 Thoracic aortic ectasia: Secondary | ICD-10-CM | POA: Diagnosis not present

## 2022-12-30 DIAGNOSIS — G4700 Insomnia, unspecified: Secondary | ICD-10-CM | POA: Diagnosis not present

## 2022-12-30 DIAGNOSIS — M1990 Unspecified osteoarthritis, unspecified site: Secondary | ICD-10-CM | POA: Diagnosis not present

## 2022-12-30 DIAGNOSIS — E669 Obesity, unspecified: Secondary | ICD-10-CM | POA: Diagnosis not present

## 2023-01-12 ENCOUNTER — Other Ambulatory Visit: Payer: Self-pay | Admitting: Internal Medicine

## 2023-01-12 DIAGNOSIS — Z1231 Encounter for screening mammogram for malignant neoplasm of breast: Secondary | ICD-10-CM

## 2023-01-13 ENCOUNTER — Ambulatory Visit
Admission: RE | Admit: 2023-01-13 | Discharge: 2023-01-13 | Disposition: A | Discharge status: Home / Self Care | Payer: Medicare Other | Source: Ambulatory Visit | Attending: Internal Medicine | Admitting: Internal Medicine

## 2023-01-13 DIAGNOSIS — Z1231 Encounter for screening mammogram for malignant neoplasm of breast: Secondary | ICD-10-CM | POA: Diagnosis not present

## 2023-01-30 DIAGNOSIS — Z471 Aftercare following joint replacement surgery: Secondary | ICD-10-CM | POA: Diagnosis not present

## 2023-01-30 DIAGNOSIS — Z96652 Presence of left artificial knee joint: Secondary | ICD-10-CM | POA: Diagnosis not present

## 2023-02-09 DIAGNOSIS — M1712 Unilateral primary osteoarthritis, left knee: Secondary | ICD-10-CM | POA: Diagnosis not present

## 2023-02-11 DIAGNOSIS — M1712 Unilateral primary osteoarthritis, left knee: Secondary | ICD-10-CM | POA: Diagnosis not present

## 2023-02-16 DIAGNOSIS — M1712 Unilateral primary osteoarthritis, left knee: Secondary | ICD-10-CM | POA: Diagnosis not present

## 2023-02-18 DIAGNOSIS — M1712 Unilateral primary osteoarthritis, left knee: Secondary | ICD-10-CM | POA: Diagnosis not present

## 2023-02-23 DIAGNOSIS — M1712 Unilateral primary osteoarthritis, left knee: Secondary | ICD-10-CM | POA: Diagnosis not present

## 2023-03-16 DIAGNOSIS — M25552 Pain in left hip: Secondary | ICD-10-CM | POA: Diagnosis not present

## 2023-03-16 DIAGNOSIS — M48062 Spinal stenosis, lumbar region with neurogenic claudication: Secondary | ICD-10-CM | POA: Diagnosis not present

## 2023-03-16 DIAGNOSIS — M4180 Other forms of scoliosis, site unspecified: Secondary | ICD-10-CM | POA: Diagnosis not present

## 2023-03-31 ENCOUNTER — Other Ambulatory Visit: Payer: Self-pay | Admitting: Internal Medicine

## 2023-07-05 ENCOUNTER — Emergency Department (HOSPITAL_COMMUNITY): Payer: Medicare Other

## 2023-07-05 ENCOUNTER — Other Ambulatory Visit: Payer: Self-pay

## 2023-07-05 ENCOUNTER — Inpatient Hospital Stay (HOSPITAL_COMMUNITY): Payer: Medicare Other

## 2023-07-05 ENCOUNTER — Inpatient Hospital Stay (HOSPITAL_COMMUNITY)
Admission: EM | Admit: 2023-07-05 | Discharge: 2023-07-09 | DRG: 100 | Disposition: A | Discharge status: Rehabilitation Facility | Payer: Medicare Other | Attending: Family Medicine | Admitting: Family Medicine

## 2023-07-05 ENCOUNTER — Encounter (HOSPITAL_COMMUNITY): Payer: Self-pay | Admitting: Internal Medicine

## 2023-07-05 DIAGNOSIS — S0231XA Fracture of orbital floor, right side, initial encounter for closed fracture: Secondary | ICD-10-CM

## 2023-07-05 DIAGNOSIS — R29701 NIHSS score 1: Secondary | ICD-10-CM | POA: Diagnosis not present

## 2023-07-05 DIAGNOSIS — S60512A Abrasion of left hand, initial encounter: Secondary | ICD-10-CM | POA: Diagnosis not present

## 2023-07-05 DIAGNOSIS — H532 Diplopia: Secondary | ICD-10-CM | POA: Diagnosis not present

## 2023-07-05 DIAGNOSIS — S0230XA Fracture of orbital floor, unspecified side, initial encounter for closed fracture: Secondary | ICD-10-CM | POA: Diagnosis not present

## 2023-07-05 DIAGNOSIS — S60511A Abrasion of right hand, initial encounter: Secondary | ICD-10-CM | POA: Diagnosis not present

## 2023-07-05 DIAGNOSIS — Z96652 Presence of left artificial knee joint: Secondary | ICD-10-CM | POA: Diagnosis not present

## 2023-07-05 DIAGNOSIS — Z8249 Family history of ischemic heart disease and other diseases of the circulatory system: Secondary | ICD-10-CM | POA: Diagnosis not present

## 2023-07-05 DIAGNOSIS — Z9183 Wandering in diseases classified elsewhere: Secondary | ICD-10-CM | POA: Diagnosis not present

## 2023-07-05 DIAGNOSIS — S0231XB Fracture of orbital floor, right side, initial encounter for open fracture: Secondary | ICD-10-CM | POA: Diagnosis not present

## 2023-07-05 DIAGNOSIS — Z885 Allergy status to narcotic agent status: Secondary | ICD-10-CM | POA: Diagnosis not present

## 2023-07-05 DIAGNOSIS — R29705 NIHSS score 5: Secondary | ICD-10-CM | POA: Diagnosis present

## 2023-07-05 DIAGNOSIS — S199XXA Unspecified injury of neck, initial encounter: Secondary | ICD-10-CM | POA: Diagnosis not present

## 2023-07-05 DIAGNOSIS — Y92008 Other place in unspecified non-institutional (private) residence as the place of occurrence of the external cause: Secondary | ICD-10-CM

## 2023-07-05 DIAGNOSIS — E669 Obesity, unspecified: Secondary | ICD-10-CM | POA: Diagnosis present

## 2023-07-05 DIAGNOSIS — R29704 NIHSS score 4: Secondary | ICD-10-CM | POA: Diagnosis not present

## 2023-07-05 DIAGNOSIS — M4313 Spondylolisthesis, cervicothoracic region: Secondary | ICD-10-CM | POA: Diagnosis not present

## 2023-07-05 DIAGNOSIS — S02831A Fracture of medial orbital wall, right side, initial encounter for closed fracture: Secondary | ICD-10-CM | POA: Diagnosis not present

## 2023-07-05 DIAGNOSIS — S0231XD Fracture of orbital floor, right side, subsequent encounter for fracture with routine healing: Secondary | ICD-10-CM | POA: Diagnosis not present

## 2023-07-05 DIAGNOSIS — W19XXXA Unspecified fall, initial encounter: Secondary | ICD-10-CM | POA: Diagnosis not present

## 2023-07-05 DIAGNOSIS — Z888 Allergy status to other drugs, medicaments and biological substances status: Secondary | ICD-10-CM

## 2023-07-05 DIAGNOSIS — R441 Visual hallucinations: Secondary | ICD-10-CM | POA: Diagnosis not present

## 2023-07-05 DIAGNOSIS — R4701 Aphasia: Secondary | ICD-10-CM | POA: Diagnosis not present

## 2023-07-05 DIAGNOSIS — R269 Unspecified abnormalities of gait and mobility: Secondary | ICD-10-CM | POA: Diagnosis not present

## 2023-07-05 DIAGNOSIS — R569 Unspecified convulsions: Principal | ICD-10-CM

## 2023-07-05 DIAGNOSIS — E871 Hypo-osmolality and hyponatremia: Secondary | ICD-10-CM | POA: Diagnosis present

## 2023-07-05 DIAGNOSIS — R040 Epistaxis: Secondary | ICD-10-CM | POA: Diagnosis not present

## 2023-07-05 DIAGNOSIS — E876 Hypokalemia: Secondary | ICD-10-CM | POA: Diagnosis present

## 2023-07-05 DIAGNOSIS — M2041 Other hammer toe(s) (acquired), right foot: Secondary | ICD-10-CM | POA: Diagnosis not present

## 2023-07-05 DIAGNOSIS — I10 Essential (primary) hypertension: Secondary | ICD-10-CM | POA: Diagnosis present

## 2023-07-05 DIAGNOSIS — S0292XA Unspecified fracture of facial bones, initial encounter for closed fracture: Secondary | ICD-10-CM | POA: Diagnosis present

## 2023-07-05 DIAGNOSIS — S0291XA Unspecified fracture of skull, initial encounter for closed fracture: Secondary | ICD-10-CM | POA: Diagnosis not present

## 2023-07-05 DIAGNOSIS — I639 Cerebral infarction, unspecified: Principal | ICD-10-CM

## 2023-07-05 DIAGNOSIS — E66811 Obesity, class 1: Secondary | ICD-10-CM | POA: Diagnosis not present

## 2023-07-05 DIAGNOSIS — S0292XB Unspecified fracture of facial bones, initial encounter for open fracture: Secondary | ICD-10-CM

## 2023-07-05 DIAGNOSIS — R29703 NIHSS score 3: Secondary | ICD-10-CM | POA: Diagnosis not present

## 2023-07-05 DIAGNOSIS — D72829 Elevated white blood cell count, unspecified: Secondary | ICD-10-CM | POA: Diagnosis not present

## 2023-07-05 DIAGNOSIS — Y92009 Unspecified place in unspecified non-institutional (private) residence as the place of occurrence of the external cause: Secondary | ICD-10-CM

## 2023-07-05 DIAGNOSIS — Z800 Family history of malignant neoplasm of digestive organs: Secondary | ICD-10-CM | POA: Diagnosis not present

## 2023-07-05 DIAGNOSIS — M47812 Spondylosis without myelopathy or radiculopathy, cervical region: Secondary | ICD-10-CM | POA: Diagnosis not present

## 2023-07-05 DIAGNOSIS — W010XXD Fall on same level from slipping, tripping and stumbling without subsequent striking against object, subsequent encounter: Secondary | ICD-10-CM | POA: Diagnosis not present

## 2023-07-05 DIAGNOSIS — I69320 Aphasia following cerebral infarction: Secondary | ICD-10-CM | POA: Diagnosis not present

## 2023-07-05 DIAGNOSIS — I700 Atherosclerosis of aorta: Secondary | ICD-10-CM | POA: Diagnosis not present

## 2023-07-05 DIAGNOSIS — M48062 Spinal stenosis, lumbar region with neurogenic claudication: Secondary | ICD-10-CM | POA: Diagnosis not present

## 2023-07-05 DIAGNOSIS — I5189 Other ill-defined heart diseases: Secondary | ICD-10-CM | POA: Diagnosis not present

## 2023-07-05 DIAGNOSIS — M1990 Unspecified osteoarthritis, unspecified site: Secondary | ICD-10-CM | POA: Diagnosis present

## 2023-07-05 DIAGNOSIS — Z79899 Other long term (current) drug therapy: Secondary | ICD-10-CM | POA: Diagnosis not present

## 2023-07-05 DIAGNOSIS — I63532 Cerebral infarction due to unspecified occlusion or stenosis of left posterior cerebral artery: Secondary | ICD-10-CM | POA: Diagnosis not present

## 2023-07-05 DIAGNOSIS — Z90710 Acquired absence of both cervix and uterus: Secondary | ICD-10-CM

## 2023-07-05 DIAGNOSIS — S0993XA Unspecified injury of face, initial encounter: Secondary | ICD-10-CM | POA: Diagnosis not present

## 2023-07-05 DIAGNOSIS — R4182 Altered mental status, unspecified: Secondary | ICD-10-CM | POA: Diagnosis not present

## 2023-07-05 DIAGNOSIS — R443 Hallucinations, unspecified: Secondary | ICD-10-CM | POA: Diagnosis present

## 2023-07-05 DIAGNOSIS — R29702 NIHSS score 2: Secondary | ICD-10-CM | POA: Diagnosis not present

## 2023-07-05 DIAGNOSIS — I6389 Other cerebral infarction: Secondary | ICD-10-CM | POA: Diagnosis not present

## 2023-07-05 DIAGNOSIS — S0232XA Fracture of orbital floor, left side, initial encounter for closed fracture: Secondary | ICD-10-CM | POA: Diagnosis not present

## 2023-07-05 DIAGNOSIS — Z87898 Personal history of other specified conditions: Secondary | ICD-10-CM

## 2023-07-05 DIAGNOSIS — K219 Gastro-esophageal reflux disease without esophagitis: Secondary | ICD-10-CM | POA: Diagnosis present

## 2023-07-05 DIAGNOSIS — S0083XD Contusion of other part of head, subsequent encounter: Secondary | ICD-10-CM | POA: Diagnosis not present

## 2023-07-05 DIAGNOSIS — E785 Hyperlipidemia, unspecified: Secondary | ICD-10-CM | POA: Diagnosis present

## 2023-07-05 DIAGNOSIS — F419 Anxiety disorder, unspecified: Secondary | ICD-10-CM | POA: Diagnosis present

## 2023-07-05 DIAGNOSIS — Z23 Encounter for immunization: Secondary | ICD-10-CM | POA: Diagnosis not present

## 2023-07-05 DIAGNOSIS — G40909 Epilepsy, unspecified, not intractable, without status epilepticus: Secondary | ICD-10-CM | POA: Diagnosis not present

## 2023-07-05 DIAGNOSIS — M4802 Spinal stenosis, cervical region: Secondary | ICD-10-CM | POA: Diagnosis not present

## 2023-07-05 DIAGNOSIS — R4781 Slurred speech: Secondary | ICD-10-CM | POA: Diagnosis not present

## 2023-07-05 DIAGNOSIS — I6782 Cerebral ischemia: Secondary | ICD-10-CM | POA: Diagnosis not present

## 2023-07-05 DIAGNOSIS — S0231XS Fracture of orbital floor, right side, sequela: Secondary | ICD-10-CM | POA: Diagnosis not present

## 2023-07-05 DIAGNOSIS — I6622 Occlusion and stenosis of left posterior cerebral artery: Secondary | ICD-10-CM | POA: Diagnosis not present

## 2023-07-05 DIAGNOSIS — Z602 Problems related to living alone: Secondary | ICD-10-CM | POA: Diagnosis not present

## 2023-07-05 DIAGNOSIS — S0031XA Abrasion of nose, initial encounter: Secondary | ICD-10-CM | POA: Diagnosis present

## 2023-07-05 DIAGNOSIS — S0990XA Unspecified injury of head, initial encounter: Secondary | ICD-10-CM | POA: Diagnosis not present

## 2023-07-05 DIAGNOSIS — Z043 Encounter for examination and observation following other accident: Secondary | ICD-10-CM | POA: Diagnosis not present

## 2023-07-05 DIAGNOSIS — Z6835 Body mass index (BMI) 35.0-35.9, adult: Secondary | ICD-10-CM | POA: Diagnosis not present

## 2023-07-05 DIAGNOSIS — R509 Fever, unspecified: Secondary | ICD-10-CM | POA: Diagnosis not present

## 2023-07-05 DIAGNOSIS — I69398 Other sequelae of cerebral infarction: Secondary | ICD-10-CM | POA: Diagnosis not present

## 2023-07-05 DIAGNOSIS — M1909 Primary osteoarthritis, other specified site: Secondary | ICD-10-CM | POA: Diagnosis not present

## 2023-07-05 DIAGNOSIS — Z801 Family history of malignant neoplasm of trachea, bronchus and lung: Secondary | ICD-10-CM | POA: Diagnosis not present

## 2023-07-05 DIAGNOSIS — S0510XA Contusion of eyeball and orbital tissues, unspecified eye, initial encounter: Secondary | ICD-10-CM | POA: Diagnosis not present

## 2023-07-05 LAB — RAPID URINE DRUG SCREEN, HOSP PERFORMED
Amphetamines: NOT DETECTED
Barbiturates: NOT DETECTED
Benzodiazepines: NOT DETECTED
Cocaine: NOT DETECTED
Opiates: NOT DETECTED
Tetrahydrocannabinol: NOT DETECTED

## 2023-07-05 LAB — ETHANOL: Alcohol, Ethyl (B): <10 mg/dL (ref <10)

## 2023-07-05 LAB — COMPREHENSIVE METABOLIC PANEL
ALT: 21 U/L (ref 0–44)
AST: 26 U/L (ref 15–41)
Albumin: 4.2 g/dL (ref 3.5–5.0)
Alkaline Phosphatase: 58 U/L (ref 38–126)
Anion gap: 14 (ref 5–15)
BUN: 31 mg/dL — ABNORMAL HIGH (ref 8–23)
CO2: 20 mmol/L — ABNORMAL LOW (ref 22–32)
Calcium: 9.6 mg/dL (ref 8.9–10.3)
Chloride: 100 mmol/L (ref 98–111)
Creatinine, Ser: 0.88 mg/dL (ref 0.44–1.00)
GFR, Estimated: >60 mL/min (ref >60)
Glucose, Bld: 133 mg/dL — ABNORMAL HIGH (ref 70–99)
Potassium: 3.6 mmol/L (ref 3.5–5.1)
Sodium: 134 mmol/L — ABNORMAL LOW (ref 135–145)
Total Bilirubin: 0.9 mg/dL (ref <1.2)
Total Protein: 7.2 g/dL (ref 6.5–8.1)

## 2023-07-05 LAB — DIFFERENTIAL
Abs Immature Granulocytes: 0.08 10*3/uL — ABNORMAL HIGH (ref 0.00–0.07)
Basophils Absolute: 0.1 10*3/uL (ref 0.0–0.1)
Basophils Relative: 1 %
Eosinophils Absolute: 0.1 10*3/uL (ref 0.0–0.5)
Eosinophils Relative: 1 %
Immature Granulocytes: 1 %
Lymphocytes Relative: 7 %
Lymphs Abs: 1.1 10*3/uL (ref 0.7–4.0)
Monocytes Absolute: 0.6 10*3/uL (ref 0.1–1.0)
Monocytes Relative: 4 %
Neutro Abs: 13.1 10*3/uL — ABNORMAL HIGH (ref 1.7–7.7)
Neutrophils Relative %: 86 %

## 2023-07-05 LAB — APTT: aPTT: 28 s (ref 24–36)

## 2023-07-05 LAB — GLUCOSE, CAPILLARY: Glucose-Capillary: 86 mg/dL (ref 70–99)

## 2023-07-05 LAB — URINALYSIS, ROUTINE W REFLEX MICROSCOPIC
Bilirubin Urine: NEGATIVE
Glucose, UA: NEGATIVE mg/dL
Hgb urine dipstick: NEGATIVE
Ketones, ur: 20 mg/dL — AB
Leukocytes,Ua: NEGATIVE
Nitrite: NEGATIVE
Protein, ur: NEGATIVE mg/dL
Specific Gravity, Urine: 1.023 (ref 1.005–1.030)
pH: 6 (ref 5.0–8.0)

## 2023-07-05 LAB — LIPID PANEL
Cholesterol: 186 mg/dL (ref 0–200)
HDL: 77 mg/dL (ref >40)
LDL Cholesterol: 94 mg/dL (ref 0–99)
Total CHOL/HDL Ratio: 2.4 {ratio}
Triglycerides: 75 mg/dL (ref <150)
VLDL: 15 mg/dL (ref 0–40)

## 2023-07-05 LAB — CBC
HCT: 41.9 % (ref 36.0–46.0)
Hemoglobin: 13.7 g/dL (ref 12.0–15.0)
MCH: 30.2 pg (ref 26.0–34.0)
MCHC: 32.7 g/dL (ref 30.0–36.0)
MCV: 92.3 fL (ref 80.0–100.0)
Platelets: 202 10*3/uL (ref 150–400)
RBC: 4.54 MIL/uL (ref 3.87–5.11)
RDW: 13.1 % (ref 11.5–15.5)
WBC: 14.9 10*3/uL — ABNORMAL HIGH (ref 4.0–10.5)
nRBC: 0 % (ref 0.0–0.2)

## 2023-07-05 LAB — HEMOGLOBIN A1C
Hgb A1c MFr Bld: 5.2 % (ref 4.8–5.6)
Mean Plasma Glucose: 102.54 mg/dL

## 2023-07-05 LAB — PROTIME-INR
INR: 1 (ref 0.8–1.2)
Prothrombin Time: 13.2 s (ref 11.4–15.2)

## 2023-07-05 LAB — I-STAT CHEM 8, ED
BUN: 31 mg/dL — ABNORMAL HIGH (ref 8–23)
Calcium, Ion: 1.04 mmol/L — ABNORMAL LOW (ref 1.15–1.40)
Chloride: 100 mmol/L (ref 98–111)
Creatinine, Ser: 0.9 mg/dL (ref 0.44–1.00)
Glucose, Bld: 132 mg/dL — ABNORMAL HIGH (ref 70–99)
HCT: 45 % (ref 36.0–46.0)
Hemoglobin: 15.3 g/dL — ABNORMAL HIGH (ref 12.0–15.0)
Potassium: 3.5 mmol/L (ref 3.5–5.1)
Sodium: 133 mmol/L — ABNORMAL LOW (ref 135–145)
TCO2: 21 mmol/L — ABNORMAL LOW (ref 22–32)

## 2023-07-05 LAB — TSH: TSH: 2.239 u[IU]/mL (ref 0.350–4.500)

## 2023-07-05 MED ORDER — SODIUM CHLORIDE 0.9 % IV SOLN
12.5000 mg | Freq: Once | INTRAVENOUS | Status: AC
  Administered 2023-07-05: 12.5 mg via INTRAVENOUS
  Filled 2023-07-05 (×2): qty 0.5

## 2023-07-05 MED ORDER — ACETAMINOPHEN 160 MG/5ML PO SOLN: 650.0000 mg | ORAL | Status: DC | PRN

## 2023-07-05 MED ORDER — BUTALBITAL-APAP-CAFFEINE 50-325-40 MG PO TABS
1.0000 | ORAL_TABLET | Freq: Four times a day (QID) | ORAL | Status: DC | PRN
  Administered 2023-07-06 – 2023-07-07 (×3): 1 via ORAL
  Filled 2023-07-05 (×4): qty 1

## 2023-07-05 MED ORDER — IOHEXOL 350 MG/ML SOLN
40.0000 mL | Freq: Once | INTRAVENOUS | Status: AC | PRN
Start: 2023-07-05 — End: 2023-07-05
  Administered 2023-07-05: 40 mL via INTRAVENOUS

## 2023-07-05 MED ORDER — IOHEXOL 350 MG/ML SOLN
75.0000 mL | Freq: Once | INTRAVENOUS | Status: AC | PRN
  Administered 2023-07-05: 75 mL via INTRAVENOUS

## 2023-07-05 MED ORDER — DIPHENHYDRAMINE HCL 50 MG/ML IJ SOLN
12.5000 mg | Freq: Once | INTRAMUSCULAR | Status: AC
  Administered 2023-07-05: 12.5 mg via INTRAVENOUS
  Filled 2023-07-05: qty 1

## 2023-07-05 MED ORDER — DEXTROSE 5 % IV SOLN: INTRAVENOUS | Status: AC

## 2023-07-05 MED ORDER — SODIUM CHLORIDE 0.9 % IV SOLN: 3000.0000 mg | Freq: Once | INTRAVENOUS | Status: DC

## 2023-07-05 MED ORDER — SODIUM CHLORIDE 0.9 % IV SOLN: INTRAVENOUS | Status: DC

## 2023-07-05 MED ORDER — LEVETIRACETAM IN NACL 1500 MG/100ML IV SOLN
1500.0000 mg | Freq: Once | INTRAVENOUS | Status: AC
  Administered 2023-07-05: 1500 mg via INTRAVENOUS
  Filled 2023-07-05: qty 100

## 2023-07-05 MED ORDER — ACETAMINOPHEN 650 MG RE SUPP
650.0000 mg | RECTAL | Status: DC | PRN
  Administered 2023-07-05: 650 mg via RECTAL
  Filled 2023-07-05: qty 1

## 2023-07-05 MED ORDER — ENOXAPARIN SODIUM 40 MG/0.4ML IJ SOSY
40.0000 mg | PREFILLED_SYRINGE | INTRAMUSCULAR | Status: DC
Start: 2023-07-05 — End: 2023-07-09
  Administered 2023-07-05 – 2023-07-08 (×4): 40 mg via SUBCUTANEOUS
  Filled 2023-07-05 (×4): qty 0.4

## 2023-07-05 MED ORDER — SENNOSIDES-DOCUSATE SODIUM 8.6-50 MG PO TABS: 1.0000 | ORAL_TABLET | Freq: Every evening | ORAL | Status: DC | PRN

## 2023-07-05 MED ORDER — STROKE: EARLY STAGES OF RECOVERY BOOK
Freq: Once | Status: AC
Start: 2023-07-06 — End: 2023-07-06

## 2023-07-05 MED ORDER — ACETAMINOPHEN 325 MG PO TABS
650.0000 mg | ORAL_TABLET | ORAL | Status: DC | PRN
  Administered 2023-07-06: 650 mg via ORAL
  Filled 2023-07-05: qty 2

## 2023-07-05 MED ORDER — FENTANYL CITRATE PF 50 MCG/ML IJ SOSY: 12.5000 ug | PREFILLED_SYRINGE | INTRAMUSCULAR | Status: DC | PRN

## 2023-07-05 NOTE — ED Notes (Signed)
Assisted to bed side commode. Urine collected. Complains of severe headache. Provider and receiving nurse informed.

## 2023-07-05 NOTE — Consult Note (Signed)
Ophthalmology Consult Note  HPI: Patient is a 77 y.o. 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 Patient was diagnosed with right orbital floor fracture with possible entrapment. CT notes herniated intraorbital fat, marginally herniated inferior rectus muscle. Minimal intraorbital contusion associated. Just prior to exam, patient was disoriented and thrashing in bed, speaking word salad to the nurse. Medication was given to help with agitation and at the time of exam, patient is somnolent and asleep. Minimal interaction during exam.   Eye exam   VA on near card - unable  CVF - unable EOM - unable to assess volitional EOM, proparicaine was instilled OS and the right globe was able to be moved in all directions on forced duction Pupils - equally round and reactive IOP - 13 OU  L/L - large periorbital bruising and edema - no enophthalmos or protosis OD, wnl OS C/S - mild injection OD, wnl OS AC - deep and quiet ou Iris- round and reactive ou  Lens - NS   DFE deferred as patient is undergoing continuous EEG/under neuro care    Assessment and Plan: Orbital floor fracture OD - no signs of entrapment, no intervention at this time. ENT evaluated and would consider repair in 5-7 days if diplopia occurs - Unable to assess vision due to patient's expressive and receptive aphasia. Anatomically, the globe appears normal. Counseled friend at bedside that visual assessment would be unreliable until aphasia improves. Follow up with her eye doctor in the future.    Chloe Holmes  [facility]

## 2023-07-05 NOTE — H&P (Addendum)
History and Physical    Patient: Chloe Holmes WUJ:[id number] DOB: 02/25/1946 DOA: 07/05/2023 DOS: the patient was seen and examined on 07/05/2023 PCP: Creola Corn, MD  Patient coming from: Home via EMS  Chief Complaint:  Chief Complaint  Patient presents with   Code Stroke   HPI: Chloe Holmes is a 77 y.o. female with medical history significant of hypertension, hyperlipidemia, PVCs, anxiety, arthritis, hiatal hernia, GERD, and obesity after having a fall at home.  Patient's Apple Watch called EMS around 3:15 AM this morning.  History is limited from the patient due to her having difficulty getting the words out what she wants to say.  Apparently, she had been outside last night and coming back into the house when she fell.  She did hit her right eye, but is not clear on what.  She tried yelling for help, but no one came.  Her Apple watch to call for help eventually although they had difficulty understanding what she was saying.  She report having pain on the right eye as well as difficulty seeing on the right side of her visual field.  Records note prior evaluation due to reports of palpitations back in 2022 where patient had echocardiogram which noted preserved heart function at that time.  Patient makes note that she spends half of her time in [place] and has a neighbor that is a doctor here at the hospital.  Over the phone patient's daughter makes note that she had reported not feeling well the other day.  At baseline patient doesn't have great balance and sometimes uses a cane to ambulate.    Patient presented to the emergency department as a code stroke.  She was noted to be afebrile with pulse of 56-81, respirations 17-24, blood pressures elevated up to 176/78, and O2 saturations currently maintained on room air.  Labs significant for WBC 14.9, sodium 134, BUN 31, creatinine 0.88, INR 1, alcohol level undetectable. Initial CT scan of the head and maxillofacial noted no acute  cortical-based infarct or signs of hemorrhage, but did reveal acute traumatic right orbital floor fracture along with scalp and face soft tissue injuries.  Patient was not had thrombolytics candidate due to unclear time of onset. CT angiogram of the head and neck was positive for severe short segment left PCA P1 stenosis, but no core infarct noted on perfusion study.  Not a candidate for thrombectomy as there was no large vessel occlusion noted.  The cause of symptoms was not totally clear questioning stroke versus TBI/concussion versus epileptic aphasia.  Neurology orders to load patient with Keppra as well as check MRI of the brain.  If MRI of the brain negative we will follow-up with the EEG.  Dr. Elijah Birk of ENT and Dr. Wynelle Link of ophthalmology or consulted in regards to facial fractures for further evaluation as there was concerns for possibility of entrapment of inferior rectus muscles on the right.   Review of Systems: unable to review all systems due to the inability of the patient to answer questions. Past Medical History:  Diagnosis Date   Arthritis    Diverticulosis    Dyspnea    At pre-op , pt  (a retired Insurance underwriter) reports past Hx of SOB that she found out was being caused by frequent PVCs, she believes the PVCs were related to her poor tolerance of daily statin medication regimen ; however reports resolution of SOB now that she is only taking statin three times a week and taking bystolic to  manage dysrhythmia.   GERD (gastroesophageal reflux disease)    History of hiatal hernia    HLD (hyperlipidemia)    Hypertension    Past Surgical History:  Procedure Laterality Date   ABDOMINAL HYSTERECTOMY     CERVICAL DISC SURGERY     COLONOSCOPY  2016   DILATION AND CURETTAGE OF UTERUS     ESOPHAGEAL MANOMETRY N/A 10/03/2013   Procedure: ESOPHAGEAL MANOMETRY (EM);  Surgeon: Beverley Fiedler, MD;  Location: [facility];  Service: Gastroenterology;  Laterality: N/A;   EYE SURGERY     bilateral  cataracts    ORBITAL FRACTURE SURGERY     TOTAL KNEE ARTHROPLASTY Left 01/27/2019   Procedure: TOTAL KNEE ARTHROPLASTY;  Surgeon: Durene Romans, MD;  Location: [facility];  Service: Orthopedics;  Laterality: Left;  70 mins   Social History:  reports that she has never smoked. She has never used smokeless tobacco. She reports current alcohol use of about 7.0 - 10.0 standard drinks of alcohol per week. She reports that she does not use drugs.  Allergies  Allergen Reactions   Morphine Other (See Comments)    Hypotension; had to be narcan'd . This was 40 years ago   Pantoprazole Cough    Family History  Problem Relation Age of Onset   Heart disease Mother    Cancer Mother        colon   Colon cancer Mother    Hypertension Father    Heart disease Father    Lung cancer Father    Cancer Father        lung   Stomach cancer Paternal Grandfather    Esophageal cancer Neg Hx    Rectal cancer Neg Hx    Breast cancer Neg Hx     Prior to Admission medications   Medication Sig Start Date End Date Taking? Authorizing Provider  ALPRAZolam Prudy Feeler) 0.25 MG tablet Take 0.25 mg by mouth at bedtime as needed for anxiety.    [provider]  atorvastatin (LIPITOR) 20 MG tablet Take 20 mg by mouth 3 (three) times a week.    [provider]  diclofenac (CATAFLAM) 50 MG tablet Take 50 mg by mouth 2 (two) times daily. 09/19/11   [provider]  docusate sodium (COLACE) 100 MG capsule Take 100 mg by mouth daily. Patient not taking: Reported on 10/30/2022    [provider]  hydrochlorothiazide (HYDRODIURIL) 25 MG tablet Take 25 mg by mouth daily.    [provider]  LINZESS 290 MCG CAPS capsule Take 1 capsule (290 mcg total) by mouth daily before breakfast. 03/31/23   Pyrtle, Carie Caddy, MD  nebivolol (BYSTOLIC) 10 MG tablet Take 10 mg by mouth daily.    [provider]  traZODone (DESYREL) 50 MG tablet Take 50 mg by mouth at bedtime as needed. Patient not taking:  Reported on 10/30/2022 07/01/22   [provider]  esomeprazole (NEXIUM) 40 MG capsule Take 40 mg by mouth daily before breakfast.  10/31/11  [provider]    Physical Exam: Vitals:   07/05/23 0530 07/05/23 0600 07/05/23 0709 07/05/23 0714  BP: (!) 160/79 (!) 159/77 (!) 157/65 (!) 149/93  Pulse: (!) 56 (!) 57 69 81  Resp: 17 17 17  (!) 21  Temp:   97.7 F (36.5 C) 97.7 F (36.5 C)  TempSrc:    Oral  SpO2: 100% 98% 100% 100%  Weight:      Height:       Constitutional: Elderly female  who appears to be in no acute distress and able to follow commands Eyes: PERRL, significant bruising periorbital bruising of the right eye. ENMT: Mucous membranes are moist.  Some blood present on the naris..  Neck: normal, supple  Respiratory: clear to auscultation bilaterally, no wheezing, no crackles. Normal respiratory effort. No accessory muscle use.  Cardiovascular: Regular rate and rhythm, no murmurs / rubs / gallops.  No significant lower extremity edema. 2+ pedal pulses.   Abdomen: no tenderness, no masses palpated. Bowel sounds positive.  Musculoskeletal: no clubbing / cyanosis. No joint deformity upper and lower extremities. Good ROM, no contractures. Normal muscle tone.  Skin: no rashes, lesions, ulcers. No induration Neurologic: CN 2-12 grossly intact.  Appears able to move all extremities without focal deficit.  Expressive aphasia more so than receptive.  Decreased visual acuity noted on the right visual field. Psychiatric: Normal judgment and insight. Alert and oriented x 3. Normal mood.   Data Reviewed:  EKG reveals sinus rhythm at 64 bpm with suspected left atrial enlargement, but significant background artifact appreciated.  Reviewed labs, imaging, and pertinent records as documented.  Assessment and Plan:  Aphasia Left PCA stenosis Acute.  Patient presents after having fall found to be speaking in word salad.  Expressive aphasia worse than receptive.  Initial CT scans  of the brain did not note any acute signs for intracranial infarct or hemorrhage.  Patient was not a candidate for thrombolytics due to unclear time of onset.  CT angiogram of the head and neck did not note severe short segment left PCA P1 stenosis.  Question cause of symptoms including stroke versus traumatic brain injury/postconcussive syndrome versus epileptic aphasia as a cause of symptoms. Patient had loaded with Keppra IV. -Admit to a medical telemetry bed -Stroke order set utilized -Neurochecks -N.p.o. until able to pass swallow eval -Check lipid panel -Check MRI of the brain -Check EEG if MRI brain negative -PT/OT/speech to eval and treat -Appreciate neurology consultative services we will follow-up for any further recommendations  Facial fractures secondary to fall fall at home Acute.  Patient presents after having a fall at home prior to arrival.  CT maxillofacial study noted  acute right orbital floor and lamina papyracea fractures, comminuted and displaced right floor fracture with herniated intraorbital fat, marginally herniated inferior rectus muscle, minimal intraorbital contusion associated, and hemorrhage in the right maxillary sinus.  There was concern for possible entrapment.  Dr. Elijah Birk of ENT and Dr. Wynelle Link of ophthalmology were consulted. -Fall precautions -Cold compresses -PT/OT -Appreciate ENT and ophthalmology consultative services,  will follow up for any further recommendations  Leukocytosis Acute.  WBC elevated at 14.9.  Possibly secondary to above. -Check urinalysis -Repeat CBC in a.m.  History of palpitations Patient had been evaluated previously for palpitations and appears symptoms were thought secondary to PVCs. -Check TSH -Follow-up telemetry  Hyponatremia Acute.  Sodium mildly low at 134.  Suspect likely hypovolemic hyponatremia given elevated BUN to creatinine ratio and gives concern for possible dehydration. -Normal saline IV fluids at 75  mL/h -Recheck sodium levels in a.m.  Diastolic dysfunction Patient appears euvolemic at this time.  Last echocardiogram noted EF to be 55% with grade 1 diastolic dysfunction back in 04/2022.  Hyperlipidemia -Follow-up lipid panel -Continue atorvastatin  Obesity BMI 35.96 kg/m. -Recommend outpatient follow-up with primary care provider in regards to diet and lifestyle modification  DVT prophylaxis: Lovenox Advance Care Planning:   Code Status: Full Code   Consults: Neurology, ENT, ophthalmology  Family Communication: Daughter  updated over the phone.  Severity of Illness: The appropriate patient status for this patient is INPATIENT. Inpatient status is judged to be reasonable and necessary in order to provide the required intensity of service to ensure the patient's safety. The patient's presenting symptoms, physical exam findings, and initial radiographic and laboratory data in the context of their chronic comorbidities is felt to place them at high risk for further clinical deterioration. Furthermore, it is not anticipated that the patient will be medically stable for discharge from the hospital within 2 midnights of admission.   * I certify that at the point of admission it is my clinical judgment that the patient will require inpatient hospital care spanning beyond 2 midnights from the point of admission due to high intensity of service, high risk for further deterioration and high frequency of surveillance required.*  Author: Clydie Braun, MD 07/05/2023 7:18 AM  For on call review [email or web address].

## 2023-07-05 NOTE — Progress Notes (Signed)
LTM EEG hooked up and running - no initial skin breakdown - push button tested - No [facility]; patient currently in the ED.

## 2023-07-05 NOTE — Consult Note (Signed)
NEUROLOGY CONSULT NOTE   Date of service: July 05, 2023 Patient Name: Chloe Holmes MRN:  [id number] DOB:  03/16/1946 Chief Complaint: "aphasia, fall" Requesting Provider: Glynn Octave, MD  History of Present Illness  Antanette Conry is a 77 y.o. female with hx of GERD, HTN, HLD who presents with fall and expressive greater than receptive aphasia. She is unable to provide much hsitory secondary to aphasia. She lives by herself. Apparently, EMS got a call from her apple watch about a fall around 0315. They went in to check in on her and found her in the backyard inbetween 3 houses. Noted bruising on her nose and R hand and word salad and speech not making sense. No bystanders or family to clarify last seen normal. She was brought in as a code stroke due to suspicion that her aphasia was most likely within the last couple of hours.   LKW: unclear Modified rankin score: unclear IV Thrombolysis: not offered 2/2 no clear LKW. EVT: not offered,. No LVO   NIHSS components Score: Comment  1a Level of Conscious 0[x]  1[]  2[]  3[]      1b LOC Questions 0[]  1[]  2[x]       1c LOC Commands 0[]  1[x]  2[]       2 Best Gaze 0[x]  1[]  2[]       3 Visual 0[x]  1[]  2[]  3[]      4 Facial Palsy 0[x]  1[]  2[]  3[]      5a Motor Arm - left 0[x]  1[]  2[]  3[]  4[]  UN[]    5b Motor Arm - Right 0[x]  1[]  2[]  3[]  4[]  UN[]    6a Motor Leg - Left 0[x]  1[]  2[]  3[]  4[]  UN[]    6b Motor Leg - Right 0[x]  1[]  2[]  3[]  4[]  UN[]    7 Limb Ataxia 0[x]  1[]  2[]  3[]  UN[]     8 Sensory 0[x]  1[]  2[]  UN[]      9 Best Language 0[]  1[]  2[x]  3[]      10 Dysarthria 0[x]  1[]  2[]  UN[]      11 Extinct. and Inattention 0[x]  1[]  2[]       TOTAL: 5      ROS  Unable to ascertain due to aphasia.  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   GERD (gastroesophageal reflux disease)    History of hiatal hernia    HLD (hyperlipidemia)    Hypertension     Past Surgical History:  Procedure Laterality Date   ABDOMINAL HYSTERECTOMY     CERVICAL DISC SURGERY     COLONOSCOPY  2016   DILATION AND CURETTAGE OF UTERUS     ESOPHAGEAL MANOMETRY N/A 10/03/2013   Procedure: ESOPHAGEAL MANOMETRY (EM);  Surgeon: Beverley Fiedler, MD;  Location: [facility];  Service: Gastroenterology;  Laterality: N/A;   EYE SURGERY     bilateral cataracts    ORBITAL FRACTURE SURGERY     TOTAL KNEE ARTHROPLASTY Left 01/27/2019   Procedure: TOTAL KNEE ARTHROPLASTY;  Surgeon: Durene Romans, MD;  Location: [facility];  Service: Orthopedics;  Laterality: Left;  70 mins    Family History: Family  History  Problem Relation Age of Onset   Heart disease Mother    Cancer Mother        colon   Colon cancer Mother    Hypertension Father    Heart disease Father    Lung cancer Father    Cancer Father        lung   Stomach cancer Paternal Grandfather    Esophageal cancer Neg Hx    Rectal cancer Neg Hx    Breast cancer Neg Hx     Social History  reports that she has never smoked. She has never used smokeless tobacco. She reports current alcohol use of about 7.0 - 10.0 standard drinks of alcohol per week. She reports that she does not use drugs.  Allergies  Allergen Reactions   Morphine Other (See Comments)    Hypotension; had to be narcan'd . This was 40 years ago   Pantoprazole Cough    Medications  No current facility-administered medications for this encounter.  Current Outpatient Medications:    ALPRAZolam (XANAX) 0.25 MG tablet, Take 0.25 mg by mouth at bedtime as needed for anxiety., Disp: , Rfl:    atorvastatin (LIPITOR) 20 MG tablet, Take 20 mg by mouth 3  (three) times a week., Disp: , Rfl:    diclofenac (CATAFLAM) 50 MG tablet, Take 50 mg by mouth 2 (two) times daily., Disp: , Rfl:    docusate sodium (COLACE) 100 MG capsule, Take 100 mg by mouth daily. (Patient not taking: Reported on 10/30/2022), Disp: , Rfl:    hydrochlorothiazide (HYDRODIURIL) 25 MG tablet, Take 25 mg by mouth daily., Disp: , Rfl:    LINZESS 290 MCG CAPS capsule, Take 1 capsule (290 mcg total) by mouth daily before breakfast., Disp: 30 capsule, Rfl: 3   nebivolol (BYSTOLIC) 10 MG tablet, Take 10 mg by mouth daily., Disp: , Rfl:    traZODone (DESYREL) 50 MG tablet, Take 50 mg by mouth at bedtime as needed. (Patient not taking: Reported on 10/30/2022), Disp: , Rfl:   Vitals  There were no vitals filed for this visit.  There is no height or weight on file to calculate BMI.  Physical Exam   General: Laying comfortably in bed; in no acute distress.  HENT: Normal oropharynx and mucosa. Normal external appearance of ears and nose. Redness and swelling around R eye Neck: Supple, no pain or tenderness CV: No JVD. No peripheral edema.  Pulmonary: Symmetric Chest rise. Normal respiratory effort.  Abdomen: Soft to touch, non-tender.  Ext: No cyanosis, edema, or deformity  Skin: No rash. Normal palpation of skin.   Musculoskeletal: Normal digits and nails by inspection. No clubbing.   Neurologic Examination  Mental status/Cognition: Alert, oriented to self, unable to answer any orientation questions. Speech/language: Non fluent, get stuck on several words. Can follow a few 1 step commands but not all. Difficulty naming objects. Cranial nerves:   CN II Pupils equal and reactive to light, no VF deficits    CN III,IV,VI EOM intact, no gaze preference or deviation, no nystagmus    CN V normal sensation in V1, V2, and V3 segments bilaterally    CN VII no asymmetry, no nasolabial fold flattening    CN VIII normal hearing to speech    CN IX & X normal palatal elevation, no uvular  deviation    CN XI 5/5 head turn and 5/5 shoulder shrug bilaterally    CN XII midline tongue protrusion    Motor:  Muscle bulk: normal, tone  normal, pronator drift none tremor none Mvmt Root Nerve  Muscle Right Left Comments  SA C5/6 Ax Deltoid 5 5   EF C5/6 Mc Biceps 5 5   EE C6/7/8 Rad Triceps 5 5   WF C6/7 Med FCR     WE C7/8 PIN ECU     F Ab C8/T1 U ADM/FDI 5 5   HF L1/2/3 Fem Illopsoas 5 5   KE L2/3/4 Fem Quad 5 5   DF L4/5 D Peron Tib Ant 5 5   PF S1/2 Tibial Grc/Sol 5 5    Sensation:  Light touch Intact throughout   Pin prick    Temperature    Vibration   Proprioception    Coordination/Complex Motor:  - Finger to Nose intact BL - Heel to shin intact BL - Rapid alternating movement are normal - Gait: deferred/ Labs/Imaging/Neurodiagnostic studies   CBC: No results for input(s): "WBC", "NEUTROABS", "HGB", "HCT", "MCV", "PLT" in the last 168 hours.  Basic Metabolic Panel:  Lab Results  Component Value Date   NA 139 01/28/2019   K 4.5 01/28/2019   CO2 25 01/28/2019   GLUCOSE 155 (H) 01/28/2019   BUN 16 01/28/2019   CREATININE 0.83 01/28/2019   CALCIUM 8.3 (L) 01/28/2019   GFRNONAA >60 01/28/2019   GFRAA >60 01/28/2019    Lipid Panel: No results found for: "LDLCALC"  HgbA1c: No results found for: "HGBA1C"  Urine Drug Screen: No results found for: "LABOPIA", "COCAINSCRNUR", "LABBENZ", "AMPHETMU", "THCU", "LABBARB"   Alcohol Level No results found for: "ETH"  INR No results found for: "INR"  APTT No results found for: "APTT"  AED levels: No results found for: "PHENYTOIN", "ZONISAMIDE", "LAMOTRIGINE", "LEVETIRACETA"  CT Head without contrast(Personally reviewed): CTH was negative for a large hypodensity concerning for a large territory infarct or hyperdensity concerning for an ICH. Noted R orbital floor fracture.  CT angio Head and Neck with contrast(Personally reviewed): No LVO  MRI Brain(Personally reviewed): pending  Neurodiagnostics rEEG:   pending  Impression   Aeva Alsman is a 77 y.o. female presenting with fall and expressive greater than receptive aphasia. Not a candidate for tnakse due to unclear LKW. Not a candidate for thrombectomy 2/2 no LVO.  Etiology of her aphasia is unclear. Could be acute strove vs TBI/concussion vs epileptic aphasia.  Recommendations  - MRI Brain w/o contrast. - cEEG after MRI Brain. Can be discontinued if MRI explains her aphasia. - Keppra load while waiting on cEEG. - further evaluation pending MRI and cEEG. ______________________________________________________________________  This patient is critically ill and at significant risk of neurological worsening, death and care requires constant monitoring of vital signs, hemodynamics,respiratory and cardiac monitoring, neurological assessment, discussion with family, other specialists and medical decision making of high complexity. I spent 50 minutes of neurocritical care time  in the care of  this patient. This was time spent independent of any time provided by nurse practitioner or PA.  Erick Blinks [facility] 07/05/2023  6:42 AM  Signed,  Erick Blinks [facility]

## 2023-07-05 NOTE — Progress Notes (Signed)
Inpatient Rehab Admissions Coordinator:  ? ?Per therapy recommendations,  patient was screened for CIR candidacy by Devaney Segers, MS, CCC-SLP. At this time, Pt. Appears to be a a potential candidate for CIR. I will place   order for rehab consult per protocol for full assessment. Please contact me any with questions. ? ?Trine Fread, MS, CCC-SLP ?Rehab Admissions Coordinator  ?[phone] (celll) ?[phone] (office) ? ?

## 2023-07-05 NOTE — ED Triage Notes (Signed)
Pt arrived via [facility] s/p fall. Pts apple watch called ems. Ems found pt on porch down. EMS called code stroke. Pt noted to be saying inappropriate words only oriented to self

## 2023-07-05 NOTE — ED Notes (Signed)
Patient transported to MRI 

## 2023-07-05 NOTE — ED Notes (Addendum)
Called floor to let them know patient was coming up, the handoff was turned green at 1546 but then turned back yellow.  Charge confirmed they were ready.

## 2023-07-05 NOTE — ED Notes (Signed)
Daughter Irving Burton [phone] given update.  Son is on the way from [place] and should be here around 1000 today.

## 2023-07-05 NOTE — ED Provider Notes (Signed)
[place] [facility] DEPARTMENT AT [facility] Provider Note   CSN: [id number] Arrival date & time: 07/05/23  0400     History  No chief complaint on file.   Chloe Holmes is a 77 y.o. female.  Level 5 caveat for mental status change and acuity of condition.  Arrives from home as code stroke.  Uncertain last seen normal which was apparently greater than 24 hours ago.  Patient lives alone and apparently called EMS herself.  She was found on the back porch by EMS and confused.  They activated code stroke.  According the patient's Apple Watch she sustained a fall about an hour ago.  She has evidence of facial trauma and abrasions to her hands and feet.  On arrival she is aphasic and confused and states she is a Engineer, civil (consulting). She is unable to follow commands.  Past medical history includes hypertension, GERD, hyperlipidemia, no blood thinner use.  She has expressive aphasia and is unable to give a meaningful history.  She has abrasions to her nose, bilateral hands and feet.  The history is provided by the patient and the EMS personnel. The history is limited by the condition of the patient.       Home Medications Prior to Admission medications   Medication Sig Start Date End Date Taking? Authorizing Provider  ALPRAZolam Prudy Feeler) 0.25 MG tablet Take 0.25 mg by mouth at bedtime as needed for anxiety.    [provider]  atorvastatin (LIPITOR) 20 MG tablet Take 20 mg by mouth 3 (three) times a week.    [provider]  diclofenac (CATAFLAM) 50 MG tablet Take 50 mg by mouth 2 (two) times daily. 09/19/11   [provider]  docusate sodium (COLACE) 100 MG capsule Take 100 mg by mouth daily. Patient not taking: Reported on 10/30/2022    [provider]  hydrochlorothiazide (HYDRODIURIL) 25 MG tablet Take 25 mg by mouth daily.    [provider]  LINZESS 290 MCG CAPS capsule Take 1 capsule (290 mcg total) by mouth daily before breakfast.  03/31/23   Pyrtle, Carie Caddy, MD  nebivolol (BYSTOLIC) 10 MG tablet Take 10 mg by mouth daily.    [provider]  traZODone (DESYREL) 50 MG tablet Take 50 mg by mouth at bedtime as needed. Patient not taking: Reported on 10/30/2022 07/01/22   [provider]  esomeprazole (NEXIUM) 40 MG capsule Take 40 mg by mouth daily before breakfast.  10/31/11  [provider]      Allergies    Morphine and Pantoprazole    Review of Systems   Review of Systems  Unable to perform ROS: Mental status change    Physical Exam Updated Vital Signs Ht 5\' 4"  (1.626 m)   Wt 95 kg   BMI 35.96 kg/m  Physical Exam Vitals and nursing note reviewed.  Constitutional:      General: She is not in acute distress.    Appearance: She is well-developed.  HENT:     Head: Normocephalic and atraumatic.     Nose:     Comments: Dried blood to nares bilaterally Ecchymosis to right face and orbit.    Mouth/Throat:     Pharynx: No oropharyngeal exudate.  Eyes:     Conjunctiva/sclera: Conjunctivae normal.     Pupils: Pupils are equal, round, and reactive to light.  Neck:     Comments: No meningismus. Cardiovascular:     Rate and Rhythm: Normal rate and regular rhythm.  Heart sounds: Normal heart sounds. No murmur heard. Pulmonary:     Effort: Pulmonary effort is normal. No respiratory distress.     Breath sounds: Normal breath sounds.  Chest:     Chest wall: No tenderness.  Abdominal:     Palpations: Abdomen is soft.     Tenderness: There is no abdominal tenderness. There is no guarding or rebound.  Musculoskeletal:        General: No tenderness. Normal range of motion.     Cervical back: Normal range of motion and neck supple.     Comments: Multiple abrasions to bilateral hands and feet  Skin:    General: Skin is warm.  Neurological:     Mental Status: She is alert and oriented to person, place, and time.     Cranial Nerves: No cranial nerve deficit.     Motor: Weakness present. No  abnormal muscle tone.     Coordination: Coordination normal.     Comments: Expressive aphasia, difficulty following commands, oriented to person.  Right-sided facial droop.  Unable to protrude tongue. Able to lift arms and legs off the bed bilaterally.  No obvious pronator drift Word salad  Psychiatric:        Behavior: Behavior normal.     ED Results / Procedures / Treatments   Labs (all labs ordered are listed, but only abnormal results are displayed) Labs Reviewed  CBC - Abnormal; Notable for the following components:      Result Value   WBC 14.9 (*)    All other components within normal limits  DIFFERENTIAL - Abnormal; Notable for the following components:   Neutro Abs 13.1 (*)    Abs Immature Granulocytes 0.08 (*)    All other components within normal limits  COMPREHENSIVE METABOLIC PANEL - Abnormal; Notable for the following components:   Sodium 134 (*)    CO2 20 (*)    Glucose, Bld 133 (*)    BUN 31 (*)    All other components within normal limits  I-STAT CHEM 8, ED - Abnormal; Notable for the following components:   Sodium 133 (*)    BUN 31 (*)    Glucose, Bld 132 (*)    Calcium, Ion 1.04 (*)    TCO2 21 (*)    Hemoglobin 15.3 (*)    All other components within normal limits  ETHANOL  PROTIME-INR  APTT  HEMOGLOBIN A1C  RAPID URINE DRUG SCREEN, HOSP PERFORMED  URINALYSIS, ROUTINE W REFLEX MICROSCOPIC  LIPID PANEL  TSH  I-STAT CHEM 8, ED    EKG EKG Interpretation Date/Time:  Sunday July 05 2023 04:53:11 EST Ventricular Rate:  64 PR Interval:  161 QRS Duration:  114 QT Interval:  466 QTC Calculation: 481 R Axis:   71  Text Interpretation: Sinus rhythm Consider left atrial enlargement Borderline intraventricular conduction delay Borderline repolarization abnormality Artifact in lead(s) I III aVR aVL aVF V1 V2 Interpretation limited secondary to artifact Confirmed by Glynn Octave [phone]) on 07/05/2023 4:58:17 AM  Radiology CT CERVICAL SPINE WO  CONTRAST  Result Date: 07/05/2023 CLINICAL DATA:  77 year old female. Code stroke. Fall. Abnormal head CT. EXAM: CT CERVICAL SPINE WITHOUT CONTRAST TECHNIQUE: Multidetector CT imaging of the cervical spine was performed without intravenous contrast. Multiplanar CT image reconstructions were also generated. RADIATION DOSE REDUCTION: This exam was performed according to the departmental dose-optimization program which includes automated exposure control, adjustment of the mA and/or kV according to patient size and/or use of iterative reconstruction technique. COMPARISON:  CT  head and face today. CTA neck today reported separately. FINDINGS: Alignment: Straightening of lower cervical and mild reversal of upper cervical lordosis. Mild degenerative appearing anterolisthesis of C7 on T1. Bilateral posterior element alignment is within normal limits. Skull base and vertebrae: Visualized skull base is intact. No atlanto-occipital dissociation. C1 and C2 appear intact and aligned. No acute osseous abnormality identified. Soft tissues and spinal canal: No prevertebral fluid or swelling. No visible canal hematoma. Face reported separately. Negative noncontrast visible deep soft tissue spaces of the neck. Disc levels: Moderate to severe chronic cervical disc and endplate degeneration C3-C4 through C5-C6 superimposed on degenerative appearing C6-C7 ankylosis. Widespread superimposed facet arthropathy bilaterally, although generally worse on the right. There is evidence of developing facet ankylosis on the right at C3-C4. There is mild to moderate spinal stenosis at C4-C5, and moderate to severe spinal stenosis at C5-C6 (series 4, image 62). Upper chest: Minimally included, see Neck CTA reported separately. IMPRESSION: 1. No acute traumatic injury identified in the cervical spine. 2. 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 Multilevel spinal stenosis, up  to severe at C5-C6, with evidence of spinal cord mass effect. Electronically Signed   By: Odessa Fleming M.D.   On: 07/05/2023 05:11   CT Maxillofacial Wo Contrast  Result Date: 07/05/2023 CLINICAL DATA:  77 year old female. Code stroke. Fall. Abnormal head CT. EXAM: CT MAXILLOFACIAL WITHOUT CONTRAST TECHNIQUE: Multidetector CT imaging of the maxillofacial structures was performed. Multiplanar CT image reconstructions were also generated. RADIATION DOSE REDUCTION: This exam was performed according to the departmental dose-optimization program which includes automated exposure control, adjustment of the mA and/or kV according to patient size and/or use of iterative reconstruction technique. COMPARISON:  Head CT today reported separately. Prior face CT 10/10/2007. FINDINGS: Osseous: Severe left TMJ degeneration. Mandible intact and normally located. Right maxillary dentition periapical lucency but no complicating features. Left maxillary sinus walls are intact. Right maxillary sinus walls are intact but there is layering hemorrhage within the sinus. See orbit findings below. Bilateral zygoma and pterygoid bones appear intact. No acute nasal bone fracture. Central skull base intact. Cervical spine detailed separately. Orbits: Chronic left orbital floor fracture, improved compared to 2009. Acute right orbital floor fracture with comminution, herniated intraorbital fat (series 6, image 50) and marginal herniation of the inferior rectus muscle on image 51. There is mild contusion, fat stranding in the inferior right orbit. No intraorbital hematoma. Mild right lamina papyracea fracture also. Other orbital walls remain intact. Globes and other intraorbital soft tissues are within normal limits. There is additional right orbit preseptal, periorbital soft tissue hematoma/contusion which tracks to the forehead. Sinuses: Focal hemorrhage in a right ethmoid air cell and layering hemorrhage in the right maxillary sinus related to  the orbit fractures. Other paranasal sinuses are well aerated. Tympanic cavities and mastoids are clear. Soft tissues: Negative visible noncontrast thyroid, larynx, pharynx, parapharyngeal spaces, retropharyngeal space, sublingual space, submandibular spaces, masticator and parotid spaces are within normal limits. Right anterior and lateral face roughly 5 cm area of superficial hematoma/contusion. This overlies the right zygomaticomaxillary confluence which appears to remain intact. No soft tissue gas. Limited intracranial: Stable to that reported separately. IMPRESSION: 1. Acute Right Orbital floor and lamina papyracea fractures. Comminuted and displaced right floor fracture with herniated intraorbital fat, marginally herniated inferior rectus muscle. Minimal intraorbital contusion associated. Hemorrhage in the right maxillary sinus. 2. No other acute facial fracture identified. Additional right face and periorbital superficial hematoma/contusion. 3. Chronic left  orbital floor fracture. Severe left TMJ degeneration. Electronically Signed   By: Odessa Fleming M.D.   On: 07/05/2023 05:04   CT ANGIO HEAD NECK W WO CM (CODE STROKE)  Result Date: 07/05/2023 CLINICAL DATA:  77 year old female with aphasia.  Trauma. EXAM: CT ANGIOGRAPHY HEAD AND NECK CT PERFUSION BRAIN TECHNIQUE: Multidetector CT imaging of the head and neck was performed using the standard protocol during bolus administration of intravenous contrast. Multiplanar CT image reconstructions and MIPs were obtained to evaluate the vascular anatomy. Carotid stenosis measurements (when applicable) are obtained utilizing NASCET criteria, using the distal internal carotid diameter as the denominator. Multiphase CT imaging of the brain was performed following IV bolus contrast injection. Subsequent parametric perfusion maps were calculated using RAPID software. RADIATION DOSE REDUCTION: This exam was performed according to the departmental dose-optimization program  which includes automated exposure control, adjustment of the mA and/or kV according to patient size and/or use of iterative reconstruction technique. CONTRAST:  40mL OMNIPAQUE IOHEXOL 350 MG/ML SOLN; 75mL OMNIPAQUE IOHEXOL 350 MG/ML SOLN COMPARISON:  Head CT 0416 hours today. FINDINGS: CT Brain Perfusion Findings: ASPECTS: 10 CBF (<30%) Volume: 0mL.  All CBF in CBV parameters are negative. Perfusion (Tmax>6.0s) volume: 11mL.  Hypoperfusion index of 0 Mismatch Volume: Possibly 11mL Infarction Location:No core infarct detected, abnormal Tmax in the left PCA territory. See below. CTA NECK Skeleton: Face and cervical spine are detailed separately. Grossly intact visible upper thorax. Upper chest: Atelectasis. Mild retained gas and secretions in the thoracic esophagus which is not abnormally dilated. Visible central pulmonary arteries appear patent. Other neck: Superficial right face soft tissue injury reported separately. Aortic arch: Mild to moderate Calcified aortic atherosclerosis. Bovine arch configuration. Right carotid system: Tortuous brachiocephalic artery and right CCA origin with no significant plaque or stenosis. Significant right subclavian venous contrast streak artifact. Visible right CCA is normal. Minimal calcified plaque at the right carotid bifurcation. Retropharyngeal bifurcation and course of the right ICA with no significant plaque or stenosis to the skull base. Tortuous vessel below the skull base. Left carotid system: Bovine left CCA origin is tortuous with no significant plaque or stenosis. No significant plaque or stenosis at the bifurcation. Tortuous left ICA to the skull base without stenosis. Vertebral arteries: Tortuous proximal right subclavian artery and normal right vertebral artery origin with no plaque or stenosis. Right vertebral appears somewhat dominant, patent and within normal limits to the skull base. Proximal left subclavian mild plaque and tortuosity without stenosis. Left  vertebral artery origin is normal. Tortuous left V1 segment. Mildly non dominant left vertebral artery appears patent to the skull base with no significant plaque or stenosis. CTA HEAD Posterior circulation: Patent distal vertebral arteries, PICA origins, vertebrobasilar junction without plaque or stenosis. Mildly dominant right V4. Patent basilar artery without stenosis. Patent SCA and PCA origins. Both posterior communicating arteries are present. The right PCA branches are within normal limits. There is a severe short segment left PCA P1 stenosis best demonstrated on series 7, images 145 and 146. This is just proximal to the left posterior communicating artery junction. But beyond that the left PCA branches are within normal limits. Anterior circulation: Both ICA siphons are patent. No significant siphon plaque or stenosis. Mild calcified plaque is greater on the right. Both posterior communicating and ophthalmic artery origins appear normal. Patent carotid termini. Normal MCA and ACA origins. Anterior communicating artery is mildly ectatic on series 7, image 156 but there is no discrete aneurysm at this time. Bilateral ACA branches are patent  with mild irregularity, no significant stenosis. Right MCA M1 segment, right MCA trifurcation and right MCA branches are within normal limits. Left MCA M1 segment and trifurcation are patent without stenosis, mildly ectatic. Left MCA branches are within normal limits. No branch occlusion or stenosis identified. Venous sinuses: Early contrast timing, not evaluated. Anatomic variants: Mildly dominant right vertebral artery. Review of the MIP images confirms the above findings IMPRESSION: 1. 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 But no core infarct., abnormal CBF or CBV on CT Perfusion. This was discussed by telephone with Dr. Erick Blinks on 07/05/2023 at 0438 hours. 2. CTA is negative for  large vessel occlusion. Generalized vessel tortuosity in the head and neck with generally mild for age atherosclerosis. No other hemodynamically significant arterial stenosis. The anterior communicating artery is ectatic but without overt aneurysm. 3.  Aortic Atherosclerosis (ICD10-I70.0). Electronically Signed   By: Odessa Fleming M.D.   On: 07/05/2023 04:51   CT CEREBRAL PERFUSION W CONTRAST  Result Date: 07/05/2023 CLINICAL DATA:  77 year old female with aphasia.  Trauma. EXAM: CT ANGIOGRAPHY HEAD AND NECK CT PERFUSION BRAIN TECHNIQUE: Multidetector CT imaging of the head and neck was performed using the standard protocol during bolus administration of intravenous contrast. Multiplanar CT image reconstructions and MIPs were obtained to evaluate the vascular anatomy. Carotid stenosis measurements (when applicable) are obtained utilizing NASCET criteria, using the distal internal carotid diameter as the denominator. Multiphase CT imaging of the brain was performed following IV bolus contrast injection. Subsequent parametric perfusion maps were calculated using RAPID software. RADIATION DOSE REDUCTION: This exam was performed according to the departmental dose-optimization program which includes automated exposure control, adjustment of the mA and/or kV according to patient size and/or use of iterative reconstruction technique. CONTRAST:  40mL OMNIPAQUE IOHEXOL 350 MG/ML SOLN; 75mL OMNIPAQUE IOHEXOL 350 MG/ML SOLN COMPARISON:  Head CT 0416 hours today. FINDINGS: CT Brain Perfusion Findings: ASPECTS: 10 CBF (<30%) Volume: 0mL.  All CBF in CBV parameters are negative. Perfusion (Tmax>6.0s) volume: 11mL.  Hypoperfusion index of 0 Mismatch Volume: Possibly 11mL Infarction Location:No core infarct detected, abnormal Tmax in the left PCA territory. See below. CTA NECK Skeleton: Face and cervical spine are detailed separately. Grossly intact visible upper thorax. Upper chest: Atelectasis. Mild retained gas and secretions in  the thoracic esophagus which is not abnormally dilated. Visible central pulmonary arteries appear patent. Other neck: Superficial right face soft tissue injury reported separately. Aortic arch: Mild to moderate Calcified aortic atherosclerosis. Bovine arch configuration. Right carotid system: Tortuous brachiocephalic artery and right CCA origin with no significant plaque or stenosis. Significant right subclavian venous contrast streak artifact. Visible right CCA is normal. Minimal calcified plaque at the right carotid bifurcation. Retropharyngeal bifurcation and course of the right ICA with no significant plaque or stenosis to the skull base. Tortuous vessel below the skull base. Left carotid system: Bovine left CCA origin is tortuous with no significant plaque or stenosis. No significant plaque or stenosis at the bifurcation. Tortuous left ICA to the skull base without stenosis. Vertebral arteries: Tortuous proximal right subclavian artery and normal right vertebral artery origin with no plaque or stenosis. Right vertebral appears somewhat dominant, patent and within normal limits to the skull base. Proximal left subclavian mild plaque and tortuosity without stenosis. Left vertebral artery origin is normal. Tortuous left V1 segment. Mildly non dominant left vertebral artery appears patent to the skull base with no significant plaque or stenosis. CTA  HEAD Posterior circulation: Patent distal vertebral arteries, PICA origins, vertebrobasilar junction without plaque or stenosis. Mildly dominant right V4. Patent basilar artery without stenosis. Patent SCA and PCA origins. Both posterior communicating arteries are present. The right PCA branches are within normal limits. There is a severe short segment left PCA P1 stenosis best demonstrated on series 7, images 145 and 146. This is just proximal to the left posterior communicating artery junction. But beyond that the left PCA branches are within normal limits. Anterior  circulation: Both ICA siphons are patent. No significant siphon plaque or stenosis. Mild calcified plaque is greater on the right. Both posterior communicating and ophthalmic artery origins appear normal. Patent carotid termini. Normal MCA and ACA origins. Anterior communicating artery is mildly ectatic on series 7, image 156 but there is no discrete aneurysm at this time. Bilateral ACA branches are patent with mild irregularity, no significant stenosis. Right MCA M1 segment, right MCA trifurcation and right MCA branches are within normal limits. Left MCA M1 segment and trifurcation are patent without stenosis, mildly ectatic. Left MCA branches are within normal limits. No branch occlusion or stenosis identified. Venous sinuses: Early contrast timing, not evaluated. Anatomic variants: Mildly dominant right vertebral artery. Review of the MIP images confirms the above findings IMPRESSION: 1. 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 But no core infarct., abnormal CBF or CBV on CT Perfusion. This was discussed by telephone with Dr. Erick Blinks on 07/05/2023 at 0438 hours. 2. CTA is negative for large vessel occlusion. Generalized vessel tortuosity in the head and neck with generally mild for age atherosclerosis. No other hemodynamically significant arterial stenosis. The anterior communicating artery is ectatic but without overt aneurysm. 3.  Aortic Atherosclerosis (ICD10-I70.0). Electronically Signed   By: Odessa Fleming M.D.   On: 07/05/2023 04:51   CT HEAD CODE STROKE WO CONTRAST  Result Date: 07/05/2023 CLINICAL DATA:  Code stroke. 77 year old female (not otherwise specified at the time of this report). EXAM: CT HEAD WITHOUT CONTRAST TECHNIQUE: Contiguous axial images were obtained from the base of the skull through the vertex without intravenous contrast. RADIATION DOSE REDUCTION: This exam was performed according to the departmental  dose-optimization program which includes automated exposure control, adjustment of the mA and/or kV according to patient size and/or use of iterative reconstruction technique. COMPARISON:  Head CT 10/10/2007. FINDINGS: Brain: Cerebral volume is within normal limits for age. No midline shift, mass effect, or evidence of intracranial mass lesion. No ventriculomegaly. No acute intracranial hemorrhage identified. Patchy asymmetric frontal horn and anterior white matter capsule hypodensity appears chronic and stable. No cortically based acute infarct identified. Left basal ganglia perivascular space (normal variant). Vascular: Calcified atherosclerosis at the skull base. No suspicious intracranial vascular hyperdensity. Skull: Acute appearing right orbital floor fracture. Chronic left orbital floor fracture. Calvarium appears intact. Chronic left TMJ degeneration. Sinuses/Orbits: Layering hemorrhage in the right maxillary sinus. Right orbital floor fracture associated and new since 2009. Other paranasal sinuses well aerated. Tympanic cavities and mastoids well aerated. Other: Right forehead, periorbital, pre malar soft tissue injury with hematoma/contusion. No soft tissue gas identified. No gaze deviation. ASPECTS [facility] Stroke Program Early CT Score) Total score (0-10 with 10 being normal): 10 IMPRESSION: 1. No acute cortically based infarct or acute intracranial hemorrhage identified. ASPECTS 10. Chronic small vessel disease appears stable since 2009. 2. Acute traumatic findings including right orbital floor fracture, scalp and face soft tissue injury. Chronic left orbital floor  fracture. 3. These results were communicated to Dr. Derry Lory at 4:24 am on 07/05/2023 by text page via the [facility] messaging system. Electronically Signed   By: Odessa Fleming M.D.   On: 07/05/2023 04:24    Procedures .Critical Care  Performed by: Glynn Octave, MD Authorized by: Glynn Octave, MD   Critical care provider statement:     Critical care time (minutes):  45   Critical care time was exclusive of:  Separately billable procedures and treating other patients   Critical care was necessary to treat or prevent imminent or life-threatening deterioration of the following conditions:  CNS failure or compromise   Critical care was time spent personally by me on the following activities:  Development of treatment plan with patient or surrogate, discussions with consultants, evaluation of patient's response to treatment, examination of patient, ordering and review of laboratory studies, ordering and review of radiographic studies, ordering and performing treatments and interventions, pulse oximetry, re-evaluation of patient's condition, review of old charts, blood draw for specimens and obtaining history from patient or surrogate   I assumed direction of critical care for this patient from another provider in my specialty: no     Care discussed with: admitting provider       Medications Ordered in ED Medications - No data to display  ED Course/ Medical Decision Making/ A&P                                 Medical Decision Making Amount and/or Complexity of Data Reviewed Independent Historian: EMS Labs: ordered. Decision-making details documented in ED Course. Radiology: ordered and independent interpretation performed. Decision-making details documented in ED Course. ECG/medicine tests: ordered and independent interpretation performed. Decision-making details documented in ED Course.  Risk Prescription drug management. Decision regarding hospitalization.   Patient brought in by EMS as code stroke with aphasia and right-sided weakness.  Unclear last seen normal.  Did sustain a fall about an hour ago.  Seen as a code stroke on arrival with Dr. Derry Lory of neurology.   CT head negative for hemorrhage.  Does show a right orbital floor fracture.  She is out of the window for TNK.  Unclear last seen normal.  Will proceed  with CTA and CT perfusion studies.  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 Traumatic imaging positive for right sided orbital floor fracture. some herniation of orbital fat and inferior rectus muscle but does not appear to be entrapped clinically.    Labs reassuring.  Patient to be admitted for stroke workup including EEG and MRI.  She significantly aphasic but out of the window for TNK and has no large vessel occlusion on imaging per neurology Dr. Derry Lory.   Discussed with Dr. Elijah Birk of ENT regarding patient's orbital floor fracture.  There is concern for possible inferior rectus entrapment but difficult to tell clinically as she has difficulty following commands due to her aphasia. He will evaluate patient and recommends ophthalmology consultation as well.  D/w Dr. Wynelle Link of ophthalmology.  She will evaluate patient as well.  Admission d/w Dr. Julian Reil.         Final Clinical Impression(s) / ED Diagnoses Final diagnoses:  Cerebral infarction, unspecified mechanism (HCC)  Fall, initial encounter  Closed fracture of right orbital floor, initial encounter [facility])    Rx / DC Orders ED Discharge Orders     None  Glynn Octave, MD 07/05/23 [phone]

## 2023-07-05 NOTE — Consult Note (Signed)
Reason for Consult: Facial fractures Referring Physician: ER attending  Chloe Holmes is an 77 y.o. female.  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  She was diagnosed with a right orbital floor fracture and I was consulted for management recommendations.  I saw her this morning with her daughter and son-in-law in the room.  She was confused and complaining of some blurred vision but no diplopia, pain with eye movement, or difficulty moving her eyes.  Past Medical History:  Diagnosis Date   Arthritis    Diverticulosis    Dyspnea    At pre-op , pt  (a retired Insurance underwriter) reports past Hx of SOB that she found out was being caused by frequent PVCs, she believes the PVCs were related to her poor tolerance of daily statin medication regimen ; however reports resolution of SOB now that she is only taking statin three times a week and taking bystolic to manage dysrhythmia.   GERD (gastroesophageal reflux disease)    History of hiatal hernia    HLD (hyperlipidemia)    Hypertension     Past Surgical History:  Procedure Laterality Date   ABDOMINAL HYSTERECTOMY     CERVICAL DISC SURGERY     COLONOSCOPY  2016   DILATION AND CURETTAGE OF UTERUS     ESOPHAGEAL MANOMETRY N/A 10/03/2013   Procedure: ESOPHAGEAL MANOMETRY (EM);  Surgeon: Beverley Fiedler, MD;  Location: [facility];  Service: Gastroenterology;  Laterality: N/A;   EYE SURGERY     bilateral cataracts    ORBITAL FRACTURE SURGERY     TOTAL KNEE ARTHROPLASTY Left 01/27/2019   Procedure: TOTAL KNEE ARTHROPLASTY;  Surgeon: Durene Romans, MD;  Location: [facility];  Service: Orthopedics;  Laterality: Left;  70 mins    Family History  Problem Relation Age of Onset   Heart disease Mother    Cancer Mother        colon   Colon cancer Mother    Hypertension Father    Heart disease Father    Lung cancer Father    Cancer Father        lung   Stomach cancer  Paternal Grandfather    Esophageal cancer Neg Hx    Rectal cancer Neg Hx    Breast cancer Neg Hx     Social History:  reports that she has never smoked. She has never used smokeless tobacco. She reports current alcohol use of about 7.0 - 10.0 standard drinks of alcohol per week. She reports that she does not use drugs.  Allergies:  Allergies  Allergen Reactions   Morphine Other (See Comments)    Hypotension; had to be narcan'd . This was 40 years ago   Pantoprazole Cough    Medications: I have reviewed the patient's current medications.  Results for orders placed or performed during the hospital encounter of 07/05/23 (from the past 48 hour(s))  Ethanol     Status: None   Collection Time: 07/05/23  4:06 AM  Result Value Ref Range   Alcohol, Ethyl (B) <10 <10 mg/dL    Comment: (NOTE) Lowest detectable limit for serum alcohol is 10 mg/dL.  For medical purposes only. Performed at [facility], [address] [place], [place] [address]   Protime-INR     Status: None   Collection Time: 07/05/23  4:06 AM  Result Value Ref Range   Prothrombin Time 13.2 11.4 - 15.2  seconds   INR 1.0 0.8 - 1.2    Comment: (NOTE) INR goal varies based on device and disease states. Performed at [facility], [address], [place], [place] [address]   APTT     Status: None   Collection Time: 07/05/23  4:06 AM  Result Value Ref Range   aPTT 28 24 - 36 seconds    Comment: Performed at [facility], [address], [place], [place] [address]  CBC     Status: Abnormal   Collection Time: 07/05/23  4:06 AM  Result Value Ref Range   WBC 14.9 (H) 4.0 - 10.5 K/uL   RBC 4.54 3.87 - 5.11 MIL/uL   Hemoglobin 13.7 12.0 - 15.0 g/dL   HCT 95.1 88.4 - 16.6 %   MCV 92.3 80.0 - 100.0 fL   MCH 30.2 26.0 - 34.0 pg   MCHC 32.7 30.0 - 36.0 g/dL   RDW 06.3 01.6 - 01.0 %   Platelets 202 150 - 400 K/uL   nRBC 0.0 0.0 - 0.2 %    Comment: Performed at [facility], [address]  [place], [place] [address]  Differential     Status: Abnormal   Collection Time: 07/05/23  4:06 AM  Result Value Ref Range   Neutrophils Relative % 86 %   Neutro Abs 13.1 (H) 1.7 - 7.7 K/uL   Lymphocytes Relative 7 %   Lymphs Abs 1.1 0.7 - 4.0 K/uL   Monocytes Relative 4 %   Monocytes Absolute 0.6 0.1 - 1.0 K/uL   Eosinophils Relative 1 %   Eosinophils Absolute 0.1 0.0 - 0.5 K/uL   Basophils Relative 1 %   Basophils Absolute 0.1 0.0 - 0.1 K/uL   Immature Granulocytes 1 %   Abs Immature Granulocytes 0.08 (H) 0.00 - 0.07 K/uL    Comment: Performed at [facility], [address] [place], [place] [address]  Comprehensive metabolic panel     Status: Abnormal   Collection Time: 07/05/23  4:06 AM  Result Value Ref Range   Sodium 134 (L) 135 - 145 mmol/L   Potassium 3.6 3.5 - 5.1 mmol/L   Chloride 100 98 - 111 mmol/L   CO2 20 (L) 22 - 32 mmol/L   Glucose, Bld 133 (H) 70 - 99 mg/dL    Comment: Glucose reference range applies only to samples taken after fasting for at least 8 hours.   BUN 31 (H) 8 - 23 mg/dL   Creatinine, Ser 0.25 0.44 - 1.00 mg/dL   Calcium 9.6 8.9 - 42.7 mg/dL   Total Protein 7.2 6.5 - 8.1 g/dL   Albumin 4.2 3.5 - 5.0 g/dL   AST 26 15 - 41 U/L   ALT 21 0 - 44 U/L   Alkaline Phosphatase 58 38 - 126 U/L   Total Bilirubin 0.9 <1.2 mg/dL   GFR, Estimated >06 >23 mL/min    Comment: (NOTE) Calculated using the CKD-EPI Creatinine Equation (2021)    Anion gap 14 5 - 15    Comment: Performed at [facility], [address] [place], [place] [address]  Lipid panel     Status: None   Collection Time: 07/05/23  4:06 AM  Result Value Ref Range   Cholesterol 186 0 - 200 mg/dL   Triglycerides 75 <151 mg/dL   HDL 77 >76 mg/dL   Total CHOL/HDL Ratio 2.4 RATIO   VLDL 15 0 - 40 mg/dL   LDL  Cholesterol 94 0 - 99 mg/dL    Comment:        Total Cholesterol/HDL:CHD Risk Coronary Heart Disease Risk Table                     Men   Women  1/2 Average Risk   3.4   3.3   Average Risk       5.0   4.4  2 X Average Risk   9.6   7.1  3 X Average Risk  23.4   11.0        Use the calculated Patient Ratio above and the CHD Risk Table to determine the patient's CHD Risk.        ATP III CLASSIFICATION (LDL):  <100     mg/dL   Optimal  696-295  mg/dL   Near or Above                    Optimal  130-159  mg/dL   Borderline  284-132  mg/dL   High  >440     mg/dL   Very High Performed at [facility], [address] [place], [place] [address]   Hemoglobin A1c     Status: None   Collection Time: 07/05/23  4:06 AM  Result Value Ref Range   Hgb A1c MFr Bld 5.2 4.8 - 5.6 %    Comment: (NOTE) Pre diabetes:          5.7%-6.4%  Diabetes:              >6.4%  Glycemic control for   <7.0% adults with diabetes    Mean Plasma Glucose 102.54 mg/dL    Comment: Performed at [facility], [address], [place], [place] [address]  I-stat chem 8, ED     Status: Abnormal   Collection Time: 07/05/23  4:10 AM  Result Value Ref Range   Sodium 133 (L) 135 - 145 mmol/L   Potassium 3.5 3.5 - 5.1 mmol/L   Chloride 100 98 - 111 mmol/L   BUN 31 (H) 8 - 23 mg/dL   Creatinine, Ser 4.03 0.44 - 1.00 mg/dL   Glucose, Bld 474 (H) 70 - 99 mg/dL    Comment: Glucose reference range applies only to samples taken after fasting for at least 8 hours.   Calcium, Ion 1.04 (L) 1.15 - 1.40 mmol/L   TCO2 21 (L) 22 - 32 mmol/L   Hemoglobin 15.3 (H) 12.0 - 15.0 g/dL   HCT 25.9 56.3 - 87.5 %  TSH     Status: None   Collection Time: 07/05/23  7:06 AM  Result Value Ref Range   TSH 2.239 0.350 - 4.500 uIU/mL    Comment: Performed by a 3rd Generation assay with a functional sensitivity of <=0.01 uIU/mL. Performed at [facility], [address], [place], [place] [address]     MR BRAIN WO CONTRAST  Result Date: 07/05/2023 CLINICAL DATA:  77 year old female code stroke presentation this morning, trauma with orbital floor fracture. Left PCA stenosis on CTA. EXAM: MRI HEAD  WITHOUT CONTRAST TECHNIQUE: Multiplanar, multiecho pulse sequences of the brain and surrounding structures were obtained without intravenous contrast. COMPARISON:  Head CT, face CT, cervical spine CT, CTA and CTP this morning. FINDINGS: Brain: Somewhat noisy DWI.  No convincing diffusion restriction. Patchy and scattered bilateral cerebral white matter T2 and FLAIR hyperintensity in both hemispheres. Left lentiform perivascular space (normal variant). Deep gray matter  nuclei relatively spared. Minimal involvement of the pons. Cerebellum appears spared. No cortical encephalomalacia or chronic cerebral blood products identified. No midline shift, mass effect, evidence of mass lesion, ventriculomegaly, extra-axial collection or acute intracranial hemorrhage. Cervicomedullary junction and pituitary are within normal limits. Vascular: Major intracranial vascular flow voids are preserved. Skull and upper cervical spine: Facial fractures detailed by CT. Normal background bone marrow signal. Negative visible cervical spine for age. Sinuses/Orbits: Right orbit fracture and associated soft tissue injury as per CT. Layering hemorrhage in the right maxillary sinus. Other: Right face hematoma/contusion/edema. Mastoids remain clear. Grossly normal visible internal auditory structures. IMPRESSION: 1. No acute infarct or acute intracranial abnormality identified. Moderate for age cerebral white matter signal changes. 2. Acute traumatic findings about the right face and orbit as per CT today. Electronically Signed   By: Odessa Fleming M.D.   On: 07/05/2023 10:59   CT CERVICAL SPINE WO CONTRAST  Result Date: 07/05/2023 CLINICAL DATA:  77 year old female. Code stroke. Fall. Abnormal head CT. EXAM: CT CERVICAL SPINE WITHOUT CONTRAST TECHNIQUE: Multidetector CT imaging of the cervical spine was performed without intravenous contrast. Multiplanar CT image reconstructions were also generated. RADIATION DOSE REDUCTION: This exam was  performed according to the departmental dose-optimization program which includes automated exposure control, adjustment of the mA and/or kV according to patient size and/or use of iterative reconstruction technique. COMPARISON:  CT head and face today. CTA neck today reported separately. FINDINGS: Alignment: Straightening of lower cervical and mild reversal of upper cervical lordosis. Mild degenerative appearing anterolisthesis of C7 on T1. Bilateral posterior element alignment is within normal limits. Skull base and vertebrae: Visualized skull base is intact. No atlanto-occipital dissociation. C1 and C2 appear intact and aligned. No acute osseous abnormality identified. Soft tissues and spinal canal: No prevertebral fluid or swelling. No visible canal hematoma. Face reported separately. Negative noncontrast visible deep soft tissue spaces of the neck. Disc levels: Moderate to severe chronic cervical disc and endplate degeneration C3-C4 through C5-C6 superimposed on degenerative appearing C6-C7 ankylosis. Widespread superimposed facet arthropathy bilaterally, although generally worse on the right. There is evidence of developing facet ankylosis on the right at C3-C4. There is mild to moderate spinal stenosis at C4-C5, and moderate to severe spinal stenosis at C5-C6 (series 4, image 62). Upper chest: Minimally included, see Neck CTA reported separately. IMPRESSION: 1. No acute traumatic injury identified in the cervical spine. 2. 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 Multilevel spinal stenosis, up to severe at C5-C6, with evidence of spinal cord mass effect. Electronically Signed   By: Odessa Fleming M.D.   On: 07/05/2023 05:11   CT Maxillofacial Wo Contrast  Result Date: 07/05/2023 CLINICAL DATA:  77 year old female. Code stroke. Fall. Abnormal head CT. EXAM: CT MAXILLOFACIAL WITHOUT CONTRAST TECHNIQUE: Multidetector CT imaging of the  maxillofacial structures was performed. Multiplanar CT image reconstructions were also generated. RADIATION DOSE REDUCTION: This exam was performed according to the departmental dose-optimization program which includes automated exposure control, adjustment of the mA and/or kV according to patient size and/or use of iterative reconstruction technique. COMPARISON:  Head CT today reported separately. Prior face CT 10/10/2007. FINDINGS: Osseous: Severe left TMJ degeneration. Mandible intact and normally located. Right maxillary dentition periapical lucency but no complicating features. Left maxillary sinus walls are intact. Right maxillary sinus walls are intact but there is layering hemorrhage within the sinus. See orbit findings below. Bilateral zygoma and pterygoid bones appear intact. No acute nasal bone fracture. Central  skull base intact. Cervical spine detailed separately. Orbits: Chronic left orbital floor fracture, improved compared to 2009. Acute right orbital floor fracture with comminution, herniated intraorbital fat (series 6, image 50) and marginal herniation of the inferior rectus muscle on image 51. There is mild contusion, fat stranding in the inferior right orbit. No intraorbital hematoma. Mild right lamina papyracea fracture also. Other orbital walls remain intact. Globes and other intraorbital soft tissues are within normal limits. There is additional right orbit preseptal, periorbital soft tissue hematoma/contusion which tracks to the forehead. Sinuses: Focal hemorrhage in a right ethmoid air cell and layering hemorrhage in the right maxillary sinus related to the orbit fractures. Other paranasal sinuses are well aerated. Tympanic cavities and mastoids are clear. Soft tissues: Negative visible noncontrast thyroid, larynx, pharynx, parapharyngeal spaces, retropharyngeal space, sublingual space, submandibular spaces, masticator and parotid spaces are within normal limits. Right anterior and lateral  face roughly 5 cm area of superficial hematoma/contusion. This overlies the right zygomaticomaxillary confluence which appears to remain intact. No soft tissue gas. Limited intracranial: Stable to that reported separately. IMPRESSION: 1. Acute Right Orbital floor and lamina papyracea fractures. Comminuted and displaced right floor fracture with herniated intraorbital fat, marginally herniated inferior rectus muscle. Minimal intraorbital contusion associated. Hemorrhage in the right maxillary sinus. 2. No other acute facial fracture identified. Additional right face and periorbital superficial hematoma/contusion. 3. 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 Electronically Signed   By: Odessa Fleming M.D.   On: 07/05/2023 05:04   CT ANGIO HEAD NECK W WO CM (CODE STROKE)  Result Date: 07/05/2023 CLINICAL DATA:  77 year old female with aphasia.  Trauma. EXAM: CT ANGIOGRAPHY HEAD AND NECK CT PERFUSION BRAIN TECHNIQUE: Multidetector CT imaging of the head and neck was performed using the standard protocol during bolus administration of intravenous contrast. Multiplanar CT image reconstructions and MIPs were obtained to evaluate the vascular anatomy. Carotid stenosis measurements (when applicable) are obtained utilizing NASCET criteria, using the distal internal carotid diameter as the denominator. Multiphase CT imaging of the brain was performed following IV bolus contrast injection. Subsequent parametric perfusion maps were calculated using RAPID software. RADIATION DOSE REDUCTION: This exam was performed according to the departmental dose-optimization program which includes automated exposure control, adjustment of the mA and/or kV according to patient size and/or use of iterative reconstruction technique. CONTRAST:  40mL OMNIPAQUE IOHEXOL 350 MG/ML SOLN; 75mL OMNIPAQUE IOHEXOL 350 MG/ML SOLN COMPARISON:  Head CT 0416 hours today. FINDINGS: CT Brain Perfusion Findings: ASPECTS: 10 CBF (<30%)  Volume: 0mL.  All CBF in CBV parameters are negative. Perfusion (Tmax>6.0s) volume: 11mL.  Hypoperfusion index of 0 Mismatch Volume: Possibly 11mL Infarction Location:No core infarct detected, abnormal Tmax in the left PCA territory. See below. CTA NECK Skeleton: Face and cervical spine are detailed separately. Grossly intact visible upper thorax. Upper chest: Atelectasis. Mild retained gas and secretions in the thoracic esophagus which is not abnormally dilated. Visible central pulmonary arteries appear patent. Other neck: Superficial right face soft tissue injury reported separately. Aortic arch: Mild to moderate Calcified aortic atherosclerosis. Bovine arch configuration. Right carotid system: Tortuous brachiocephalic artery and right CCA origin with no significant plaque or stenosis. Significant right subclavian venous contrast streak artifact. Visible right CCA is normal. Minimal calcified plaque at the right carotid bifurcation. Retropharyngeal bifurcation and course of the right ICA with no significant plaque or stenosis to the skull base. Tortuous vessel below the skull base. Left carotid system: Bovine left CCA origin is tortuous with no significant plaque  or stenosis. No significant plaque or stenosis at the bifurcation. Tortuous left ICA to the skull base without stenosis. Vertebral arteries: Tortuous proximal right subclavian artery and normal right vertebral artery origin with no plaque or stenosis. Right vertebral appears somewhat dominant, patent and within normal limits to the skull base. Proximal left subclavian mild plaque and tortuosity without stenosis. Left vertebral artery origin is normal. Tortuous left V1 segment. Mildly non dominant left vertebral artery appears patent to the skull base with no significant plaque or stenosis. CTA HEAD Posterior circulation: Patent distal vertebral arteries, PICA origins, vertebrobasilar junction without plaque or stenosis. Mildly dominant right V4. Patent  basilar artery without stenosis. Patent SCA and PCA origins. Both posterior communicating arteries are present. The right PCA branches are within normal limits. There is a severe short segment left PCA P1 stenosis best demonstrated on series 7, images 145 and 146. This is just proximal to the left posterior communicating artery junction. But beyond that the left PCA branches are within normal limits. Anterior circulation: Both ICA siphons are patent. No significant siphon plaque or stenosis. Mild calcified plaque is greater on the right. Both posterior communicating and ophthalmic artery origins appear normal. Patent carotid termini. Normal MCA and ACA origins. Anterior communicating artery is mildly ectatic on series 7, image 156 but there is no discrete aneurysm at this time. Bilateral ACA branches are patent with mild irregularity, no significant stenosis. Right MCA M1 segment, right MCA trifurcation and right MCA branches are within normal limits. Left MCA M1 segment and trifurcation are patent without stenosis, mildly ectatic. Left MCA branches are within normal limits. No branch occlusion or stenosis identified. Venous sinuses: Early contrast timing, not evaluated. Anatomic variants: Mildly dominant right vertebral artery. Review of the MIP images confirms the above findings IMPRESSION: 1. 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 But no core infarct., abnormal CBF or CBV on CT Perfusion. This was discussed by telephone with Dr. Erick Blinks on 07/05/2023 at 0438 hours. 2. CTA is negative for large vessel occlusion. Generalized vessel tortuosity in the head and neck with generally mild for age atherosclerosis. No other hemodynamically significant arterial stenosis. The anterior communicating artery is ectatic but without overt aneurysm. 3.  Aortic Atherosclerosis (ICD10-I70.0). Electronically Signed   By: Odessa Fleming M.D.   On:  07/05/2023 04:51   CT CEREBRAL PERFUSION W CONTRAST  Result Date: 07/05/2023 CLINICAL DATA:  77 year old female with aphasia.  Trauma. EXAM: CT ANGIOGRAPHY HEAD AND NECK CT PERFUSION BRAIN TECHNIQUE: Multidetector CT imaging of the head and neck was performed using the standard protocol during bolus administration of intravenous contrast. Multiplanar CT image reconstructions and MIPs were obtained to evaluate the vascular anatomy. Carotid stenosis measurements (when applicable) are obtained utilizing NASCET criteria, using the distal internal carotid diameter as the denominator. Multiphase CT imaging of the brain was performed following IV bolus contrast injection. Subsequent parametric perfusion maps were calculated using RAPID software. RADIATION DOSE REDUCTION: This exam was performed according to the departmental dose-optimization program which includes automated exposure control, adjustment of the mA and/or kV according to patient size and/or use of iterative reconstruction technique. CONTRAST:  40mL OMNIPAQUE IOHEXOL 350 MG/ML SOLN; 75mL OMNIPAQUE IOHEXOL 350 MG/ML SOLN COMPARISON:  Head CT 0416 hours today. FINDINGS: CT Brain Perfusion Findings: ASPECTS: 10 CBF (<30%) Volume: 0mL.  All CBF in CBV parameters are negative. Perfusion (Tmax>6.0s) volume: 11mL.  Hypoperfusion index of 0 Mismatch Volume: Possibly  11mL Infarction Location:No core infarct detected, abnormal Tmax in the left PCA territory. See below. CTA NECK Skeleton: Face and cervical spine are detailed separately. Grossly intact visible upper thorax. Upper chest: Atelectasis. Mild retained gas and secretions in the thoracic esophagus which is not abnormally dilated. Visible central pulmonary arteries appear patent. Other neck: Superficial right face soft tissue injury reported separately. Aortic arch: Mild to moderate Calcified aortic atherosclerosis. Bovine arch configuration. Right carotid system: Tortuous brachiocephalic artery and right  CCA origin with no significant plaque or stenosis. Significant right subclavian venous contrast streak artifact. Visible right CCA is normal. Minimal calcified plaque at the right carotid bifurcation. Retropharyngeal bifurcation and course of the right ICA with no significant plaque or stenosis to the skull base. Tortuous vessel below the skull base. Left carotid system: Bovine left CCA origin is tortuous with no significant plaque or stenosis. No significant plaque or stenosis at the bifurcation. Tortuous left ICA to the skull base without stenosis. Vertebral arteries: Tortuous proximal right subclavian artery and normal right vertebral artery origin with no plaque or stenosis. Right vertebral appears somewhat dominant, patent and within normal limits to the skull base. Proximal left subclavian mild plaque and tortuosity without stenosis. Left vertebral artery origin is normal. Tortuous left V1 segment. Mildly non dominant left vertebral artery appears patent to the skull base with no significant plaque or stenosis. CTA HEAD Posterior circulation: Patent distal vertebral arteries, PICA origins, vertebrobasilar junction without plaque or stenosis. Mildly dominant right V4. Patent basilar artery without stenosis. Patent SCA and PCA origins. Both posterior communicating arteries are present. The right PCA branches are within normal limits. There is a severe short segment left PCA P1 stenosis best demonstrated on series 7, images 145 and 146. This is just proximal to the left posterior communicating artery junction. But beyond that the left PCA branches are within normal limits. Anterior circulation: Both ICA siphons are patent. No significant siphon plaque or stenosis. Mild calcified plaque is greater on the right. Both posterior communicating and ophthalmic artery origins appear normal. Patent carotid termini. Normal MCA and ACA origins. Anterior communicating artery is mildly ectatic on series 7, image 156 but there  is no discrete aneurysm at this time. Bilateral ACA branches are patent with mild irregularity, no significant stenosis. Right MCA M1 segment, right MCA trifurcation and right MCA branches are within normal limits. Left MCA M1 segment and trifurcation are patent without stenosis, mildly ectatic. Left MCA branches are within normal limits. No branch occlusion or stenosis identified. Venous sinuses: Early contrast timing, not evaluated. Anatomic variants: Mildly dominant right vertebral artery. Review of the MIP images confirms the above findings IMPRESSION: 1. 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 But no core infarct., abnormal CBF or CBV on CT Perfusion. This was discussed by telephone with Dr. Erick Blinks on 07/05/2023 at 0438 hours. 2. CTA is negative for large vessel occlusion. Generalized vessel tortuosity in the head and neck with generally mild for age atherosclerosis. No other hemodynamically significant arterial stenosis. The anterior communicating artery is ectatic but without overt aneurysm. 3.  Aortic Atherosclerosis (ICD10-I70.0). Electronically Signed   By: Odessa Fleming M.D.   On: 07/05/2023 04:51   CT HEAD CODE STROKE WO CONTRAST  Result Date: 07/05/2023 CLINICAL DATA:  Code stroke. 77 year old female (not otherwise specified at the time of this report). EXAM: CT HEAD WITHOUT CONTRAST TECHNIQUE: Contiguous axial images were obtained from the base of  the skull through the vertex without intravenous contrast. RADIATION DOSE REDUCTION: This exam was performed according to the departmental dose-optimization program which includes automated exposure control, adjustment of the mA and/or kV according to patient size and/or use of iterative reconstruction technique. COMPARISON:  Head CT 10/10/2007. FINDINGS: Brain: Cerebral volume is within normal limits for age. No midline shift, mass effect, or evidence of intracranial mass  lesion. No ventriculomegaly. No acute intracranial hemorrhage identified. Patchy asymmetric frontal horn and anterior white matter capsule hypodensity appears chronic and stable. No cortically based acute infarct identified. Left basal ganglia perivascular space (normal variant). Vascular: Calcified atherosclerosis at the skull base. No suspicious intracranial vascular hyperdensity. Skull: Acute appearing right orbital floor fracture. Chronic left orbital floor fracture. Calvarium appears intact. Chronic left TMJ degeneration. Sinuses/Orbits: Layering hemorrhage in the right maxillary sinus. Right orbital floor fracture associated and new since 2009. Other paranasal sinuses well aerated. Tympanic cavities and mastoids well aerated. Other: Right forehead, periorbital, pre malar soft tissue injury with hematoma/contusion. No soft tissue gas identified. No gaze deviation. ASPECTS [facility] Stroke Program Early CT Score) Total score (0-10 with 10 being normal): 10 IMPRESSION: 1. No acute cortically based infarct or acute intracranial hemorrhage identified. ASPECTS 10. Chronic small vessel disease appears stable since 2009. 2. Acute traumatic findings including right orbital floor fracture, scalp and face soft tissue injury. Chronic left orbital floor fracture. 3. These results were communicated to Dr. Derry Lory at 4:24 am on 07/05/2023 by text page via the [facility] messaging system. Electronically Signed   By: Odessa Fleming M.D.   On: 07/05/2023 04:24    Review of Systems Blood pressure (!) 164/90, pulse 71, temperature 98 F (36.7 C), temperature source Oral, resp. rate 20, height 5\' 4"  (1.626 m), weight 95 kg, SpO2 100%.  Physical Exam  Patient is awake, cooperative, but a bit confused. Pupils equal round and reactive to light/extraocular movements normal/no nystagmus/visual acuity grossly normal without diplopia Right periorbital contusion present Facial bones stable without bony step-offs or evidence of  fracture No malocclusion Pinna normal without mastoid ecchymosis or tenderness External nose unremarkable.  No septal hematoma No cervical air or crepitance No neck mass or adenopathy Normal lips and tongue mobility Cranial nerves II through XII intact without focal motor deficit; confused with difficulty finding her words  CT scan - I reviewed the CT scan report and films.  Patient has a small right orbital floor fracture.  There is minimal prolapse of periorbital fat into the sinus.  The medial rectus abuts the fracture but does not appear entrapped radiologically.  Assessment/Plan:  Right orbital floor fracture  There is no clinical or radiological evidence of entrapment.  The patient complains of no diplopia.  Given her other issues and those findings, I would not recommend intervention at this time.  If entrapment occurs or diplopia develops, repair could be considered in 5 to 7 days.  Reconsult as needed.  Rejeana Brock Jr. 07/05/2023, 1:19 PM

## 2023-07-05 NOTE — ED Notes (Signed)
Returned from MRI. Tech at bedside to begin EEG.

## 2023-07-05 NOTE — Progress Notes (Signed)
Received from ED via stretcher; patient is alert to self only presently; repeating the same word over and over; moving all over the bed; c/o headache reported to MD; orders received; son and daughter-in-law at bedside; fall risk precautions reviewed; bed low locked and alarmed. Difficult to obtain the admission hx; family supportive.

## 2023-07-05 NOTE — Evaluation (Signed)
Physical Therapy Evaluation Patient Details Name: Chloe Holmes MRN: [id number] DOB: October 05, 1945 Today's Date: 07/05/2023  History of Present Illness  Pt is a 77 y.o. female who presented 07/05/23 s/p fall with AMS and aphasia. CT head negative for hemorrhage. Does show a right orbital floor fracture. She is out of the window for TNK. 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 Pt being worked up for acute stroke vs TBI/concussion vs epileptic aphasia. PMH: GERD, HTN, HLD   Clinical Impression  Pt presents with condition above and deficits mentioned below, see PT Problem List. Pt demonstrates deficits in cognition and communication and thus may be a poor historian. Pt reports she was independent without DME and was living alone in a 1-level house with 1 STE. Currently, pt is displaying deficits in balance, activity tolerance, and gross strength in addition to cognition and communication. Pt did endorse vision deficits out of her R eye (was visibly swollen from injury) and did endorse double vision when asked. She tends to veer to the L but bump into obstacles on her R when ambulating and needs repeated max multi-modal cues to identify and fix the issue. She also needs min-modA for balance and guidance to avoid obstacles when ambulating with a RW at this time. She is at high risk for falls and injury. Pt could greatly benefit from intensive inpatient rehab, > 3 hours/day. Will continue to follow acutely.       If plan is discharge home, recommend the following: A lot of help with walking and/or transfers;A lot of help with bathing/dressing/bathroom;Assistance with cooking/housework;Direct supervision/assist for medications management;Direct supervision/assist for financial management;Assist for transportation;Help with stairs or ramp for entrance   Can travel by private vehicle        Equipment Recommendations Rolling walker (2 wheels);BSC/3in1  Recommendations  for Other Services  Rehab consult    Functional Status Assessment Patient has had a recent decline in their functional status and demonstrates the ability to make significant improvements in function in a reasonable and predictable amount of time.     Precautions / Restrictions Precautions Precautions: Fall Precaution Comments: questionable R vision/inattention? Restrictions Weight Bearing Restrictions: No      Mobility  Bed Mobility Overal bed mobility: Needs Assistance Bed Mobility: Supine to Sit, Sit to Supine     Supine to sit: Contact guard, HOB elevated Sit to supine: Contact guard assist, HOB elevated   General bed mobility comments: Extra time, CGA for safety    Transfers Overall transfer level: Needs assistance Equipment used: Rolling walker (2 wheels) Transfers: Sit to/from Stand Sit to Stand: Min assist           General transfer comment: Pt able to transfer to stand with CGA for safety, needing cues for hand placement. However, pt needed minA and verbal cues to safely line herself up with the sitting surface prior to transferring stand to sit as she would often go too far to the R of the sitting surface.    Ambulation/Gait Ambulation/Gait assistance: Min assist, Mod assist Gait Distance (Feet): 45 Feet (x2 bouts of ~45 ft each bout) Assistive device: Rolling walker (2 wheels) Gait Pattern/deviations: Step-through pattern, Decreased step length - right, Decreased step length - left, Drifts right/left, Trunk flexed Gait velocity: reduced Gait velocity interpretation: <1.31 ft/sec, indicative of household ambulator   General Gait Details: Pt tends to veer to the L and bump into obstacles on her R. She needs cues to remain proximal to her  RW and keep her R hand on the R grip. She also needs cues to try to push the RW equally with bil UEs to reduce her veering. She needed repeated cues to visually attend to her RW legs to identify what she was getting stuck on  and cues to problem-solve to get unstuck or go around obstacles. Pt required min-modA for balance and directing her around obstacles.  Stairs            Wheelchair Mobility     Tilt Bed    Modified Rankin (Stroke Patients Only) Modified Rankin (Stroke Patients Only) Pre-Morbid Rankin Score: No symptoms Modified Rankin: Moderately severe disability     Balance Overall balance assessment: Needs assistance Sitting-balance support: No upper extremity supported, Feet supported Sitting balance-Leahy Scale: Fair Sitting balance - Comments: Unable to reach to feet to donn socks when cued, supervision for static sitting balance EOB   Standing balance support: Bilateral upper extremity supported, During functional activity, Reliant on assistive device for balance Standing balance-Leahy Scale: Poor Standing balance comment: Reliant on RW and min-modA                             Pertinent Vitals/Pain Pain Assessment Pain Assessment: Faces Faces Pain Scale: No hurt Pain Intervention(s): Monitored during session    Home Living Family/patient expects to be discharged to:: Private residence Living Arrangements: Alone   Type of Home: House Home Access: Stairs to enter   Secretary/administrator of Steps: 1   Home Layout: One level   Additional Comments: may need to confirm info due to aphasia    Prior Function Prior Level of Function : Independent/Modified Independent;Driving;Patient poor historian/Family not available             Mobility Comments: Was not using an AD       Extremity/Trunk Assessment   Upper Extremity Assessment Upper Extremity Assessment: Defer to OT evaluation    Lower Extremity Assessment Lower Extremity Assessment: Generalized weakness (pt had difficulty fully following MMT cues but seemed fairly symmetrical in strength; denied numbness/tingling bil)    Cervical / Trunk Assessment Cervical / Trunk Assessment: Normal   Communication   Communication Communication: Difficulty following commands/understanding;Difficulty communicating thoughts/reduced clarity of speech Following commands: Follows multi-step commands inconsistently;Follows one step commands consistently;Follows one step commands with increased time Cueing Techniques: Verbal cues;Tactile cues;Gestural cues;Visual cues  Cognition Arousal: Alert Behavior During Therapy: Impulsive (mildly) Overall Cognitive Status: Impaired/Different from baseline Area of Impairment: Attention, Memory, Following commands, Safety/judgement, Awareness, Problem solving                   Current Attention Level: Sustained Memory: Decreased short-term memory, Decreased recall of precautions Following Commands: Follows one step commands consistently, Follows one step commands with increased time, Follows multi-step commands inconsistently Safety/Judgement: Decreased awareness of safety, Decreased awareness of deficits Awareness: Emergent Problem Solving: Slow processing, Difficulty sequencing, Requires verbal cues, Requires tactile cues, Decreased initiation General Comments: Pt displays deficits in communication, having difficulty getting words out often. Pt perseverating on various words, like "light". She did repeatedly complain of vision issues, endorsing double vision when asked but difficult to tell if pt had a visual cut as well. Poor awareness of her vision or inattention resulting in her veering L and bumping into obstacles on her R, needing repeated multi-modal cues to look at both legs of RW to identify what she was stuck on and to problem-solve how to go  around the obstacle.        General Comments General comments (skin integrity, edema, etc.): pt reported having double vision but also repeatedly complained of vision issues out of her R eye as if she could not see out of her R eye, R eye was noted to be swollen from injury though    Exercises      Assessment/Plan    PT Assessment Patient needs continued PT services  PT Problem List Decreased strength;Decreased activity tolerance;Decreased balance;Decreased mobility;Decreased cognition;Decreased knowledge of use of DME;Decreased safety awareness       PT Treatment Interventions DME instruction;Gait training;Stair training;Functional mobility training;Therapeutic activities;Therapeutic exercise;Balance training;Neuromuscular re-education;Cognitive remediation;Patient/family education    PT Goals (Current goals can be found in the Care Plan section)  Acute Rehab PT Goals Patient Stated Goal: to improve PT Goal Formulation: With patient Time For Goal Achievement: 07/19/23 Potential to Achieve Goals: Good    Frequency Min 1X/week     Co-evaluation               AM-PAC PT "6 Clicks" Mobility  Outcome Measure Help needed turning from your back to your side while in a flat bed without using bedrails?: A Little Help needed moving from lying on your back to sitting on the side of a flat bed without using bedrails?: A Little Help needed moving to and from a bed to a chair (including a wheelchair)?: A Little Help needed standing up from a chair using your arms (e.g., wheelchair or bedside chair)?: A Little Help needed to walk in hospital room?: A Lot Help needed climbing 3-5 steps with a railing? : Total 6 Click Score: 15    End of Session Equipment Utilized During Treatment: Gait belt Activity Tolerance: Patient tolerated treatment well Patient left: in bed;with call bell/phone within reach Nurse Communication: Mobility status;Other (comment) (vision deficits) PT Visit Diagnosis: Unsteadiness on feet (R26.81);Other abnormalities of gait and mobility (R26.89);Difficulty in walking, not elsewhere classified (R26.2);Other symptoms and signs involving the nervous system (R29.898);Muscle weakness (generalized) (M62.81)    Time: 1610-9604 PT Time Calculation (min) (ACUTE  ONLY): 23 min   Charges:   PT Evaluation $PT Eval Moderate Complexity: 1 Mod PT Treatments $Therapeutic Activity: 8-22 mins PT General Charges $$ ACUTE PT VISIT: 1 Visit         Virgil Benedict, PT, DPT Acute Rehabilitation Services  Office: [phone]   Bettina Gavia 07/05/2023, 10:26 AM

## 2023-07-05 NOTE — ED Notes (Signed)
Ambulatory to bathroom with PT. Patient did void in toilet, but urine specimen not collected. Will attempt later

## 2023-07-05 NOTE — ED Notes (Signed)
ED TO INPATIENT HANDOFF REPORT  ED Nurse Name and Phone #:  Alannie Amodio [phone]  S Name/Age/Gender Chloe Holmes 77 y.o. female Room/Bed: 010C/010C  Code Status   Code Status: Full Code  Home/SNF/Other Home Patient oriented to: self and situation Is this baseline? No   Triage Complete: Triage complete  Chief Complaint CVA (cerebral vascular accident) [facility]) [I63.9]  Triage Note Pt arrived via [facility] s/p fall. Pts apple watch called ems. Ems found pt on porch down. EMS called code stroke. Pt noted to be saying inappropriate words only oriented to self   Allergies Allergies  Allergen Reactions   Morphine Other (See Comments)    Hypotension; had to be narcan'd . This was 40 years ago   Pantoprazole Cough    Level of Care/Admitting Diagnosis ED Disposition     ED Disposition  Admit   Condition  --   Comment  Hospital Area: [facility] [[id number]]  Level of Care: Telemetry Medical [104]  May admit patient to [facility] or [facility] if equivalent level of care is available:: No  Covid Evaluation: Asymptomatic - no recent exposure (last 10 days) testing not required  Diagnosis: CVA (cerebral vascular accident) [facility]) [[id number]]  Admitting Physician: Clydie Braun [[id number]]  Attending Physician: Clydie Braun [[id number]]  Certification:: I certify this patient will need inpatient services for at least 2 midnights  Expected Medical Readiness: 07/07/2023          B Medical/Surgery History Past Medical History:  Diagnosis Date   Arthritis    Diverticulosis    Dyspnea    At pre-op , pt  (a retired Insurance underwriter) reports past Hx of SOB that she found out was being caused by frequent PVCs, she believes the PVCs were related to her poor tolerance of daily statin medication regimen ; however reports resolution of SOB now that she is only taking statin three times a week and taking bystolic to manage dysrhythmia.   GERD (gastroesophageal reflux disease)     History of hiatal hernia    HLD (hyperlipidemia)    Hypertension    Past Surgical History:  Procedure Laterality Date   ABDOMINAL HYSTERECTOMY     CERVICAL DISC SURGERY     COLONOSCOPY  2016   DILATION AND CURETTAGE OF UTERUS     ESOPHAGEAL MANOMETRY N/A 10/03/2013   Procedure: ESOPHAGEAL MANOMETRY (EM);  Surgeon: Beverley Fiedler, MD;  Location: [facility] ENDOSCOPY;  Service: Gastroenterology;  Laterality: N/A;   EYE SURGERY     bilateral cataracts    ORBITAL FRACTURE SURGERY     TOTAL KNEE ARTHROPLASTY Left 01/27/2019   Procedure: TOTAL KNEE ARTHROPLASTY;  Surgeon: Durene Romans, MD;  Location: [facility] ORS;  Service: Orthopedics;  Laterality: Left;  70 mins     A IV Location/Drains/Wounds Patient Lines/Drains/Airways Status     Active Line/Drains/Airways     Name Placement date Placement time Site Days   Peripheral IV 07/05/23 20 G Anterior;Distal;Right Forearm 07/05/23  0444  Forearm  less than 1   Peripheral IV 07/05/23 18 G Left Antecubital 07/05/23  0445  Antecubital  less than 1   External Urinary Catheter 07/05/23  0547  --  less than 1            Intake/Output Last 24 hours  Intake/Output Summary (Last 24 hours) at 07/05/2023 1429 Last data filed at 07/05/2023 0903 Gross per 24 hour  Intake 116.73 ml  Output --  Net 116.73 ml    Labs/Imaging  Results for orders placed or performed during the hospital encounter of 07/05/23 (from the past 48 hour(s))  Ethanol     Status: None   Collection Time: 07/05/23  4:06 AM  Result Value Ref Range   Alcohol, Ethyl (B) <10 <10 mg/dL    Comment: (NOTE) Lowest detectable limit for serum alcohol is 10 mg/dL.  For medical purposes only. Performed at [facility], [address], [place], [place] [address]   Protime-INR     Status: None   Collection Time: 07/05/23  4:06 AM  Result Value Ref Range   Prothrombin Time 13.2 11.4 - 15.2 seconds   INR 1.0 0.8 - 1.2    Comment: (NOTE) INR goal varies based on device and disease  states. Performed at [facility], [address], [place], [place] [address]   APTT     Status: None   Collection Time: 07/05/23  4:06 AM  Result Value Ref Range   aPTT 28 24 - 36 seconds    Comment: Performed at [facility], [address], [place], [place] [address]  CBC     Status: Abnormal   Collection Time: 07/05/23  4:06 AM  Result Value Ref Range   WBC 14.9 (H) 4.0 - 10.5 K/uL   RBC 4.54 3.87 - 5.11 MIL/uL   Hemoglobin 13.7 12.0 - 15.0 g/dL   HCT 91.4 78.2 - 95.6 %   MCV 92.3 80.0 - 100.0 fL   MCH 30.2 26.0 - 34.0 pg   MCHC 32.7 30.0 - 36.0 g/dL   RDW 21.3 08.6 - 57.8 %   Platelets 202 150 - 400 K/uL   nRBC 0.0 0.0 - 0.2 %    Comment: Performed at [facility], [address], [place], [place] [address]  Differential     Status: Abnormal   Collection Time: 07/05/23  4:06 AM  Result Value Ref Range   Neutrophils Relative % 86 %   Neutro Abs 13.1 (H) 1.7 - 7.7 K/uL   Lymphocytes Relative 7 %   Lymphs Abs 1.1 0.7 - 4.0 K/uL   Monocytes Relative 4 %   Monocytes Absolute 0.6 0.1 - 1.0 K/uL   Eosinophils Relative 1 %   Eosinophils Absolute 0.1 0.0 - 0.5 K/uL   Basophils Relative 1 %   Basophils Absolute 0.1 0.0 - 0.1 K/uL   Immature Granulocytes 1 %   Abs Immature Granulocytes 0.08 (H) 0.00 - 0.07 K/uL    Comment: Performed at [facility], [address] [place], [place] [address]  Comprehensive metabolic panel     Status: Abnormal   Collection Time: 07/05/23  4:06 AM  Result Value Ref Range   Sodium 134 (L) 135 - 145 mmol/L   Potassium 3.6 3.5 - 5.1 mmol/L   Chloride 100 98 - 111 mmol/L   CO2 20 (L) 22 - 32 mmol/L   Glucose, Bld 133 (H) 70 - 99 mg/dL    Comment: Glucose reference range applies only to samples taken after fasting for at least 8 hours.   BUN 31 (H) 8 - 23 mg/dL   Creatinine, Ser 1.32 0.44 - 1.00 mg/dL   Calcium 9.6 8.9 - 44.0 mg/dL   Total Protein 7.2 6.5 - 8.1 g/dL   Albumin 4.2 3.5 - 5.0 g/dL   AST 26 15 - 41 U/L    ALT 21 0 - 44 U/L   Alkaline Phosphatase 58 38 - 126 U/L   Total  Bilirubin 0.9 <1.2 mg/dL   GFR, Estimated >16 >10 mL/min    Comment: (NOTE) Calculated using the CKD-EPI Creatinine Equation (2021)    Anion gap 14 5 - 15    Comment: Performed at [facility], [address], [place], [place] [address]  Lipid panel     Status: None   Collection Time: 07/05/23  4:06 AM  Result Value Ref Range   Cholesterol 186 0 - 200 mg/dL   Triglycerides 75 <409 mg/dL   HDL 77 >81 mg/dL   Total CHOL/HDL Ratio 2.4 RATIO   VLDL 15 0 - 40 mg/dL   LDL Cholesterol 94 0 - 99 mg/dL    Comment:        Total Cholesterol/HDL:CHD Risk Coronary Heart Disease Risk Table                     Men   Women  1/2 Average Risk   3.4   3.3  Average Risk       5.0   4.4  2 X Average Risk   9.6   7.1  3 X Average Risk  23.4   11.0        Use the calculated Patient Ratio above and the CHD Risk Table to determine the patient's CHD Risk.        ATP III CLASSIFICATION (LDL):  <100     mg/dL   Optimal  191-478  mg/dL   Near or Above                    Optimal  130-159  mg/dL   Borderline  295-621  mg/dL   High  >308     mg/dL   Very High Performed at [facility], [address] [place], [place] [address]   Hemoglobin A1c     Status: None   Collection Time: 07/05/23  4:06 AM  Result Value Ref Range   Hgb A1c MFr Bld 5.2 4.8 - 5.6 %    Comment: (NOTE) Pre diabetes:          5.7%-6.4%  Diabetes:              >6.4%  Glycemic control for   <7.0% adults with diabetes    Mean Plasma Glucose 102.54 mg/dL    Comment: Performed at [facility], [address], [place], [place] [address]  I-stat chem 8, ED     Status: Abnormal   Collection Time: 07/05/23  4:10 AM  Result Value Ref Range   Sodium 133 (L) 135 - 145 mmol/L   Potassium 3.5 3.5 - 5.1 mmol/L   Chloride 100 98 - 111 mmol/L   BUN 31 (H) 8 - 23 mg/dL   Creatinine, Ser 5.28 0.44 - 1.00 mg/dL   Glucose, Bld 413 (H) 70 - 99 mg/dL     Comment: Glucose reference range applies only to samples taken after fasting for at least 8 hours.   Calcium, Ion 1.04 (L) 1.15 - 1.40 mmol/L   TCO2 21 (L) 22 - 32 mmol/L   Hemoglobin 15.3 (H) 12.0 - 15.0 g/dL   HCT 24.4 01.0 - 27.2 %  TSH     Status: None   Collection Time: 07/05/23  7:06 AM  Result Value Ref Range   TSH 2.239 0.350 - 4.500 uIU/mL    Comment: Performed by a 3rd Generation assay with a functional sensitivity of <=0.01 uIU/mL. Performed at [facility]  Lab, [address], [place], [place] [address]    MR BRAIN WO CONTRAST  Result Date: 07/05/2023 CLINICAL DATA:  77 year old female code stroke presentation this morning, trauma with orbital floor fracture. Left PCA stenosis on CTA. EXAM: MRI HEAD WITHOUT CONTRAST TECHNIQUE: Multiplanar, multiecho pulse sequences of the brain and surrounding structures were obtained without intravenous contrast. COMPARISON:  Head CT, face CT, cervical spine CT, CTA and CTP this morning. FINDINGS: Brain: Somewhat noisy DWI.  No convincing diffusion restriction. Patchy and scattered bilateral cerebral white matter T2 and FLAIR hyperintensity in both hemispheres. Left lentiform perivascular space (normal variant). Deep gray matter nuclei relatively spared. Minimal involvement of the pons. Cerebellum appears spared. No cortical encephalomalacia or chronic cerebral blood products identified. No midline shift, mass effect, evidence of mass lesion, ventriculomegaly, extra-axial collection or acute intracranial hemorrhage. Cervicomedullary junction and pituitary are within normal limits. Vascular: Major intracranial vascular flow voids are preserved. Skull and upper cervical spine: Facial fractures detailed by CT. Normal background bone marrow signal. Negative visible cervical spine for age. Sinuses/Orbits: Right orbit fracture and associated soft tissue injury as per CT. Layering hemorrhage in the right maxillary sinus. Other: Right face  hematoma/contusion/edema. Mastoids remain clear. Grossly normal visible internal auditory structures. IMPRESSION: 1. No acute infarct or acute intracranial abnormality identified. Moderate for age cerebral white matter signal changes. 2. Acute traumatic findings about the right face and orbit as per CT today. Electronically Signed   By: Odessa Fleming M.D.   On: 07/05/2023 10:59   CT CERVICAL SPINE WO CONTRAST  Result Date: 07/05/2023 CLINICAL DATA:  77 year old female. Code stroke. Fall. Abnormal head CT. EXAM: CT CERVICAL SPINE WITHOUT CONTRAST TECHNIQUE: Multidetector CT imaging of the cervical spine was performed without intravenous contrast. Multiplanar CT image reconstructions were also generated. RADIATION DOSE REDUCTION: This exam was performed according to the departmental dose-optimization program which includes automated exposure control, adjustment of the mA and/or kV according to patient size and/or use of iterative reconstruction technique. COMPARISON:  CT head and face today. CTA neck today reported separately. FINDINGS: Alignment: Straightening of lower cervical and mild reversal of upper cervical lordosis. Mild degenerative appearing anterolisthesis of C7 on T1. Bilateral posterior element alignment is within normal limits. Skull base and vertebrae: Visualized skull base is intact. No atlanto-occipital dissociation. C1 and C2 appear intact and aligned. No acute osseous abnormality identified. Soft tissues and spinal canal: No prevertebral fluid or swelling. No visible canal hematoma. Face reported separately. Negative noncontrast visible deep soft tissue spaces of the neck. Disc levels: Moderate to severe chronic cervical disc and endplate degeneration C3-C4 through C5-C6 superimposed on degenerative appearing C6-C7 ankylosis. Widespread superimposed facet arthropathy bilaterally, although generally worse on the right. There is evidence of developing facet ankylosis on the right at C3-C4. There is  mild to moderate spinal stenosis at C4-C5, and moderate to severe spinal stenosis at C5-C6 (series 4, image 62). Upper chest: Minimally included, see Neck CTA reported separately. IMPRESSION: 1. No acute traumatic injury identified in the cervical spine. 2. 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 Multilevel spinal stenosis, up to severe at C5-C6, with evidence of spinal cord mass effect. Electronically Signed   By: Odessa Fleming M.D.   On: 07/05/2023 05:11   CT Maxillofacial Wo Contrast  Result Date: 07/05/2023 CLINICAL DATA:  77 year old female. Code stroke. Fall. Abnormal head CT. EXAM: CT MAXILLOFACIAL WITHOUT CONTRAST TECHNIQUE: Multidetector CT imaging of the maxillofacial structures was performed. Multiplanar CT  image reconstructions were also generated. RADIATION DOSE REDUCTION: This exam was performed according to the departmental dose-optimization program which includes automated exposure control, adjustment of the mA and/or kV according to patient size and/or use of iterative reconstruction technique. COMPARISON:  Head CT today reported separately. Prior face CT 10/10/2007. FINDINGS: Osseous: Severe left TMJ degeneration. Mandible intact and normally located. Right maxillary dentition periapical lucency but no complicating features. Left maxillary sinus walls are intact. Right maxillary sinus walls are intact but there is layering hemorrhage within the sinus. See orbit findings below. Bilateral zygoma and pterygoid bones appear intact. No acute nasal bone fracture. Central skull base intact. Cervical spine detailed separately. Orbits: Chronic left orbital floor fracture, improved compared to 2009. Acute right orbital floor fracture with comminution, herniated intraorbital fat (series 6, image 50) and marginal herniation of the inferior rectus muscle on image 51. There is mild contusion, fat stranding in the inferior right orbit. No  intraorbital hematoma. Mild right lamina papyracea fracture also. Other orbital walls remain intact. Globes and other intraorbital soft tissues are within normal limits. There is additional right orbit preseptal, periorbital soft tissue hematoma/contusion which tracks to the forehead. Sinuses: Focal hemorrhage in a right ethmoid air cell and layering hemorrhage in the right maxillary sinus related to the orbit fractures. Other paranasal sinuses are well aerated. Tympanic cavities and mastoids are clear. Soft tissues: Negative visible noncontrast thyroid, larynx, pharynx, parapharyngeal spaces, retropharyngeal space, sublingual space, submandibular spaces, masticator and parotid spaces are within normal limits. Right anterior and lateral face roughly 5 cm area of superficial hematoma/contusion. This overlies the right zygomaticomaxillary confluence which appears to remain intact. No soft tissue gas. Limited intracranial: Stable to that reported separately. IMPRESSION: 1. Acute Right Orbital floor and lamina papyracea fractures. Comminuted and displaced right floor fracture with herniated intraorbital fat, marginally herniated inferior rectus muscle. Minimal intraorbital contusion associated. Hemorrhage in the right maxillary sinus. 2. No other acute facial fracture identified. Additional right face and periorbital superficial hematoma/contusion. 3. 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 Electronically Signed   By: Odessa Fleming M.D.   On: 07/05/2023 05:04   CT ANGIO HEAD NECK W WO CM (CODE STROKE)  Result Date: 07/05/2023 CLINICAL DATA:  77 year old female with aphasia.  Trauma. EXAM: CT ANGIOGRAPHY HEAD AND NECK CT PERFUSION BRAIN TECHNIQUE: Multidetector CT imaging of the head and neck was performed using the standard protocol during bolus administration of intravenous contrast. Multiplanar CT image reconstructions and MIPs were obtained to evaluate the vascular anatomy. Carotid stenosis  measurements (when applicable) are obtained utilizing NASCET criteria, using the distal internal carotid diameter as the denominator. Multiphase CT imaging of the brain was performed following IV bolus contrast injection. Subsequent parametric perfusion maps were calculated using RAPID software. RADIATION DOSE REDUCTION: This exam was performed according to the departmental dose-optimization program which includes automated exposure control, adjustment of the mA and/or kV according to patient size and/or use of iterative reconstruction technique. CONTRAST:  40mL OMNIPAQUE IOHEXOL 350 MG/ML SOLN; 75mL OMNIPAQUE IOHEXOL 350 MG/ML SOLN COMPARISON:  Head CT 0416 hours today. FINDINGS: CT Brain Perfusion Findings: ASPECTS: 10 CBF (<30%) Volume: 0mL.  All CBF in CBV parameters are negative. Perfusion (Tmax>6.0s) volume: 11mL.  Hypoperfusion index of 0 Mismatch Volume: Possibly 11mL Infarction Location:No core infarct detected, abnormal Tmax in the left PCA territory. See below. CTA NECK Skeleton: Face and cervical spine are detailed separately. Grossly intact visible upper thorax. Upper chest: Atelectasis. Mild retained gas and secretions in the  thoracic esophagus which is not abnormally dilated. Visible central pulmonary arteries appear patent. Other neck: Superficial right face soft tissue injury reported separately. Aortic arch: Mild to moderate Calcified aortic atherosclerosis. Bovine arch configuration. Right carotid system: Tortuous brachiocephalic artery and right CCA origin with no significant plaque or stenosis. Significant right subclavian venous contrast streak artifact. Visible right CCA is normal. Minimal calcified plaque at the right carotid bifurcation. Retropharyngeal bifurcation and course of the right ICA with no significant plaque or stenosis to the skull base. Tortuous vessel below the skull base. Left carotid system: Bovine left CCA origin is tortuous with no significant plaque or stenosis. No  significant plaque or stenosis at the bifurcation. Tortuous left ICA to the skull base without stenosis. Vertebral arteries: Tortuous proximal right subclavian artery and normal right vertebral artery origin with no plaque or stenosis. Right vertebral appears somewhat dominant, patent and within normal limits to the skull base. Proximal left subclavian mild plaque and tortuosity without stenosis. Left vertebral artery origin is normal. Tortuous left V1 segment. Mildly non dominant left vertebral artery appears patent to the skull base with no significant plaque or stenosis. CTA HEAD Posterior circulation: Patent distal vertebral arteries, PICA origins, vertebrobasilar junction without plaque or stenosis. Mildly dominant right V4. Patent basilar artery without stenosis. Patent SCA and PCA origins. Both posterior communicating arteries are present. The right PCA branches are within normal limits. There is a severe short segment left PCA P1 stenosis best demonstrated on series 7, images 145 and 146. This is just proximal to the left posterior communicating artery junction. But beyond that the left PCA branches are within normal limits. Anterior circulation: Both ICA siphons are patent. No significant siphon plaque or stenosis. Mild calcified plaque is greater on the right. Both posterior communicating and ophthalmic artery origins appear normal. Patent carotid termini. Normal MCA and ACA origins. Anterior communicating artery is mildly ectatic on series 7, image 156 but there is no discrete aneurysm at this time. Bilateral ACA branches are patent with mild irregularity, no significant stenosis. Right MCA M1 segment, right MCA trifurcation and right MCA branches are within normal limits. Left MCA M1 segment and trifurcation are patent without stenosis, mildly ectatic. Left MCA branches are within normal limits. No branch occlusion or stenosis identified. Venous sinuses: Early contrast timing, not evaluated. Anatomic  variants: Mildly dominant right vertebral artery. Review of the MIP images confirms the above findings IMPRESSION: 1. 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 But no core infarct., abnormal CBF or CBV on CT Perfusion. This was discussed by telephone with Dr. Erick Blinks on 07/05/2023 at 0438 hours. 2. CTA is negative for large vessel occlusion. Generalized vessel tortuosity in the head and neck with generally mild for age atherosclerosis. No other hemodynamically significant arterial stenosis. The anterior communicating artery is ectatic but without overt aneurysm. 3.  Aortic Atherosclerosis (ICD10-I70.0). Electronically Signed   By: Odessa Fleming M.D.   On: 07/05/2023 04:51   CT CEREBRAL PERFUSION W CONTRAST  Result Date: 07/05/2023 CLINICAL DATA:  77 year old female with aphasia.  Trauma. EXAM: CT ANGIOGRAPHY HEAD AND NECK CT PERFUSION BRAIN TECHNIQUE: Multidetector CT imaging of the head and neck was performed using the standard protocol during bolus administration of intravenous contrast. Multiplanar CT image reconstructions and MIPs were obtained to evaluate the vascular anatomy. Carotid stenosis measurements (when applicable) are obtained utilizing NASCET criteria, using the distal internal carotid diameter as the denominator. Multiphase  CT imaging of the brain was performed following IV bolus contrast injection. Subsequent parametric perfusion maps were calculated using RAPID software. RADIATION DOSE REDUCTION: This exam was performed according to the departmental dose-optimization program which includes automated exposure control, adjustment of the mA and/or kV according to patient size and/or use of iterative reconstruction technique. CONTRAST:  40mL OMNIPAQUE IOHEXOL 350 MG/ML SOLN; 75mL OMNIPAQUE IOHEXOL 350 MG/ML SOLN COMPARISON:  Head CT 0416 hours today. FINDINGS: CT Brain Perfusion Findings: ASPECTS: 10 CBF (<30%) Volume:  0mL.  All CBF in CBV parameters are negative. Perfusion (Tmax>6.0s) volume: 11mL.  Hypoperfusion index of 0 Mismatch Volume: Possibly 11mL Infarction Location:No core infarct detected, abnormal Tmax in the left PCA territory. See below. CTA NECK Skeleton: Face and cervical spine are detailed separately. Grossly intact visible upper thorax. Upper chest: Atelectasis. Mild retained gas and secretions in the thoracic esophagus which is not abnormally dilated. Visible central pulmonary arteries appear patent. Other neck: Superficial right face soft tissue injury reported separately. Aortic arch: Mild to moderate Calcified aortic atherosclerosis. Bovine arch configuration. Right carotid system: Tortuous brachiocephalic artery and right CCA origin with no significant plaque or stenosis. Significant right subclavian venous contrast streak artifact. Visible right CCA is normal. Minimal calcified plaque at the right carotid bifurcation. Retropharyngeal bifurcation and course of the right ICA with no significant plaque or stenosis to the skull base. Tortuous vessel below the skull base. Left carotid system: Bovine left CCA origin is tortuous with no significant plaque or stenosis. No significant plaque or stenosis at the bifurcation. Tortuous left ICA to the skull base without stenosis. Vertebral arteries: Tortuous proximal right subclavian artery and normal right vertebral artery origin with no plaque or stenosis. Right vertebral appears somewhat dominant, patent and within normal limits to the skull base. Proximal left subclavian mild plaque and tortuosity without stenosis. Left vertebral artery origin is normal. Tortuous left V1 segment. Mildly non dominant left vertebral artery appears patent to the skull base with no significant plaque or stenosis. CTA HEAD Posterior circulation: Patent distal vertebral arteries, PICA origins, vertebrobasilar junction without plaque or stenosis. Mildly dominant right V4. Patent basilar  artery without stenosis. Patent SCA and PCA origins. Both posterior communicating arteries are present. The right PCA branches are within normal limits. There is a severe short segment left PCA P1 stenosis best demonstrated on series 7, images 145 and 146. This is just proximal to the left posterior communicating artery junction. But beyond that the left PCA branches are within normal limits. Anterior circulation: Both ICA siphons are patent. No significant siphon plaque or stenosis. Mild calcified plaque is greater on the right. Both posterior communicating and ophthalmic artery origins appear normal. Patent carotid termini. Normal MCA and ACA origins. Anterior communicating artery is mildly ectatic on series 7, image 156 but there is no discrete aneurysm at this time. Bilateral ACA branches are patent with mild irregularity, no significant stenosis. Right MCA M1 segment, right MCA trifurcation and right MCA branches are within normal limits. Left MCA M1 segment and trifurcation are patent without stenosis, mildly ectatic. Left MCA branches are within normal limits. No branch occlusion or stenosis identified. Venous sinuses: Early contrast timing, not evaluated. Anatomic variants: Mildly dominant right vertebral artery. Review of the MIP images confirms the above findings IMPRESSION: 1. 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 But no core infarct., abnormal CBF or CBV on CT Perfusion. This was discussed by telephone with Dr.  SALMAN KHALIQDINA on 07/05/2023 at 0438 hours. 2. CTA is negative for large vessel occlusion. Generalized vessel tortuosity in the head and neck with generally mild for age atherosclerosis. No other hemodynamically significant arterial stenosis. The anterior communicating artery is ectatic but without overt aneurysm. 3.  Aortic Atherosclerosis (ICD10-I70.0). Electronically Signed   By: Odessa Fleming M.D.   On: 07/05/2023 04:51    CT HEAD CODE STROKE WO CONTRAST  Result Date: 07/05/2023 CLINICAL DATA:  Code stroke. 77 year old female (not otherwise specified at the time of this report). EXAM: CT HEAD WITHOUT CONTRAST TECHNIQUE: Contiguous axial images were obtained from the base of the skull through the vertex without intravenous contrast. RADIATION DOSE REDUCTION: This exam was performed according to the departmental dose-optimization program which includes automated exposure control, adjustment of the mA and/or kV according to patient size and/or use of iterative reconstruction technique. COMPARISON:  Head CT 10/10/2007. FINDINGS: Brain: Cerebral volume is within normal limits for age. No midline shift, mass effect, or evidence of intracranial mass lesion. No ventriculomegaly. No acute intracranial hemorrhage identified. Patchy asymmetric frontal horn and anterior white matter capsule hypodensity appears chronic and stable. No cortically based acute infarct identified. Left basal ganglia perivascular space (normal variant). Vascular: Calcified atherosclerosis at the skull base. No suspicious intracranial vascular hyperdensity. Skull: Acute appearing right orbital floor fracture. Chronic left orbital floor fracture. Calvarium appears intact. Chronic left TMJ degeneration. Sinuses/Orbits: Layering hemorrhage in the right maxillary sinus. Right orbital floor fracture associated and new since 2009. Other paranasal sinuses well aerated. Tympanic cavities and mastoids well aerated. Other: Right forehead, periorbital, pre malar soft tissue injury with hematoma/contusion. No soft tissue gas identified. No gaze deviation. ASPECTS [facility] Stroke Program Early CT Score) Total score (0-10 with 10 being normal): 10 IMPRESSION: 1. No acute cortically based infarct or acute intracranial hemorrhage identified. ASPECTS 10. Chronic small vessel disease appears stable since 2009. 2. Acute traumatic findings including right orbital floor fracture,  scalp and face soft tissue injury. Chronic left orbital floor fracture. 3. These results were communicated to Dr. Derry Lory at 4:24 am on 07/05/2023 by text page via the [facility] messaging system. Electronically Signed   By: Odessa Fleming M.D.   On: 07/05/2023 04:24    Pending Labs Unresulted Labs (From admission, onward)     Start     Ordered   07/06/23 0500  CBC  Tomorrow morning,   R        07/05/23 0746   07/06/23 0500  Comprehensive metabolic panel  Tomorrow morning,   R        07/05/23 0746   07/05/23 0406  Urine rapid drug screen (hosp performed)  Once,   STAT        07/05/23 0406   07/05/23 0406  Urinalysis, Routine w reflex microscopic -Urine, Clean Catch  Once,   URGENT       Question:  Specimen Source  Answer:  Urine, Clean Catch   07/05/23 0406            Vitals/Pain Today's Vitals   07/05/23 0714 07/05/23 1100 07/05/23 1400 07/05/23 1426  BP: (!) 149/93 (!) 164/90 (!) 135/91   Pulse: 81 71 (!) 55   Resp: (!) 21 20 16    Temp: 97.7 F (36.5 C) 98 F (36.7 C) 98.1 F (36.7 C)   TempSrc: Oral Oral Oral   SpO2: 100% 100% 94%   Weight:      Height:      PainSc: 0-No pain 0-No pain  0-No pain 0-No pain    Isolation Precautions No active isolations  Medications Medications   stroke: early stages of recovery book (has no administration in time range)  0.9 %  sodium chloride infusion ( Intravenous New Bag/Given 07/05/23 0844)  acetaminophen (TYLENOL) tablet 650 mg (has no administration in time range)    Or  acetaminophen (TYLENOL) 160 MG/5ML solution 650 mg (has no administration in time range)    Or  acetaminophen (TYLENOL) suppository 650 mg (has no administration in time range)  senna-docusate (Senokot-S) tablet 1 tablet (has no administration in time range)  enoxaparin (LOVENOX) injection 40 mg (has no administration in time range)  iohexol (OMNIPAQUE) 350 MG/ML injection 75 mL (75 mLs Intravenous Contrast Given 07/05/23 0434)  iohexol (OMNIPAQUE) 350 MG/ML  injection 40 mL (40 mLs Intravenous Contrast Given 07/05/23 0435)  levETIRAcetam (KEPPRA) IVPB 1500 mg/ 100 mL premix (0 mg Intravenous Stopped 07/05/23 0903)    Followed by  levETIRAcetam (KEPPRA) IVPB 1500 mg/ 100 mL premix (0 mg Intravenous Stopped 07/05/23 0823)    Mobility walks     Focused Assessments Neuro Assessment Handoff:  Swallow screen pass? No    NIH Stroke Scale  Dizziness Present: No Headache Present: No Interval: Other (Comment) Level of Consciousness (1a.)   : Alert, keenly responsive LOC Questions (1b. )   : Answers one question correctly LOC Commands (1c. )   : Performs both tasks correctly Best Gaze (2. )  : Partial gaze palsy Visual (3. )  : Partial hemianopia Facial Palsy (4. )    : Normal symmetrical movements Motor Arm, Left (5a. )   : No drift Motor Arm, Right (5b. ) : No drift Motor Leg, Left (6a. )  : No drift Motor Leg, Right (6b. ) : No drift Limb Ataxia (7. ): Absent Sensory (8. )  : Normal, no sensory loss Best Language (9. )  : Mild-to-moderate aphasia Dysarthria (10. ): Mild-to-moderate dysarthria, patient slurs at least some words and, at worst, can be understood with some difficulty Extinction/Inattention (11.)   : No Abnormality Complete NIHSS TOTAL: 5     Neuro Assessment: Exceptions to WDL Neuro Checks:   Initial (07/05/23 0402)  Has TPA been given? No If patient is a Neuro Trauma and patient is going to OR before floor call report to 4N Charge nurse: [phone] or [phone]   R Recommendations: See Admitting Provider Note  Report given to:   Additional Notes:

## 2023-07-06 DIAGNOSIS — S0292XA Unspecified fracture of facial bones, initial encounter for closed fracture: Secondary | ICD-10-CM

## 2023-07-06 DIAGNOSIS — H532 Diplopia: Secondary | ICD-10-CM | POA: Diagnosis not present

## 2023-07-06 DIAGNOSIS — R569 Unspecified convulsions: Secondary | ICD-10-CM

## 2023-07-06 DIAGNOSIS — S0231XA Fracture of orbital floor, right side, initial encounter for closed fracture: Secondary | ICD-10-CM | POA: Diagnosis not present

## 2023-07-06 DIAGNOSIS — E876 Hypokalemia: Secondary | ICD-10-CM

## 2023-07-06 DIAGNOSIS — R4701 Aphasia: Secondary | ICD-10-CM | POA: Diagnosis not present

## 2023-07-06 LAB — COMPREHENSIVE METABOLIC PANEL
ALT: 16 U/L (ref 0–44)
AST: 18 U/L (ref 15–41)
Albumin: 3.5 g/dL (ref 3.5–5.0)
Alkaline Phosphatase: 47 U/L (ref 38–126)
Anion gap: 13 (ref 5–15)
BUN: 17 mg/dL (ref 8–23)
CO2: 20 mmol/L — ABNORMAL LOW (ref 22–32)
Calcium: 8.7 mg/dL — ABNORMAL LOW (ref 8.9–10.3)
Chloride: 100 mmol/L (ref 98–111)
Creatinine, Ser: 0.85 mg/dL (ref 0.44–1.00)
GFR, Estimated: >60 mL/min (ref >60)
Glucose, Bld: 103 mg/dL — ABNORMAL HIGH (ref 70–99)
Potassium: 3.2 mmol/L — ABNORMAL LOW (ref 3.5–5.1)
Sodium: 133 mmol/L — ABNORMAL LOW (ref 135–145)
Total Bilirubin: 0.6 mg/dL (ref <1.2)
Total Protein: 6.1 g/dL — ABNORMAL LOW (ref 6.5–8.1)

## 2023-07-06 LAB — CBC
HCT: 37.5 % (ref 36.0–46.0)
Hemoglobin: 12.4 g/dL (ref 12.0–15.0)
MCH: 29.7 pg (ref 26.0–34.0)
MCHC: 33.1 g/dL (ref 30.0–36.0)
MCV: 89.7 fL (ref 80.0–100.0)
Platelets: 191 10*3/uL (ref 150–400)
RBC: 4.18 MIL/uL (ref 3.87–5.11)
RDW: 13.5 % (ref 11.5–15.5)
WBC: 8.2 10*3/uL (ref 4.0–10.5)
nRBC: 0 % (ref 0.0–0.2)

## 2023-07-06 LAB — GLUCOSE, CAPILLARY: Glucose-Capillary: 98 mg/dL (ref 70–99)

## 2023-07-06 MED ORDER — WHITE PETROLATUM EX OINT
TOPICAL_OINTMENT | CUTANEOUS | Status: DC | PRN
  Filled 2023-07-06: qty 28.35

## 2023-07-06 MED ORDER — LORAZEPAM 1 MG PO TABS: 1.0000 mg | ORAL_TABLET | ORAL | Status: AC | PRN

## 2023-07-06 MED ORDER — THIAMINE MONONITRATE 100 MG PO TABS
100.0000 mg | ORAL_TABLET | Freq: Every day | ORAL | Status: DC
Start: 2023-07-06 — End: 2023-07-09
  Administered 2023-07-06 – 2023-07-09 (×4): 100 mg via ORAL
  Filled 2023-07-06 (×4): qty 1

## 2023-07-06 MED ORDER — LEVETIRACETAM 500 MG PO TABS
500.0000 mg | ORAL_TABLET | Freq: Two times a day (BID) | ORAL | Status: DC
  Administered 2023-07-06 – 2023-07-08 (×5): 500 mg via ORAL
  Filled 2023-07-06 (×5): qty 1

## 2023-07-06 MED ORDER — ADULT MULTIVITAMIN W/MINERALS CH
1.0000 | ORAL_TABLET | Freq: Every day | ORAL | Status: DC
  Administered 2023-07-06 – 2023-07-09 (×4): 1 via ORAL
  Filled 2023-07-06 (×4): qty 1

## 2023-07-06 MED ORDER — FOLIC ACID 1 MG PO TABS
1.0000 mg | ORAL_TABLET | Freq: Every day | ORAL | Status: DC
Start: 2023-07-06 — End: 2023-07-09
  Administered 2023-07-06 – 2023-07-09 (×4): 1 mg via ORAL
  Filled 2023-07-06 (×4): qty 1

## 2023-07-06 MED ORDER — THIAMINE HCL 100 MG/ML IJ SOLN
100.0000 mg | Freq: Every day | INTRAMUSCULAR | Status: DC
Start: 2023-07-06 — End: 2023-07-09

## 2023-07-06 MED ORDER — POTASSIUM CHLORIDE CRYS ER 20 MEQ PO TBCR
40.0000 meq | EXTENDED_RELEASE_TABLET | Freq: Once | ORAL | Status: AC
  Administered 2023-07-06: 40 meq via ORAL
  Filled 2023-07-06: qty 2

## 2023-07-06 NOTE — Progress Notes (Signed)
NEUROLOGY CONSULT FOLLOW UP NOTE   Date of service: July 06, 2023 Patient Name: Chloe Holmes MRN:  [id number] DOB:  1946-03-13  Brief HPI  Chloe Holmes is a 77 y.o. female  has a past medical history of Arthritis, Diverticulosis, Dyspnea, GERD (gastroesophageal reflux disease), History of hiatal hernia, HLD (hyperlipidemia), and Hypertension. who presented with  fall hitting the front of her head following which she was aphasic.     Interval Hx/subjective  Patient is awake and alert in NAD. She endorses having  a headache. She states she is much improved today as yesterday she was not able to speak. She is able to relate some history of what happened but unsure of certain things, such as why she was outside hollering for help and did not call for help while in the house. She is on LTM. LTM with Continuous slow, left hemisphere, maximal left temporal region. Due to EEG findings, her event was likely due to a seizure. Will start Keppra 500 mg BID   Vitals   Vitals:   07/05/23 1925 07/06/23 0135 07/06/23 0316 07/06/23 0730  BP: (!) 160/95 (!) 143/74 (!) 157/66 (!) 154/77  Pulse: 65 (!) 58 61 61  Resp: 18 18 18 20   Temp: 99.7 F (37.6 C) 98.7 F (37.1 C) (!) 100.4 F (38 C) 98.5 F (36.9 C)  TempSrc: Oral Oral Oral Oral  SpO2: 100% 100% 99% 98%  Weight:      Height:         Body mass index is 34.34 kg/m.  Physical Exam   Constitutional: Appears well-developed and well-nourished.  Psych: Affect appropriate to situation.  Eyes: No scleral injection.  HENT: No OP obstrucion. Bruising around left eye and left chin Head: Normocephalic.  Cardiovascular: Normal rate and regular rhythm.  Respiratory: Effort normal, non-labored breathing.  GI: Soft.  No distension. There is no tenderness.  Skin: WDI.   Neurologic Examination    Temp:  [98 F (36.7 C)-100.4 F (38 C)] 98.5 F (36.9 C) (11/11 0730) Pulse Rate:  [55-71] 61 (11/11 0730) Resp:  [16-20] 20 (11/11  0730) BP: (135-164)/(66-95) 154/77 (11/11 0730) SpO2:  [94 %-100 %] 98 % (11/11 0730) Weight:  [93.6 kg] 93.6 kg (11/10 1600)  General - Well nourished, well developed, in no apparent distress. Cardiovascular - Regular rhythm and rate.  Mental Status -  Level of arousal and orientation to time, place, and person were intact. Stated wrong age, "24".  Language including expression, naming, repetition, comprehension was assessed and found intact . Attention span and concentration were normal . Recent and remote memory were intact . Fund of Knowledge was assessed and was intact .  Cranial Nerves II - XII - II - Visual fields intact OU. Subjectively states she is having double vision and having trouble reading III, IV, VI - Extraocular movements intact . V - Facial sensation intact bilaterally . VII - Facial movement intact bilaterally . VIII - Hearing & vestibular intact bilaterally . X - Palate elevates symmetrically . XI - Chin turning & shoulder shrug intact bilaterally . XII - Tongue protrusion intact .  Motor Strength - The patient's strength was normal in all extremities and pronator drift was absent.  Bulk was normal and fasciculations were absent .   Motor Tone - Muscle tone was assessed at the neck and appendages and was normal . Sensory - Light touch, temperature/pinprick were assessed and were symmetrical.   Coordination - The patient had normal movements in  the hands and feet with no ataxia or dysmetria.  Tremor was absent. Gait and Station - deferred.  Labs and Diagnostic Imaging   CBC:  Recent Labs  Lab 07/05/23 0406 07/05/23 0410 07/06/23 0532  WBC 14.9*  --  8.2  NEUTROABS 13.1*  --   --   HGB 13.7 15.3* 12.4  HCT 41.9 45.0 37.5  MCV 92.3  --  89.7  PLT 202  --  191    Basic Metabolic Panel:  Lab Results  Component Value Date   NA 133 (L) 07/06/2023   K 3.2 (L) 07/06/2023   CO2 20 (L) 07/06/2023   GLUCOSE 103 (H) 07/06/2023   BUN 17 07/06/2023    CREATININE 0.85 07/06/2023   CALCIUM 8.7 (L) 07/06/2023   GFRNONAA >60 07/06/2023   GFRAA >60 01/28/2019   Lipid Panel:  Lab Results  Component Value Date   LDLCALC 94 07/05/2023   HgbA1c:  Lab Results  Component Value Date   HGBA1C 5.2 07/05/2023   Urine Drug Screen:     Component Value Date/Time   LABOPIA NONE DETECTED 07/05/2023 1525   COCAINSCRNUR NONE DETECTED 07/05/2023 1525   LABBENZ NONE DETECTED 07/05/2023 1525   AMPHETMU NONE DETECTED 07/05/2023 1525   THCU NONE DETECTED 07/05/2023 1525   LABBARB NONE DETECTED 07/05/2023 1525    Alcohol Level     Component Value Date/Time   ETH <10 07/05/2023 0406   INR  Lab Results  Component Value Date   INR 1.0 07/05/2023   APTT  Lab Results  Component Value Date   APTT 28 07/05/2023   AED levels: No results found for: "PHENYTOIN", "ZONISAMIDE", "LAMOTRIGINE", "LEVETIRACETA"  CT Head without contrast(Personally reviewed): No acute process, aspects 10   CT angio Head and Neck with contrast(Personally reviewed): Positive for a 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 No seizures or epileptiform discharges were seen throughout the recording.     Impression   Chloe Holmes is a 77 y.o. female who presents after having a fall and hitting her head followed by aphasia. MRI brain is negative for acute finding. Now back to baseline. This event was likely a seizure and with EEG findings most likely due to postictal state.  Recommendations  Seizure precautions  Recommend starting Keppra p.o. 500 mg twice daily Continue LTM for another 24 hours  Will need outpatient neurology follow-up Outpatient seizure precautions as outlined below.   SEIZURE  PRECAUTIONS Per [facility] statutes, patients with seizures are not allowed to drive until they have been seizure-free for six months.   Use caution when using heavy equipment or power tools. Avoid working on ladders or at heights. Take showers instead of baths. Ensure the water temperature is not too high on the home water heater. Do not go swimming alone. Do not lock yourself in a room alone (i.e. bathroom). When caring for infants or small children, sit down when holding, feeding, or changing them to minimize risk of injury to the child in the event you have a seizure. Maintain good sleep hygiene. Avoid alcohol.    If patient has another seizure, call 911 and bring them back to the ED if: A.  The seizure lasts longer than 5 minutes.      B.  The patient  doesn't wake shortly after the seizure or has new problems such as difficulty seeing, speaking or moving following the seizure C.  The patient was injured during the seizure D.  The patient has a temperature over 102 F (39C) E.  The patient vomited during the seizure and now is having trouble breathing ______________________________________________________________________   Thank you for the opportunity to take part in the care of this patient. If you have any further questions, please contact the neurology consultation team on call. Updated oncall schedule is listed on AMION.   Mathews Argyle  Electronically signed: Dr. Caryl Pina

## 2023-07-06 NOTE — Progress Notes (Signed)
Inpatient Rehab Admissions Coordinator:   Pt progressing well.  Spoke with OT and RNCM who state pt would like to d/c directly home if possible.  I will f/u with her tomorrow after therapy to see if this is feasible.   Estill Dooms, PT, DPT Admissions Coordinator [phone] 07/06/23  3:24 PM

## 2023-07-06 NOTE — Progress Notes (Signed)
Occupational Therapy Evaluation Patient Details Name: Chloe Holmes MRN: [id number] DOB: 01-29-1946 Today's Date: 07/06/2023   History of Present Illness Pt is a 77 y.o. female who presented 07/05/23 s/p fall with AMS and aphasia. CT head negative for hemorrhage. Does show a right orbital floor fracture. She is out of the window for TNK. 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 Pt being worked up for acute stroke vs TBI/concussion vs epileptic aphasia. PMH: GERD, HTN, HLD   Clinical Impression   Chloe Holmes was evaluated s/p the above admission list. She lives alone and is indep at baseline. Upon evaluation the pt was limited by word finding difficulties, mild confusion, headache, eye/facial pain, unsteady balance, diplopia and decreased activity tolerance. Overall she needed min A for initial transfer without AD but progressed to CGA for transfers and mobility with RW. Due to the deficits listed below the pt also needs up to CGA for ADLs with cues for safety. Pt was educated on occlusion glasses to improve her diplopia - initially the nasal portion of her L eye was occluded but pt reported discomfort. Occluded nasal portion of R eye with much improvement but still some report of diplopia, occluded more of the lens and pt stated diplopia was resolved. Family and pt verbalized understanding of strategy. Pt will benefit from continued acute OT services and intensive inpatient follow up therapy, >3 hours/day after discharge.        If plan is discharge home, recommend the following: A little help with walking and/or transfers;A little help with bathing/dressing/bathroom;Assistance with cooking/housework;Direct supervision/assist for medications management;Direct supervision/assist for financial management;Assist for transportation;Help with stairs or ramp for entrance    Functional Status Assessment  Patient has had a recent decline in their functional status and  demonstrates the ability to make significant improvements in function in a reasonable and predictable amount of time.  Equipment Recommendations  Other (comment) (RW)    Recommendations for Other Services Rehab consult     Precautions / Restrictions Precautions Precautions: Fall Restrictions Weight Bearing Restrictions: No      Mobility Bed Mobility Overal bed mobility: Needs Assistance Bed Mobility: Supine to Sit, Sit to Supine     Supine to sit: Contact guard Sit to supine: Contact guard assist        Transfers Overall transfer level: Needs assistance Equipment used: Rolling walker (2 wheels), None Transfers: Sit to/from Stand Sit to Stand: Min assist, Contact guard assist           General transfer comment: min A without AD (unsteady upon standing.) CGA with AD from bed and toilet      Balance Overall balance assessment: Needs assistance Sitting-balance support: No upper extremity supported, Feet supported Sitting balance-Leahy Scale: Fair     Standing balance support: Single extremity supported, During functional activity Standing balance-Leahy Scale: Fair Standing balance comment: able to groom in standing at the sink without UE support                           ADL either performed or assessed with clinical judgement   ADL Overall ADL's : Needs assistance/impaired Eating/Feeding: Independent   Grooming: Supervision/safety;Standing   Upper Body Bathing: Set up;Sitting   Lower Body Bathing: Contact guard assist;Sit to/from stand   Upper Body Dressing : Set up;Sitting   Lower Body Dressing: Contact guard assist   Toilet Transfer: Contact guard assist;Ambulation;Rolling walker (2 wheels)   Toileting- Clothing Manipulation  and Hygiene: Supervision/safety;Sit to/from stand       Functional mobility during ADLs: Contact guard assist;Rolling walker (2 wheels) General ADL Comments: Unsteady, needs RW. CGA for safety. sequenced ADLs  appropriately     Vision Baseline Vision/History: 1 Wears glasses Vision Assessment?: Vision impaired- to be further tested in functional context;Yes Diplopia Assessment: Disappears with one eye closed Additional Comments: pt endorses double vision - occlusion glasses corrected DV     Perception Perception: Within Functional Limits       Praxis Praxis: WFL       Pertinent Vitals/Pain Pain Assessment Pain Assessment: Faces Faces Pain Scale: Hurts little more Pain Location: head Pain Descriptors / Indicators: Headache Pain Intervention(s): Limited activity within patient's tolerance     Extremity/Trunk Assessment Upper Extremity Assessment Upper Extremity Assessment: Generalized weakness   Lower Extremity Assessment Lower Extremity Assessment: Defer to PT evaluation   Cervical / Trunk Assessment Cervical / Trunk Assessment: Normal   Communication Communication Communication: No apparent difficulties   Cognition Arousal: Alert Behavior During Therapy: WFL for tasks assessed/performed Overall Cognitive Status: Impaired/Different from baseline Area of Impairment: Attention, Awareness, Problem solving                   Current Attention Level: Selective       Awareness: Emergent Problem Solving: Slow processing, Requires verbal cues General Comments: oriented x4, following all commands. Understands and able to recall education of occlusion glasses. able to remember several events, conversations and people from yesterday when she was "confused." noteable work finding difficulty     General Comments  VSS, hooked to EEG, family present            Home Living Family/patient expects to be discharged to:: Private residence Living Arrangements: Alone Available Help at Discharge: Family;Available 24 hours/day Type of Home: House Home Access: Stairs to enter [employer] of Steps: 1   Home Layout: One level     Bathroom Shower/Tub: Retail banker: Standard            Lives With: Alone    Prior Functioning/Environment Prior Level of Function : Independent/Modified Independent;Driving;Patient poor historian/Family not available             Mobility Comments: no AD ADLs Comments: indep, drives, cooks, cleans        OT Problem List: Decreased activity tolerance;Impaired balance (sitting and/or standing);Decreased cognition;Impaired vision/perception;Decreased safety awareness      OT Treatment/Interventions: Self-care/ADL training;Therapeutic exercise;DME and/or AE instruction;Therapeutic activities;Patient/family education;Balance training    OT Goals(Current goals can be found in the care plan section) Acute Rehab OT Goals Patient Stated Goal: to get better OT Goal Formulation: With patient Time For Goal Achievement: 07/20/23 Potential to Achieve Goals: Good ADL Goals Pt Will Perform Grooming: Independently;standing Pt Will Perform Lower Body Dressing: Independently;sit to/from stand Pt Will Transfer to Toilet: Independently;ambulating Additional ADL Goal #1: pt will independently complete IADL medication management task without errors Additional ADL Goal #2: Pt will indep utilize visual occlusion tehcniuqes to manage diplopia during functional tasks and mobility  OT Frequency: Min 1X/week       AM-PAC OT "6 Clicks" Daily Activity     Outcome Measure Help from another person eating meals?: None Help from another person taking care of personal grooming?: A Little Help from another person toileting, which includes using toliet, bedpan, or urinal?: A Little Help from another person bathing (including washing, rinsing, drying)?: A Little Help from another person to put on  and taking off regular upper body clothing?: A Little Help from another person to put on and taking off regular lower body clothing?: A Little 6 Click Score: 19   End of Session Equipment Utilized During Treatment: Gait  belt;Rolling walker (2 wheels) Nurse Communication: Mobility status  Activity Tolerance: Patient tolerated treatment well Patient left: in bed;with call bell/phone within reach;with bed alarm set;with family/visitor present  OT Visit Diagnosis: Unsteadiness on feet (R26.81);Muscle weakness (generalized) (M62.81);History of falling (Z91.81)                Time: 6295-2841 OT Time Calculation (min): 30 min Charges:  OT General Charges $OT Visit: 1 Visit OT Evaluation $OT Eval Moderate Complexity: 1 Mod OT Treatments $Self Care/Home Management : 8-22 mins  Derenda Mis, OTR/L Acute Rehabilitation Services Office [phone] Secure Chat Communication Preferred   Donia Pounds 07/06/2023, 12:58 PM

## 2023-07-06 NOTE — Procedures (Signed)
Patient Name: Daynah Ramsier  MRN: [id number]  Epilepsy Attending: Charlsie Quest  Referring Physician/Provider: Erick Blinks, MD  Duration: 07/05/2023 1104 to 07/06/2023 1104  Patient history: 77 y.o. female presenting with fall and expressive greater than receptive aphasia. EEG to evaluate for seizure  Level of alertness: Awake, asleep  AEDs during EEG study: None  Technical aspects: This EEG study was done with scalp electrodes positioned according to the 10-20 International system of electrode placement. Electrical activity was reviewed with band pass filter of 1-70Hz , sensitivity of 7 uV/mm, display speed of 22mm/sec with a 60Hz  notched filter applied as appropriate. EEG data were recorded continuously and digitally stored.  Video monitoring was available and reviewed as appropriate.  Description: The posterior dominant rhythm consists of 8-9 Hz activity of moderate voltage (25-35 uV) seen predominantly in posterior head regions, symmetric and reactive to eye opening and eye closing. Sleep was characterized by vertex waves, sleep spindles (12 to 14 Hz), maximal frontocentral region. EEG showed continuous 3 to 6 Hz theta-delta slowing in left hemisphere, maximal left temporal region. Hyperventilation and photic stimulation were not performed.     ABNORMALITY - Continuous slow, left hemisphere, maximal left temporal region  IMPRESSION: This study is suggestive of cortical dysfunction arising from left hemisphere, maximal left temporal region likely secondary to underlying structural abnormality, post-ictal state. No seizures or epileptiform discharges were seen throughout the recording.  Pattie Flaharty Annabelle Harman

## 2023-07-06 NOTE — Hospital Course (Signed)
Chloe Holmes is a 77 y.o. female with a history of hypertension, hyperlipidemia, PVCs, anxiety, arthritis, hiatal hernia, GERD, obesity.  Patient presented secondary to a fall at home with associated aphasia concerning for stroke. Code stroke was activated with CT and MRI negative for acute stroke, but CT significant for left PCA severe stenosis. EEG ordered for evaluation of possible seizures. Patient was also found to have trauma with resultant facial fractures, involving the right orbit. ENT and ophthalmology consulted with initial recommendations for no inpatient management.

## 2023-07-06 NOTE — Progress Notes (Signed)
SLP Cancellation Note  Patient Details Name: Chloe Holmes MRN: [id number] DOB: 03-Jun-1946   Cancelled treatment:       Reason Eval/Treat Not Completed: SLP screened, pt passed the [facility] swallow screen and has no concerns with swallowing on a regular texture diet. No needs identified, will sign off for swallowing and f/u for cognitive evaluation.    Gwynneth Aliment, M.A., CF-SLP Speech Language Pathology, Acute Rehabilitation Services  Secure Chat preferred [phone]  07/06/2023, 10:36 AM

## 2023-07-06 NOTE — TOC Initial Note (Signed)
Transition of Care [facility]) - Initial/Assessment Note    Patient Details  Name: Chloe Holmes MRN: [id number] Date of Birth: 1946-05-31  [facility] CM/SW Contact:    Kermit Balo, RN Phone Number: 07/06/2023, 1:53 PM  Clinical Narrative:                  Pt is from home alone. She states she has friends and family that can provide assistance/ supervision at home if needed.  DME: cane/ walker/ shower bench/ BSC at home She denies issues with transportation.  She denies issues with home medications. CIR following to see if she progresses to not needing IR. TOC following.  Expected Discharge Plan: IP Rehab Facility Barriers to Discharge: Continued Medical Work up   Patient Goals and CMS Choice   CMS Medicare.gov Compare Post Acute Care list provided to:: Patient Choice offered to / list presented to : Patient      Expected Discharge Plan and Services   Discharge Planning Services: CM Consult   Living arrangements for the past 2 months: Single Family Home                                      Prior Living Arrangements/Services Living arrangements for the past 2 months: Single Family Home Lives with:: Self Patient language and need for interpreter reviewed:: Yes Do you feel safe going back to the place where you live?: Yes            Criminal Activity/Legal Involvement Pertinent to Current Situation/Hospitalization: No - Comment as needed  Activities of Daily Living   ADL Screening (condition at time of admission) Independently performs ADLs?: Yes (appropriate for developmental age) Is the patient deaf or have difficulty hearing?: No Does the patient have difficulty seeing, even when wearing glasses/contacts?: No Does the patient have difficulty concentrating, remembering, or making decisions?: No  Permission Sought/Granted                  Emotional Assessment Appearance:: Appears stated age Attitude/Demeanor/Rapport:  Engaged Affect (typically observed): Accepting Orientation: : Oriented to Self, Oriented to Place, Oriented to  Time, Oriented to Situation   Psych Involvement: No (comment)  Admission diagnosis:  CVA (cerebral vascular accident) (HCC) [I63.9] Fall, initial encounter [W19.XXXA] Cerebral infarction, unspecified mechanism (HCC) [I63.9] Closed fracture of right orbital floor, initial encounter [facility]) [S02.31XA] Patient Active Problem List   Diagnosis Date Noted   Diplopia 07/06/2023   Hypokalemia 07/06/2023   Aphasia 07/05/2023   Multiple facial fractures (HCC) 07/05/2023   Fall at home, initial encounter 07/05/2023   Leukocytosis 07/05/2023   History of palpitations 07/05/2023   Hyponatremia 07/05/2023   Diastolic dysfunction 07/05/2023   Hyperlipidemia 07/05/2023   Closed fracture of right orbital floor (HCC) 07/05/2023   S/P left TKA 01/27/2019   S/P total knee replacement, 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 [place]  [address] Phone: [phone] Fax: [phone]  [facility] - [address] [place] - [address] [place] [address] Phone: [phone] Fax: [phone]     Social Determinants of Health (SDOH) Social History: SDOH Screenings   Food Insecurity: No Food Insecurity (07/06/2023)  Housing: Low Risk  (07/06/2023)  Transportation Needs: No Transportation Needs (07/06/2023)  Utilities: Not At Risk (07/06/2023)  Tobacco Use: Low  Risk  (07/05/2023)   SDOH Interventions:     Readmission Risk Interventions     No data to display

## 2023-07-06 NOTE — Progress Notes (Signed)
PROGRESS NOTE    Chloe Holmes  OZH:[id number] DOB: 30-Apr-1946 DOA: 07/05/2023 PCP: Creola Corn, MD   Brief Narrative: Chloe Holmes is a 77 y.o. female with a history of hypertension, hyperlipidemia, PVCs, anxiety, arthritis, hiatal hernia, GERD, obesity.  Patient presented secondary to a fall at home with associated aphasia concerning for stroke. Code stroke was activated with CT and MRI negative for acute stroke, but CT significant for left PCA severe stenosis. EEG ordered for evaluation of possible seizures. Patient was also found to have trauma with resultant facial fractures, involving the right orbit. ENT and ophthalmology consulted with initial recommendations for no inpatient management.   Assessment and Plan:  Aphasia Unclear etiology. Initial concern for possible stroke vs TBI/concussion vs epileptic aphasia. MRI brain negative for acute stroke. 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  -Neurology recommendations: pending  Facial fractures Secondary to trauma from fall. Acute fractures involve the right orbital floor and lamina papyracea; comminuted and displaced right floor fracture with herniated intraorbital fat. Patient also noted to have hemorrhage in the right maxillary sinus. Patient seen by ENT and ophthalmology with recommendations for no inpatient management at this time. Today, patient reports diplopia. Discussed with ENT, Dr. Elijah Birk who recommended re-consulting ophthalmology. Discussed with ophthalmology who recommended no acute intervention from their standpoint and to have patient follow-up in about 1 week with her ophthalmologist to allow swelling to  improve.  Leukocytosis WBC of 14,900 on admission, presumed secondary to acute stress. WBC 8,200. Resolved.  Hyponatremia Mild with a sodium of 134. Down to 133 likely related to D5 water IV fluids.  Hypokalemia -Potassium supplementation  Diastolic heart dysfunction Noted on Transthoracic Echocardiogram to have grade 1 diastolic heart dysfunction without clinical evidence of heart failure.  Hyperlipidemia -Continue atorvastatin  Obesity Estimated body mass index is 34.34 kg/m as calculated from the following:   Height as of this encounter: 5\' 5"  (1.651 m).   Weight as of this encounter: 93.6 kg.   DVT prophylaxis: Lovenox Code Status:   Code Status: Full Code Family Communication: None at bedside Disposition Plan: Discharge home pending ongoing workup/specialist recommendations   Consultants:  ENT Ophthalmology Neurology  Procedures:  EEG  Antimicrobials: None    Subjective: Patient reports ongoing diplopia, confirming she is seeing two of each object and not having blurry vision. Speaking better today.  Objective: BP (!) 148/76 (BP Location: Left Arm)   Pulse (!) 59   Temp 98 F (36.7 C) (Oral)   Resp 18   Ht 5\' 5"  (1.651 m)   Wt 93.6 kg   SpO2 100%   BMI 34.34 kg/m   Examination:  General exam: Appears calm and comfortable Respiratory system: Clear to auscultation. Respiratory effort normal. Cardiovascular system: S1 & S2 heard, RRR. No murmurs. Gastrointestinal system: Abdomen is nondistended, soft and nontender. Normal bowel sounds heard. Central nervous system: Alert and oriented. Mild expressive aphasia. Musculoskeletal: No edema. No calf tenderness Skin: No cyanosis. No rashes Psychiatry: Judgement and insight appear normal. Mood & affect appropriate.    Data Reviewed: I have personally reviewed following labs and imaging studies  CBC Lab Results  Component Value Date   WBC 8.2 07/06/2023   RBC 4.18 07/06/2023   HGB 12.4 07/06/2023    HCT 37.5 07/06/2023   MCV  89.7 07/06/2023   MCH 29.7 07/06/2023   PLT 191 07/06/2023   MCHC 33.1 07/06/2023   RDW 13.5 07/06/2023   LYMPHSABS 1.1 07/05/2023   MONOABS 0.6 07/05/2023   EOSABS 0.1 07/05/2023   BASOSABS 0.1 07/05/2023     Last metabolic panel Lab Results  Component Value Date   NA 133 (L) 07/06/2023   K 3.2 (L) 07/06/2023   CL 100 07/06/2023   CO2 20 (L) 07/06/2023   BUN 17 07/06/2023   CREATININE 0.85 07/06/2023   GLUCOSE 103 (H) 07/06/2023   GFRNONAA >60 07/06/2023   GFRAA >60 01/28/2019   CALCIUM 8.7 (L) 07/06/2023   PROT 6.1 (L) 07/06/2023   ALBUMIN 3.5 07/06/2023   BILITOT 0.6 07/06/2023   ALKPHOS 47 07/06/2023   AST 18 07/06/2023   ALT 16 07/06/2023   ANIONGAP 13 07/06/2023    GFR: Estimated Creatinine Clearance: 61.7 mL/min (by C-G formula based on SCr of 0.85 mg/dL).  No results found for this or any previous visit (from the past 240 hour(s)).    Radiology Studies: Overnight EEG with video  Result Date: 07/06/2023 Charlsie Quest, MD     07/06/2023  9:37 AM Patient Name: Chloe Holmes MRN: [id number] Epilepsy Attending: Charlsie Quest Referring Physician/Provider: Erick Blinks, MD Duration: 07/05/2023 1104 to 07/06/2023 0930 Patient history: 77 y.o. female presenting with fall and expressive greater than receptive aphasia. EEG to evaluate for seizure Level of alertness: Awake, asleep AEDs during EEG study: None Technical aspects: This EEG study was done with scalp electrodes positioned according to the 10-20 International system of electrode placement. Electrical activity was reviewed with band pass filter of 1-70Hz , sensitivity of 7 uV/mm, display speed of 83mm/sec with a 60Hz  notched filter applied as appropriate. EEG data were recorded continuously and digitally stored.  Video monitoring was available and reviewed as appropriate. Description: The posterior dominant rhythm consists of 8-9 Hz activity of moderate voltage (25-35 uV)  seen predominantly in posterior head regions, symmetric and reactive to eye opening and eye closing. Sleep was characterized by vertex waves, sleep spindles (12 to 14 Hz), maximal frontocentral region. EEG showed continuous 3 to 6 Hz theta-delta slowing in left hemisphere, maximal left temporal region. Hyperventilation and photic stimulation were not performed.   ABNORMALITY - Continuous slow, left hemisphere, maximal left temporal region IMPRESSION: This study is suggestive of cortical dysfunction arising from left hemisphere, maximal left temporal region likely secondary to underlying structural abnormality, post-ictal state. No seizures or epileptiform discharges were seen throughout the recording. Charlsie Quest   MR BRAIN WO CONTRAST  Result Date: 07/05/2023 CLINICAL DATA:  77 year old female code stroke presentation this morning, trauma with orbital floor fracture. Left PCA stenosis on CTA. EXAM: MRI HEAD WITHOUT CONTRAST TECHNIQUE: Multiplanar, multiecho pulse sequences of the brain and surrounding structures were obtained without intravenous contrast. COMPARISON:  Head CT, face CT, cervical spine CT, CTA and CTP this morning. FINDINGS: Brain: Somewhat noisy DWI.  No convincing diffusion restriction. Patchy and scattered bilateral cerebral white matter T2 and FLAIR hyperintensity in both hemispheres. Left lentiform perivascular space (normal variant). Deep gray matter nuclei relatively spared. Minimal involvement of the pons. Cerebellum appears spared. No cortical encephalomalacia or chronic cerebral blood products identified. No midline shift, mass effect, evidence of mass lesion, ventriculomegaly, extra-axial collection or acute intracranial hemorrhage. Cervicomedullary junction and pituitary are within normal limits. Vascular: Major intracranial vascular flow voids are preserved. Skull and upper cervical spine: Facial fractures detailed by CT. Normal background bone  marrow signal. Negative visible  cervical spine for age. Sinuses/Orbits: Right orbit fracture and associated soft tissue injury as per CT. Layering hemorrhage in the right maxillary sinus. Other: Right face hematoma/contusion/edema. Mastoids remain clear. Grossly normal visible internal auditory structures. IMPRESSION: 1. No acute infarct or acute intracranial abnormality identified. Moderate for age cerebral white matter signal changes. 2. Acute traumatic findings about the right face and orbit as per CT today. Electronically Signed   By: Odessa Fleming M.D.   On: 07/05/2023 10:59   CT CERVICAL SPINE WO CONTRAST  Result Date: 07/05/2023 CLINICAL DATA:  77 year old female. Code stroke. Fall. Abnormal head CT. EXAM: CT CERVICAL SPINE WITHOUT CONTRAST TECHNIQUE: Multidetector CT imaging of the cervical spine was performed without intravenous contrast. Multiplanar CT image reconstructions were also generated. RADIATION DOSE REDUCTION: This exam was performed according to the departmental dose-optimization program which includes automated exposure control, adjustment of the mA and/or kV according to patient size and/or use of iterative reconstruction technique. COMPARISON:  CT head and face today. CTA neck today reported separately. FINDINGS: Alignment: Straightening of lower cervical and mild reversal of upper cervical lordosis. Mild degenerative appearing anterolisthesis of C7 on T1. Bilateral posterior element alignment is within normal limits. Skull base and vertebrae: Visualized skull base is intact. No atlanto-occipital dissociation. C1 and C2 appear intact and aligned. No acute osseous abnormality identified. Soft tissues and spinal canal: No prevertebral fluid or swelling. No visible canal hematoma. Face reported separately. Negative noncontrast visible deep soft tissue spaces of the neck. Disc levels: Moderate to severe chronic cervical disc and endplate degeneration C3-C4 through C5-C6 superimposed on degenerative appearing C6-C7 ankylosis.  Widespread superimposed facet arthropathy bilaterally, although generally worse on the right. There is evidence of developing facet ankylosis on the right at C3-C4. There is mild to moderate spinal stenosis at C4-C5, and moderate to severe spinal stenosis at C5-C6 (series 4, image 62). Upper chest: Minimally included, see Neck CTA reported separately. IMPRESSION: 1. No acute traumatic injury identified in the cervical spine. 2. 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 Multilevel spinal stenosis, up to severe at C5-C6, with evidence of spinal cord mass effect. Electronically Signed   By: Odessa Fleming M.D.   On: 07/05/2023 05:11   CT Maxillofacial Wo Contrast  Result Date: 07/05/2023 CLINICAL DATA:  77 year old female. Code stroke. Fall. Abnormal head CT. EXAM: CT MAXILLOFACIAL WITHOUT CONTRAST TECHNIQUE: Multidetector CT imaging of the maxillofacial structures was performed. Multiplanar CT image reconstructions were also generated. RADIATION DOSE REDUCTION: This exam was performed according to the departmental dose-optimization program which includes automated exposure control, adjustment of the mA and/or kV according to patient size and/or use of iterative reconstruction technique. COMPARISON:  Head CT today reported separately. Prior face CT 10/10/2007. FINDINGS: Osseous: Severe left TMJ degeneration. Mandible intact and normally located. Right maxillary dentition periapical lucency but no complicating features. Left maxillary sinus walls are intact. Right maxillary sinus walls are intact but there is layering hemorrhage within the sinus. See orbit findings below. Bilateral zygoma and pterygoid bones appear intact. No acute nasal bone fracture. Central skull base intact. Cervical spine detailed separately. Orbits: Chronic left orbital floor fracture, improved compared to 2009. Acute right orbital floor fracture with comminution, herniated  intraorbital fat (series 6, image 50) and marginal herniation of the inferior rectus muscle on image 51. There is mild contusion, fat stranding in the inferior right orbit. No intraorbital hematoma. Mild right lamina papyracea fracture also. Other orbital  walls remain intact. Globes and other intraorbital soft tissues are within normal limits. There is additional right orbit preseptal, periorbital soft tissue hematoma/contusion which tracks to the forehead. Sinuses: Focal hemorrhage in a right ethmoid air cell and layering hemorrhage in the right maxillary sinus related to the orbit fractures. Other paranasal sinuses are well aerated. Tympanic cavities and mastoids are clear. Soft tissues: Negative visible noncontrast thyroid, larynx, pharynx, parapharyngeal spaces, retropharyngeal space, sublingual space, submandibular spaces, masticator and parotid spaces are within normal limits. Right anterior and lateral face roughly 5 cm area of superficial hematoma/contusion. This overlies the right zygomaticomaxillary confluence which appears to remain intact. No soft tissue gas. Limited intracranial: Stable to that reported separately. IMPRESSION: 1. Acute Right Orbital floor and lamina papyracea fractures. Comminuted and displaced right floor fracture with herniated intraorbital fat, marginally herniated inferior rectus muscle. Minimal intraorbital contusion associated. Hemorrhage in the right maxillary sinus. 2. No other acute facial fracture identified. Additional right face and periorbital superficial hematoma/contusion. 3. 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 Electronically Signed   By: Odessa Fleming M.D.   On: 07/05/2023 05:04   CT ANGIO HEAD NECK W WO CM (CODE STROKE)  Result Date: 07/05/2023 CLINICAL DATA:  77 year old female with aphasia.  Trauma. EXAM: CT ANGIOGRAPHY HEAD AND NECK CT PERFUSION BRAIN TECHNIQUE: Multidetector CT imaging of the head and neck was performed using the  standard protocol during bolus administration of intravenous contrast. Multiplanar CT image reconstructions and MIPs were obtained to evaluate the vascular anatomy. Carotid stenosis measurements (when applicable) are obtained utilizing NASCET criteria, using the distal internal carotid diameter as the denominator. Multiphase CT imaging of the brain was performed following IV bolus contrast injection. Subsequent parametric perfusion maps were calculated using RAPID software. RADIATION DOSE REDUCTION: This exam was performed according to the departmental dose-optimization program which includes automated exposure control, adjustment of the mA and/or kV according to patient size and/or use of iterative reconstruction technique. CONTRAST:  40mL OMNIPAQUE IOHEXOL 350 MG/ML SOLN; 75mL OMNIPAQUE IOHEXOL 350 MG/ML SOLN COMPARISON:  Head CT 0416 hours today. FINDINGS: CT Brain Perfusion Findings: ASPECTS: 10 CBF (<30%) Volume: 0mL.  All CBF in CBV parameters are negative. Perfusion (Tmax>6.0s) volume: 11mL.  Hypoperfusion index of 0 Mismatch Volume: Possibly 11mL Infarction Location:No core infarct detected, abnormal Tmax in the left PCA territory. See below. CTA NECK Skeleton: Face and cervical spine are detailed separately. Grossly intact visible upper thorax. Upper chest: Atelectasis. Mild retained gas and secretions in the thoracic esophagus which is not abnormally dilated. Visible central pulmonary arteries appear patent. Other neck: Superficial right face soft tissue injury reported separately. Aortic arch: Mild to moderate Calcified aortic atherosclerosis. Bovine arch configuration. Right carotid system: Tortuous brachiocephalic artery and right CCA origin with no significant plaque or stenosis. Significant right subclavian venous contrast streak artifact. Visible right CCA is normal. Minimal calcified plaque at the right carotid bifurcation. Retropharyngeal bifurcation and course of the right ICA with no significant  plaque or stenosis to the skull base. Tortuous vessel below the skull base. Left carotid system: Bovine left CCA origin is tortuous with no significant plaque or stenosis. No significant plaque or stenosis at the bifurcation. Tortuous left ICA to the skull base without stenosis. Vertebral arteries: Tortuous proximal right subclavian artery and normal right vertebral artery origin with no plaque or stenosis. Right vertebral appears somewhat dominant, patent and within normal limits to the skull base. Proximal left subclavian mild plaque and tortuosity without stenosis. Left vertebral artery origin  is normal. Tortuous left V1 segment. Mildly non dominant left vertebral artery appears patent to the skull base with no significant plaque or stenosis. CTA HEAD Posterior circulation: Patent distal vertebral arteries, PICA origins, vertebrobasilar junction without plaque or stenosis. Mildly dominant right V4. Patent basilar artery without stenosis. Patent SCA and PCA origins. Both posterior communicating arteries are present. The right PCA branches are within normal limits. There is a severe short segment left PCA P1 stenosis best demonstrated on series 7, images 145 and 146. This is just proximal to the left posterior communicating artery junction. But beyond that the left PCA branches are within normal limits. Anterior circulation: Both ICA siphons are patent. No significant siphon plaque or stenosis. Mild calcified plaque is greater on the right. Both posterior communicating and ophthalmic artery origins appear normal. Patent carotid termini. Normal MCA and ACA origins. Anterior communicating artery is mildly ectatic on series 7, image 156 but there is no discrete aneurysm at this time. Bilateral ACA branches are patent with mild irregularity, no significant stenosis. Right MCA M1 segment, right MCA trifurcation and right MCA branches are within normal limits. Left MCA M1 segment and trifurcation are patent without  stenosis, mildly ectatic. Left MCA branches are within normal limits. No branch occlusion or stenosis identified. Venous sinuses: Early contrast timing, not evaluated. Anatomic variants: Mildly dominant right vertebral artery. Review of the MIP images confirms the above findings IMPRESSION: 1. 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 But no core infarct., abnormal CBF or CBV on CT Perfusion. This was discussed by telephone with Dr. Erick Blinks on 07/05/2023 at 0438 hours. 2. CTA is negative for large vessel occlusion. Generalized vessel tortuosity in the head and neck with generally mild for age atherosclerosis. No other hemodynamically significant arterial stenosis. The anterior communicating artery is ectatic but without overt aneurysm. 3.  Aortic Atherosclerosis (ICD10-I70.0). Electronically Signed   By: Odessa Fleming M.D.   On: 07/05/2023 04:51   CT CEREBRAL PERFUSION W CONTRAST  Result Date: 07/05/2023 CLINICAL DATA:  77 year old female with aphasia.  Trauma. EXAM: CT ANGIOGRAPHY HEAD AND NECK CT PERFUSION BRAIN TECHNIQUE: Multidetector CT imaging of the head and neck was performed using the standard protocol during bolus administration of intravenous contrast. Multiplanar CT image reconstructions and MIPs were obtained to evaluate the vascular anatomy. Carotid stenosis measurements (when applicable) are obtained utilizing NASCET criteria, using the distal internal carotid diameter as the denominator. Multiphase CT imaging of the brain was performed following IV bolus contrast injection. Subsequent parametric perfusion maps were calculated using RAPID software. RADIATION DOSE REDUCTION: This exam was performed according to the departmental dose-optimization program which includes automated exposure control, adjustment of the mA and/or kV according to patient size and/or use of iterative reconstruction technique. CONTRAST:  40mL  OMNIPAQUE IOHEXOL 350 MG/ML SOLN; 75mL OMNIPAQUE IOHEXOL 350 MG/ML SOLN COMPARISON:  Head CT 0416 hours today. FINDINGS: CT Brain Perfusion Findings: ASPECTS: 10 CBF (<30%) Volume: 0mL.  All CBF in CBV parameters are negative. Perfusion (Tmax>6.0s) volume: 11mL.  Hypoperfusion index of 0 Mismatch Volume: Possibly 11mL Infarction Location:No core infarct detected, abnormal Tmax in the left PCA territory. See below. CTA NECK Skeleton: Face and cervical spine are detailed separately. Grossly intact visible upper thorax. Upper chest: Atelectasis. Mild retained gas and secretions in the thoracic esophagus which is not abnormally dilated. Visible central pulmonary arteries appear patent. Other neck: Superficial right face soft tissue injury reported separately. Aortic  arch: Mild to moderate Calcified aortic atherosclerosis. Bovine arch configuration. Right carotid system: Tortuous brachiocephalic artery and right CCA origin with no significant plaque or stenosis. Significant right subclavian venous contrast streak artifact. Visible right CCA is normal. Minimal calcified plaque at the right carotid bifurcation. Retropharyngeal bifurcation and course of the right ICA with no significant plaque or stenosis to the skull base. Tortuous vessel below the skull base. Left carotid system: Bovine left CCA origin is tortuous with no significant plaque or stenosis. No significant plaque or stenosis at the bifurcation. Tortuous left ICA to the skull base without stenosis. Vertebral arteries: Tortuous proximal right subclavian artery and normal right vertebral artery origin with no plaque or stenosis. Right vertebral appears somewhat dominant, patent and within normal limits to the skull base. Proximal left subclavian mild plaque and tortuosity without stenosis. Left vertebral artery origin is normal. Tortuous left V1 segment. Mildly non dominant left vertebral artery appears patent to the skull base with no significant plaque or  stenosis. CTA HEAD Posterior circulation: Patent distal vertebral arteries, PICA origins, vertebrobasilar junction without plaque or stenosis. Mildly dominant right V4. Patent basilar artery without stenosis. Patent SCA and PCA origins. Both posterior communicating arteries are present. The right PCA branches are within normal limits. There is a severe short segment left PCA P1 stenosis best demonstrated on series 7, images 145 and 146. This is just proximal to the left posterior communicating artery junction. But beyond that the left PCA branches are within normal limits. Anterior circulation: Both ICA siphons are patent. No significant siphon plaque or stenosis. Mild calcified plaque is greater on the right. Both posterior communicating and ophthalmic artery origins appear normal. Patent carotid termini. Normal MCA and ACA origins. Anterior communicating artery is mildly ectatic on series 7, image 156 but there is no discrete aneurysm at this time. Bilateral ACA branches are patent with mild irregularity, no significant stenosis. Right MCA M1 segment, right MCA trifurcation and right MCA branches are within normal limits. Left MCA M1 segment and trifurcation are patent without stenosis, mildly ectatic. Left MCA branches are within normal limits. No branch occlusion or stenosis identified. Venous sinuses: Early contrast timing, not evaluated. Anatomic variants: Mildly dominant right vertebral artery. Review of the MIP images confirms the above findings IMPRESSION: 1. 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 But no core infarct., abnormal CBF or CBV on CT Perfusion. This was discussed by telephone with Dr. Erick Blinks on 07/05/2023 at 0438 hours. 2. CTA is negative for large vessel occlusion. Generalized vessel tortuosity in the head and neck with generally mild for age atherosclerosis. No other hemodynamically significant arterial  stenosis. The anterior communicating artery is ectatic but without overt aneurysm. 3.  Aortic Atherosclerosis (ICD10-I70.0). Electronically Signed   By: Odessa Fleming M.D.   On: 07/05/2023 04:51   CT HEAD CODE STROKE WO CONTRAST  Result Date: 07/05/2023 CLINICAL DATA:  Code stroke. 77 year old female (not otherwise specified at the time of this report). EXAM: CT HEAD WITHOUT CONTRAST TECHNIQUE: Contiguous axial images were obtained from the base of the skull through the vertex without intravenous contrast. RADIATION DOSE REDUCTION: This exam was performed according to the departmental dose-optimization program which includes automated exposure control, adjustment of the mA and/or kV according to patient size and/or use of iterative reconstruction technique. COMPARISON:  Head CT 10/10/2007. FINDINGS: Brain: Cerebral volume is within normal limits for age. No midline shift, mass effect, or evidence  of intracranial mass lesion. No ventriculomegaly. No acute intracranial hemorrhage identified. Patchy asymmetric frontal horn and anterior white matter capsule hypodensity appears chronic and stable. No cortically based acute infarct identified. Left basal ganglia perivascular space (normal variant). Vascular: Calcified atherosclerosis at the skull base. No suspicious intracranial vascular hyperdensity. Skull: Acute appearing right orbital floor fracture. Chronic left orbital floor fracture. Calvarium appears intact. Chronic left TMJ degeneration. Sinuses/Orbits: Layering hemorrhage in the right maxillary sinus. Right orbital floor fracture associated and new since 2009. Other paranasal sinuses well aerated. Tympanic cavities and mastoids well aerated. Other: Right forehead, periorbital, pre malar soft tissue injury with hematoma/contusion. No soft tissue gas identified. No gaze deviation. ASPECTS [facility] Stroke Program Early CT Score) Total score (0-10 with 10 being normal): 10 IMPRESSION: 1. No acute cortically based  infarct or acute intracranial hemorrhage identified. ASPECTS 10. Chronic small vessel disease appears stable since 2009. 2. Acute traumatic findings including right orbital floor fracture, scalp and face soft tissue injury. Chronic left orbital floor fracture. 3. These results were communicated to Dr. Derry Lory at 4:24 am on 07/05/2023 by text page via the [facility] messaging system. Electronically Signed   By: Odessa Fleming M.D.   On: 07/05/2023 04:24      LOS: 1 day    Jacquelin Hawking, MD [facility] 07/06/2023, 12:44 PM   If 7PM-7AM, please contact night-coverage [email or web address]

## 2023-07-06 NOTE — Evaluation (Signed)
Speech Language Pathology Evaluation Patient Details Name: Chloe Holmes MRN: [id number] DOB: 1946/08/03 Today's Date: 07/06/2023 Time: 4098-1191 SLP Time Calculation (min) (ACUTE ONLY): 21 min  Problem List:  Patient Active Problem List   Diagnosis Date Noted   Aphasia 07/05/2023   Multiple facial fractures (HCC) 07/05/2023   Fall at home, initial encounter 07/05/2023   Leukocytosis 07/05/2023   History of palpitations 07/05/2023   Hyponatremia 07/05/2023   Diastolic dysfunction 07/05/2023   Hyperlipidemia 07/05/2023   Closed fracture of right orbital floor (HCC) 07/05/2023   S/P left TKA 01/27/2019   S/P total knee replacement, left 01/27/2019   Chest pain 06/01/2018   Obesity (BMI 30-39.9) 06/01/2018   Stenosis of cervix 11/30/2013   Fibroid uterus 11/24/2013   Arthritis    Hypertension    Essential hypertension 03/30/2009   Esophageal reflux 03/30/2009   DIVERTICULOSIS-COLON 03/30/2009   DYSPHAGIA 03/30/2009   ESOPHAGEAL STRICTURE 03/29/2009   HIATAL HERNIA WITH REFLUX 03/29/2009   Dysphagia 03/29/2009   Past Medical History:  Past Medical History:  Diagnosis Date   Arthritis    Diverticulosis    Dyspnea    At pre-op , pt  (a retired Insurance underwriter) reports past Hx of SOB that she found out was being caused by frequent PVCs, she believes the PVCs were related to her poor tolerance of daily statin medication regimen ; however reports resolution of SOB now that she is only taking statin three times a week and taking bystolic to manage dysrhythmia.   GERD (gastroesophageal reflux disease)    History of hiatal hernia    HLD (hyperlipidemia)    Hypertension    Past Surgical History:  Past Surgical History:  Procedure Laterality Date   ABDOMINAL HYSTERECTOMY     CERVICAL DISC SURGERY     COLONOSCOPY  2016   DILATION AND CURETTAGE OF UTERUS     ESOPHAGEAL MANOMETRY N/A 10/03/2013   Procedure: ESOPHAGEAL MANOMETRY (EM);  Surgeon: Beverley Fiedler, MD;  Location: [facility]  ENDOSCOPY;  Service: Gastroenterology;  Laterality: N/A;   EYE SURGERY     bilateral cataracts    ORBITAL FRACTURE SURGERY     TOTAL KNEE ARTHROPLASTY Left 01/27/2019   Procedure: TOTAL KNEE ARTHROPLASTY;  Surgeon: Durene Romans, MD;  Location: [facility] ORS;  Service: Orthopedics;  Laterality: Left;  70 mins   HPI:  Chloe Holmes is a 77 yo female presenting to ED 11/10 s/p fall with AMS and aphasia. CTH negative for hemorrhage, but does show a R orbital fx. CTA with stenosis of L PCA segment but perfusion study is negative. EEG shows cortical dysfunction from L hemisphere likely secondary to structural abnormality, post-ictal state. W/u for acute CVA vs TBI/concussion vs epileptic aphasia. PMH includes HTN, HLD, PVCs, anxiety, arthritis, hiatal hernia, GERD   Assessment / Plan / Recommendation Clinical Impression  Pt reports living alone at being independent with all ADLs PTA. She states her language has improved significantly since previous date, although has not returned to baseline. Pt scored 23/30 on the SLUMS (a score of 27 or above is considered WFL) characterized by deficits related to sustained attention, memory, and problem solving. She has good awareness of her difficulties and makes attempts at self-correction. Note intermittent phonemic paraphasias and mild word-retrieval difficulty throughout the session. Given pt's PLOF, recommend continuing intensive SLP f/u both acutely and upon d/c.    SLP Assessment  SLP Recommendation/Assessment: Patient needs continued Speech Lanaguage Pathology Services SLP Visit Diagnosis: Aphasia (R47.01);Cognitive communication deficit (R41.841)  Recommendations for follow up therapy are one component of a multi-disciplinary discharge planning process, led by the attending physician.  Recommendations may be updated based on patient status, additional functional criteria and insurance authorization.    Follow Up Recommendations  Acute inpatient rehab  (3hours/day)    Assistance Recommended at Discharge  Frequent or constant Supervision/Assistance  Functional Status Assessment Patient has had a recent decline in their functional status and demonstrates the ability to make significant improvements in function in a reasonable and predictable amount of time.  Frequency and Duration min 2x/week  2 weeks      SLP Evaluation Cognition  Overall Cognitive Status: Impaired/Different from baseline Arousal/Alertness: Awake/alert Orientation Level: Oriented to person;Oriented to place;Oriented to situation;Disoriented to time Attention: Sustained Sustained Attention: Impaired Sustained Attention Impairment: Verbal basic Memory: Impaired Memory Impairment: Retrieval deficit Awareness: Appears intact Problem Solving: Impaired Problem Solving Impairment: Verbal basic       Comprehension  Auditory Comprehension Overall Auditory Comprehension: Appears within functional limits for tasks assessed    Expression Expression Primary Mode of Expression: Verbal Verbal Expression Overall Verbal Expression: Impaired Naming: Impairment Divergent: 25-49% accurate Written Expression Dominant Hand: Right   Oral / Motor  Oral Motor/Sensory Function Overall Oral Motor/Sensory Function: Within functional limits Motor Speech Overall Motor Speech: Appears within functional limits for tasks assessed            Gwynneth Aliment, M.A., CF-SLP Speech Language Pathology, Acute Rehabilitation Services  Secure Chat preferred [phone]  07/06/2023, 11:00 AM

## 2023-07-06 NOTE — Progress Notes (Addendum)
Level of consciousness improved overnight; patient requesting swallow test; mouth is very dry; repeated yale swallow screen and passed; will notify MD and ST to f/u; some word repeating persists but communicating appropriately her needs.

## 2023-07-06 NOTE — Progress Notes (Signed)
RN called and asked if we had DC orders for LTM and I explained we do not have orders she did have a LTM duplicate order that was canceled.

## 2023-07-07 DIAGNOSIS — R4701 Aphasia: Secondary | ICD-10-CM | POA: Diagnosis not present

## 2023-07-07 DIAGNOSIS — R569 Unspecified convulsions: Secondary | ICD-10-CM | POA: Diagnosis not present

## 2023-07-07 LAB — POTASSIUM: Potassium: 3.9 mmol/L (ref 3.5–5.1)

## 2023-07-07 NOTE — Progress Notes (Signed)
   07/07/23 2214  Provider Notification  Provider Name/Title Dr Wilford Corner  Date Provider Notified 07/07/23  Time Provider Notified 2200  Method of Notification Page  Notification Reason Other (Comment) (pt c/o of visual disturbances(seeing tiny branches))  Provider response See new orders  Date of Provider Response 07/07/23  Time of Provider Response 2214

## 2023-07-07 NOTE — Progress Notes (Signed)
Physical Therapy Treatment Patient Details Name: Chloe Holmes MRN: [id number] DOB: [birth year] Today's Date: 07/07/2023   History of Present Illness Pt is a 77 y.o. female who presented 07/05/23 s/p fall with AMS and aphasia. CT head negative for hemorrhage. Does show a right orbital floor fracture. She is out of the window for TNK. 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 Pt being worked up for acute stroke vs TBI/concussion vs epileptic aphasia. PMH: GERD, HTN, HLD    PT Comments  Pt greeted resting in bed and agreeable to session. Pt with continuous EEG running limiting mobility distance however pt demonstrating good progress towards acute goals. Pt requiring CGA for bed mobility for safety, min A for transfers and up to mod A during gait with RW for support. Pt with x2 LOB during gait needing min A to maintain balance. Pt with noted knee instability vs buckling during gait with pt reporting LE weakness. Pt needing cues throughout mobility for safety as pt demonstrating decreased insight into deficits, moving quickly with decreased safety awareness. Current plan remains appropriate to address deficits and maximize functional independence and decrease caregiver burden. Pt continues to benefit from skilled PT services to progress toward functional mobility goals.      If plan is discharge home, recommend the following: A lot of help with walking and/or transfers;A lot of help with bathing/dressing/bathroom;Assistance with cooking/housework;Direct supervision/assist for medications management;Direct supervision/assist for financial management;Assist for transportation;Help with stairs or ramp for entrance   Can travel by private vehicle        Equipment Recommendations  Rolling walker (2 wheels);BSC/3in1    Recommendations for Other Services       Precautions / Restrictions Precautions Precautions: Fall Precaution Comments: questionable R  vision/inattention? Restrictions Weight Bearing Restrictions: No     Mobility  Bed Mobility Overal bed mobility: Needs Assistance Bed Mobility: Supine to Sit, Sit to Supine     Supine to sit: Contact guard Sit to supine: Contact guard assist   General bed mobility comments: Extra time, CGA for safety    Transfers Overall transfer level: Needs assistance Equipment used: Rolling walker (2 wheels), None Transfers: Sit to/from Stand Sit to Stand: Min assist, Contact guard assist           General transfer comment: min A without AD (unsteady upon standing.) CGA with AD from bed and toilet    Ambulation/Gait Ambulation/Gait assistance: Min assist, Mod assist Gait Distance (Feet): 40 Feet (x2 (limited by EEG cord)) Assistive device: Rolling walker (2 wheels) Gait Pattern/deviations: Step-through pattern, Decreased step length - right, Decreased step length - left, Drifts right/left, Trunk flexed Gait velocity: reduced     General Gait Details: Pt tends to bump into obstacles on her R. Pt required min-modA for balance and directing her around obstacles.noted knee flexion with pt endorsing LEs feeling weak   Stairs             Wheelchair Mobility     Tilt Bed    Modified Rankin (Stroke Patients Only) Modified Rankin (Stroke Patients Only) Pre-Morbid Rankin Score: No symptoms Modified Rankin: Moderately severe disability     Balance Overall balance assessment: Needs assistance Sitting-balance support: No upper extremity supported, Feet supported Sitting balance-Leahy Scale: Fair Sitting balance - Comments: Unable to reach to feet to donn socks when cued, supervision for static sitting balance EOB   Standing balance support: Single extremity supported, During functional activity Standing balance-Leahy Scale: Fair Standing balance comment: able to  groom in standing at the sink without UE support                            Cognition Arousal:  Alert Behavior During Therapy: WFL for tasks assessed/performed Overall Cognitive Status: Impaired/Different from baseline Area of Impairment: Attention, Awareness, Problem solving                   Current Attention Level: Selective Memory: Decreased short-term memory, Decreased recall of precautions Following Commands: Follows one step commands consistently, Follows one step commands with increased time, Follows multi-step commands inconsistently Safety/Judgement: Decreased awareness of safety, Decreased awareness of deficits Awareness: Emergent Problem Solving: Slow processing, Requires verbal cues General Comments: oriented x4, following all commands. able to remember several events, conversations and people from yesterday when she was "confused." noteable work finding difficulty and decreased safety awareness        Exercises      General Comments General comments (skin integrity, edema, etc.): VSS, LT EEG, faily present      Pertinent Vitals/Pain Pain Assessment Pain Assessment: No/denies pain Pain Intervention(s): Monitored during session    Home Living                          Prior Function            PT Goals (current goals can now be found in the care plan section) Acute Rehab PT Goals Patient Stated Goal: to improve PT Goal Formulation: With patient Time For Goal Achievement: 07/19/23 Progress towards PT goals: Progressing toward goals    Frequency    Min 1X/week      PT Plan      Co-evaluation              AM-PAC PT "6 Clicks" Mobility   Outcome Measure  Help needed turning from your back to your side while in a flat bed without using bedrails?: A Little Help needed moving from lying on your back to sitting on the side of a flat bed without using bedrails?: A Little Help needed moving to and from a bed to a chair (including a wheelchair)?: A Little Help needed standing up from a chair using your arms (e.g., wheelchair or  bedside chair)?: A Little Help needed to walk in hospital room?: A Lot Help needed climbing 3-5 steps with a railing? : Total 6 Click Score: 15    End of Session Equipment Utilized During Treatment: Gait belt Activity Tolerance: Patient tolerated treatment well Patient left: in bed;with call bell/phone within reach;with family/visitor present Nurse Communication: Mobility status PT Visit Diagnosis: Unsteadiness on feet (R26.81);Other abnormalities of gait and mobility (R26.89);Difficulty in walking, not elsewhere classified (R26.2);Other symptoms and signs involving the nervous system (R29.898);Muscle weakness (generalized) (M62.81)     Time: 4696-2952 PT Time Calculation (min) (ACUTE ONLY): 24 min  Charges:    $Gait Training: 8-22 mins $Therapeutic Activity: 8-22 mins PT General Charges $$ ACUTE PT VISIT: 1 Visit                     Chloe Holmes R. PTA Acute Rehabilitation Services Office: [phone]   Chloe Holmes 07/07/2023, 10:40 AM

## 2023-07-07 NOTE — Plan of Care (Signed)
LTM EEG report for this morning:  Intermittent slow, left temporal region. This study is suggestive of cortical dysfunction arising from the left temporal region. No seizures or epileptiform discharges were seen throughout the recording.   A/R: 77 y.o. female who presents after having a fall and hitting her head followed by aphasia. MRI brain was negative for acute finding. Now back to baseline. This event was likely a seizure and with EEG findings most likely due to postictal state.  - Repeat LTM with continued left temporal slowing. No electrographic seizures or epileptiform discharges seen.  - Continue Keppra 500 mg BID - Discontinuing LTM EEG.  - Outpatient seizure precautions should be explained to the patient prior to discharge. Restrictions include no driving until seizure free for 6 months.  - Outpatient Neurology follow up - Neurohospitalist service will sign off. Please call if there are additional questions.   Electronically signed: Dr. Caryl Pina

## 2023-07-07 NOTE — Progress Notes (Signed)
LTM EEG discontinued - no skin breakdown at unhook.   

## 2023-07-07 NOTE — Plan of Care (Signed)
Called by RN for visual disturbances. Repeat CTH Will follow the scan. If persistent, will increase keppra and repeat EEG in the AM   -- Milon Dikes, MD Neurologist [facility]

## 2023-07-07 NOTE — Progress Notes (Signed)
Occupational Therapy Treatment Patient Details Name: Chloe Holmes MRN: [id number] DOB: [birth year] Today's Date: 07/07/2023   History of present illness Pt is a 77 y.o. female who presented 07/05/23 s/p fall with AMS and aphasia. CT head negative for hemorrhage. Does show a right orbital floor fracture. She is out of the window for TNK. 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 Pt being worked up for acute stroke vs TBI/concussion vs epileptic aphasia. PMH: GERD, HTN, HLD   OT comments  Pt is making solid progress towards their acute OT goals. Pt remains limited by diplopia, impaired cognition, unsteady gait and safety. Pt attempted to ambulate without occlusion glasses or AD, she needed min A and was overtly unsteady and reporting dizziness. Mod cues needed to problem solve getting occlusion glasses and RW to improve safety with immediate improvement in dizziness and stability. Pt perseverated throughout on diplopia vs hallucination that occurred this am with medication - maximal cues to redirect throughout. Pt over and undershooting at sink without occlusion glasses donned, reinforced importance of occlusion glasses. OT to continue to follow acutely to facilitate progress towards established goals. Pt will continue to benefit from intensive inpatient follow up therapy, >3 hours/day after discharge.        If plan is discharge home, recommend the following:  A little help with walking and/or transfers;A little help with bathing/dressing/bathroom;Assistance with cooking/housework;Direct supervision/assist for medications management;Direct supervision/assist for financial management;Assist for transportation;Help with stairs or ramp for entrance   Equipment Recommendations  Other (comment)    Recommendations for Other Services Rehab consult    Precautions / Restrictions Precautions Precautions: Fall Precaution Comments: diplopia, EEG Restrictions Weight  Bearing Restrictions: No       Mobility Bed Mobility Overal bed mobility: Needs Assistance Bed Mobility: Supine to Sit, Sit to Supine     Supine to sit: Contact guard Sit to supine: Contact guard assist   General bed mobility comments: cues for impulsivity due to EEG    Transfers Overall transfer level: Needs assistance Equipment used: Rolling walker (2 wheels), None Transfers: Sit to/from Stand Sit to Stand: Min assist, Contact guard assist           General transfer comment: min A without AD adn without occulision glasses; CGA with RW and occlusion glasses donned.     Balance Overall balance assessment: Needs assistance Sitting-balance support: No upper extremity supported, Feet supported Sitting balance-Leahy Scale: Fair     Standing balance support: Single extremity supported, During functional activity Standing balance-Leahy Scale: Fair                   ADL either performed or assessed with clinical judgement   ADL Overall ADL's : Needs assistance/impaired     Grooming: Contact guard assist;Standing Grooming Details (indicate cue type and reason): standing at the sink with occlusion glasses donned. with glasses doffed pt was overshooting all targets - needed cues to problem solve through impiarment (to don glasses)                 Toilet Transfer: Contact guard Marine scientist Details (indicate cue type and reason): Pt initally attempting to walk without occlusion glasses and without AD, pt extremely dizzy and unsteady. needed education on importance to use AD and don occlusion glasses with immediate improvement in dizziness         Functional mobility during ADLs: Contact guard assist;Rolling walker (2 wheels);Cueing for safety;Cueing for sequencing General ADL Comments: impaired  insight, impulsive, problem solving, safety awareness and needs cues.    Extremity/Trunk Assessment Upper Extremity Assessment Upper Extremity  Assessment: Overall WFL for tasks assessed   Lower Extremity Assessment Lower Extremity Assessment: Defer to PT evaluation        Vision   Vision Assessment?: Vision impaired- to be further tested in functional context;Yes Diplopia Assessment: Disappears with one eye closed Depth Perception: Overshoots;Undershoots (with occlusion glasses doffed) Additional Comments: re-educated on occlusion strategy. significant improvement with nasal portion of R eye occluded   Perception Perception Perception: Impaired   Praxis Praxis Praxis: WFL    Cognition Arousal: Alert Behavior During Therapy: WFL for tasks assessed/performed Overall Cognitive Status: Impaired/Different from baseline Area of Impairment: Attention, Memory, Following commands, Safety/judgement, Awareness, Problem solving                   Current Attention Level: Selective Memory: Decreased short-term memory Following Commands: Follows one step commands consistently, Follows multi-step commands inconsistently Safety/Judgement: Decreased awareness of safety, Decreased awareness of deficits Awareness: Emergent Problem Solving: Slow processing, Requires verbal cues General Comments: overall WFL for basic orientation and ADLs - impulsive throughout, limited insight to deficits and problem solving. Pt attempting to move without occlusion glasses despite dizziness and diplopia. needs max cues to don glasses with immediate improvement. perseverated on strage diplopia even that occured this am - agreeabel to AIR now after education              General Comments VSS, EEG intact, family present    Pertinent Vitals/ Pain       Pain Assessment Pain Assessment: No/denies pain Pain Intervention(s): Monitored during session   Frequency  Min 1X/week        Progress Toward Goals  OT Goals(current goals can now be found in the care plan section)  Progress towards OT goals: Progressing toward goals  Acute Rehab OT  Goals Patient Stated Goal: to talk to neurologist OT Goal Formulation: With patient Time For Goal Achievement: 07/20/23 Potential to Achieve Goals: Good ADL Goals Pt Will Perform Grooming: Independently;standing Pt Will Perform Lower Body Dressing: Independently;sit to/from stand Pt Will Transfer to Toilet: Independently;ambulating Additional ADL Goal #1: pt will independently complete IADL medication management task without errors Additional ADL Goal #2: Pt will indep utilize visual occlusion tehcniuqes to manage diplopia during functional tasks and mobility   AM-PAC OT "6 Clicks" Daily Activity     Outcome Measure   Help from another person eating meals?: None Help from another person taking care of personal grooming?: A Little Help from another person toileting, which includes using toliet, bedpan, or urinal?: A Little Help from another person bathing (including washing, rinsing, drying)?: A Little Help from another person to put on and taking off regular upper body clothing?: A Little Help from another person to put on and taking off regular lower body clothing?: A Little 6 Click Score: 19    End of Session Equipment Utilized During Treatment: Gait belt  OT Visit Diagnosis: Unsteadiness on feet (R26.81);Muscle weakness (generalized) (M62.81);History of falling (Z91.81)   Activity Tolerance Patient tolerated treatment well   Patient Left in bed;with call bell/phone within reach;with bed alarm set;with family/visitor present   Nurse Communication Mobility status        Time: 1129-1201 OT Time Calculation (min): 32 min  Charges: OT General Charges $OT Visit: 1 Visit OT Treatments $Self Care/Home Management : 8-22 mins $Therapeutic Activity: 8-22 mins  Derenda Mis, OTR/L Acute Rehabilitation Services Office [phone] Secure Chat  Communication Preferred   Donia Pounds 07/07/2023, 1:12 PM

## 2023-07-07 NOTE — Plan of Care (Signed)

## 2023-07-07 NOTE — Procedures (Signed)
Patient Name: Zaakirah Dalomba  MRN: [id number]  Epilepsy Attending: Charlsie Quest  Referring Physician/Provider: Erick Blinks, MD  Duration: 07/06/2023 1104 to 07/07/2023 1104   Patient history: 77 y.o. female presenting with fall and expressive greater than receptive aphasia. EEG to evaluate for seizure   Level of alertness: Awake, asleep   AEDs during EEG study: LEV   Technical aspects: This EEG study was done with scalp electrodes positioned according to the 10-20 International system of electrode placement. Electrical activity was reviewed with band pass filter of 1-70Hz , sensitivity of 7 uV/mm, display speed of 29mm/sec with a 60Hz  notched filter applied as appropriate. EEG data were recorded continuously and digitally stored.  Video monitoring was available and reviewed as appropriate.   Description: The posterior dominant rhythm consists of 8-9 Hz activity of moderate voltage (25-35 uV) seen predominantly in posterior head regions, symmetric and reactive to eye opening and eye closing. Sleep was characterized by vertex waves, sleep spindles (12 to 14 Hz), maximal frontocentral region. EEG showed intermittent 3 to 6 Hz theta-delta slowing in left temporal region. Hyperventilation and photic stimulation were not performed.      ABNORMALITY - Intermittent slow, left temporal region   IMPRESSION: This study is suggestive of cortical dysfunction arising from left temporal region. No seizures or epileptiform discharges were seen throughout the recording.   Angelisse Riso Annabelle Harman

## 2023-07-07 NOTE — Plan of Care (Signed)
  Problem: Education: Goal: Knowledge of disease or condition will improve Outcome: Progressing Goal: Knowledge of secondary prevention will improve (MUST DOCUMENT ALL) Outcome: Progressing Goal: Knowledge of patient specific risk factors will improve Elta [place] N/A or DELETE if not current risk factor) Outcome: Progressing   Problem: Ischemic Stroke/TIA Tissue Perfusion: Goal: Complications of ischemic stroke/TIA will be minimized Outcome: Progressing   Problem: Coping: Goal: Will verbalize positive feelings about self Outcome: Progressing Goal: Will identify appropriate support needs Outcome: Progressing

## 2023-07-07 NOTE — Progress Notes (Signed)
Inpatient Rehab Coordinator Note:  I met with patient, her son, and son's s/o, at bedside to discuss CIR recommendations and goals/expectations of CIR stay.  We reviewed 3 hrs/day of therapy, physician follow up, and average length of stay 2 weeks (dependent upon progress) with goals of supervision to mod I.  We discussed potential CIR stay if she still needs it once medically ready.  Pt does note some visual impairments/hallucinations?.  EEG still running today.  We will follow.   Estill Dooms, PT, DPT Admissions Coordinator [phone] 07/07/23  11:40 AM

## 2023-07-07 NOTE — Progress Notes (Signed)
CT called per order, said they are busy and will call back. [place], [place] Epple Efe

## 2023-07-07 NOTE — Progress Notes (Addendum)
Patient son and Daughter in law at bedside reported patient was having some visual disturbances. Assessment otherwise unchanged, but patient reports that instead of blue paint on wall she was seeing blue wall paper with red and pink abstract squiggles. Had patient visualize with and without glasses, in different levels of lighting and with just one eye at a time and patient reported still seeing disturbances. MD paged.

## 2023-07-07 NOTE — Progress Notes (Signed)
PROGRESS NOTE    Chloe Holmes  ZOX:[id number] DOB: [birth year] DOA: 07/05/2023 PCP: Creola Corn, MD   Brief Narrative: Aryana Mcgary is a 77 y.o. female with a history of hypertension, hyperlipidemia, PVCs, anxiety, arthritis, hiatal hernia, GERD, obesity.  Patient presented secondary to a fall at home with associated aphasia concerning for stroke. Code stroke was activated with CT and MRI negative for acute stroke, but CT significant for left PCA severe stenosis. EEG ordered for evaluation of possible seizures. Patient was also found to have trauma with resultant facial fractures, involving the right orbit. ENT and ophthalmology consulted with initial recommendations for no inpatient management.   Assessment and Plan:  Aphasia Unclear etiology. Initial concern for possible stroke vs TBI/concussion vs epileptic aphasia. MRI brain negative for acute stroke. 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  -Neurology recommendations: pending pending today  Facial fractures Secondary to trauma from fall. Acute fractures involve the right orbital floor and lamina papyracea; comminuted and displaced right floor fracture with herniated intraorbital fat. Patient also noted to have hemorrhage in the right maxillary sinus. Patient seen by ENT and ophthalmology with recommendations for no inpatient management at this time. Today, patient reports diplopia. Discussed with ENT, Dr. Elijah Birk who recommended re-consulting ophthalmology. Discussed with ophthalmology who recommended no acute intervention from their standpoint and to have patient follow-up in about 1 week with her  ophthalmologist to allow swelling to improve.  Leukocytosis WBC of 14,900 on admission, presumed secondary to acute stress. WBC 8,200. Resolved.  Hyponatremia Mild with a sodium of 134. Down to 133 likely related to D5 water IV fluids.  Hypokalemia Resolved with potassium supplementation.  Diastolic heart dysfunction Noted on Transthoracic Echocardiogram to have grade 1 diastolic heart dysfunction without clinical evidence of heart failure.  Hyperlipidemia -Continue atorvastatin  Obesity Estimated body mass index is 34.34 kg/m as calculated from the following:   Height as of this encounter: 5\' 5"  (1.651 m).   Weight as of this encounter: 93.6 kg.   DVT prophylaxis: Lovenox Code Status:   Code Status: Full Code Family Communication: None at bedside Disposition Plan: Discharge to CIR pending ongoing workup/specialist recommendations   Consultants:  ENT Ophthalmology Neurology  Procedures:  EEG  Antimicrobials: None    Subjective: Continued diplopia but this has improved. No other concerns.  Objective: BP (!) 153/72 (BP Location: Left Arm)   Pulse (!) 57   Temp 98.4 F (36.9 C) (Oral)   Resp 18   Ht 5\' 5"  (1.651 m)   Wt 93.6 kg   SpO2 99%   BMI 34.34 kg/m   Examination:  General exam: Appears calm and comfortable Respiratory system: Clear to auscultation. Respiratory effort normal. Cardiovascular system: S1 & S2 heard, RRR. No murmurs. Gastrointestinal system: Abdomen is nondistended, soft and nontender. Normal bowel sounds heard. Central nervous system: Alert and oriented. No focal neurological deficits.   Data Reviewed: I have personally reviewed following labs and imaging studies  CBC Lab Results  Component Value Date   WBC 8.2 07/06/2023   RBC 4.18 07/06/2023   HGB 12.4 07/06/2023   HCT 37.5 07/06/2023   MCV 89.7 07/06/2023   MCH 29.7 07/06/2023   PLT 191 07/06/2023   MCHC 33.1 07/06/2023   RDW 13.5 07/06/2023  LYMPHSABS 1.1 07/05/2023    MONOABS 0.6 07/05/2023   EOSABS 0.1 07/05/2023   BASOSABS 0.1 07/05/2023     Last metabolic panel Lab Results  Component Value Date   NA 133 (L) 07/06/2023   K 3.9 07/07/2023   CL 100 07/06/2023   CO2 20 (L) 07/06/2023   BUN 17 07/06/2023   CREATININE 0.85 07/06/2023   GLUCOSE 103 (H) 07/06/2023   GFRNONAA >60 07/06/2023   GFRAA >60 01/28/2019   CALCIUM 8.7 (L) 07/06/2023   PROT 6.1 (L) 07/06/2023   ALBUMIN 3.5 07/06/2023   BILITOT 0.6 07/06/2023   ALKPHOS 47 07/06/2023   AST 18 07/06/2023   ALT 16 07/06/2023   ANIONGAP 13 07/06/2023    GFR: Estimated Creatinine Clearance: 61.7 mL/min (by C-G formula based on SCr of 0.85 mg/dL).  No results found for this or any previous visit (from the past 240 hour(s)).    Radiology Studies: Overnight EEG with video  Result Date: 07/06/2023 Charlsie Quest, MD     07/07/2023  9:27 AM Patient Name: Chloe Holmes MRN: [id number] Epilepsy Attending: Charlsie Quest Referring Physician/Provider: Erick Blinks, MD Duration: 07/05/2023 1104 to 07/06/2023 1104 Patient history: 77 y.o. female presenting with fall and expressive greater than receptive aphasia. EEG to evaluate for seizure Level of alertness: Awake, asleep AEDs during EEG study: None Technical aspects: This EEG study was done with scalp electrodes positioned according to the 10-20 International system of electrode placement. Electrical activity was reviewed with band pass filter of 1-70Hz , sensitivity of 7 uV/mm, display speed of 55mm/sec with a 60Hz  notched filter applied as appropriate. EEG data were recorded continuously and digitally stored.  Video monitoring was available and reviewed as appropriate. Description: The posterior dominant rhythm consists of 8-9 Hz activity of moderate voltage (25-35 uV) seen predominantly in posterior head regions, symmetric and reactive to eye opening and eye closing. Sleep was characterized by vertex waves, sleep spindles (12 to 14  Hz), maximal frontocentral region. EEG showed continuous 3 to 6 Hz theta-delta slowing in left hemisphere, maximal left temporal region. Hyperventilation and photic stimulation were not performed.   ABNORMALITY - Continuous slow, left hemisphere, maximal left temporal region IMPRESSION: This study is suggestive of cortical dysfunction arising from left hemisphere, maximal left temporal region likely secondary to underlying structural abnormality, post-ictal state. No seizures or epileptiform discharges were seen throughout the recording. Charlsie Quest      LOS: 2 days    Jacquelin Hawking, MD [facility] 07/07/2023, 12:58 PM   If 7PM-7AM, please contact night-coverage [email or web address]

## 2023-07-08 ENCOUNTER — Inpatient Hospital Stay (HOSPITAL_COMMUNITY): Payer: Medicare Other

## 2023-07-08 DIAGNOSIS — R4701 Aphasia: Secondary | ICD-10-CM | POA: Diagnosis not present

## 2023-07-08 DIAGNOSIS — R569 Unspecified convulsions: Secondary | ICD-10-CM | POA: Diagnosis not present

## 2023-07-08 LAB — BASIC METABOLIC PANEL
Anion gap: 9 (ref 5–15)
BUN: 20 mg/dL (ref 8–23)
CO2: 21 mmol/L — ABNORMAL LOW (ref 22–32)
Calcium: 8.7 mg/dL — ABNORMAL LOW (ref 8.9–10.3)
Chloride: 108 mmol/L (ref 98–111)
Creatinine, Ser: 0.91 mg/dL (ref 0.44–1.00)
GFR, Estimated: >60 mL/min (ref >60)
Glucose, Bld: 98 mg/dL (ref 70–99)
Potassium: 3.9 mmol/L (ref 3.5–5.1)
Sodium: 138 mmol/L (ref 135–145)

## 2023-07-08 LAB — HEPATIC FUNCTION PANEL
ALT: 14 U/L (ref 0–44)
AST: 18 U/L (ref 15–41)
Albumin: 3.6 g/dL (ref 3.5–5.0)
Alkaline Phosphatase: 58 U/L (ref 38–126)
Bilirubin, Direct: 0.1 mg/dL (ref 0.0–0.2)
Indirect Bilirubin: 0.4 mg/dL (ref 0.3–0.9)
Total Bilirubin: 0.5 mg/dL (ref <1.2)
Total Protein: 6.1 g/dL — ABNORMAL LOW (ref 6.5–8.1)

## 2023-07-08 LAB — C-REACTIVE PROTEIN: CRP: 0.9 mg/dL (ref <1.0)

## 2023-07-08 LAB — SEDIMENTATION RATE: Sed Rate: 10 mm/h (ref 0–22)

## 2023-07-08 LAB — AMMONIA: Ammonia: 17 umol/L (ref 9–35)

## 2023-07-08 MED ORDER — ASPIRIN 81 MG PO TBEC
81.0000 mg | DELAYED_RELEASE_TABLET | Freq: Every day | ORAL | Status: DC
  Administered 2023-07-08 – 2023-07-09 (×2): 81 mg via ORAL
  Filled 2023-07-08 (×2): qty 1

## 2023-07-08 MED ORDER — DIVALPROEX SODIUM 250 MG PO DR TAB
500.0000 mg | DELAYED_RELEASE_TABLET | Freq: Three times a day (TID) | ORAL | Status: DC
  Administered 2023-07-09 (×2): 500 mg via ORAL
  Filled 2023-07-08 (×2): qty 2

## 2023-07-08 MED ORDER — GADOBUTROL 1 MMOL/ML IV SOLN
10.0000 mL | Freq: Once | INTRAVENOUS | Status: AC | PRN
  Administered 2023-07-08: 10 mL via INTRAVENOUS

## 2023-07-08 MED ORDER — VALPROATE SODIUM 100 MG/ML IV SOLN
1800.0000 mg | Freq: Every day | INTRAVENOUS | Status: AC
  Administered 2023-07-08: 1800 mg via INTRAVENOUS
  Filled 2023-07-08: qty 18

## 2023-07-08 MED ORDER — CLOPIDOGREL BISULFATE 75 MG PO TABS
75.0000 mg | ORAL_TABLET | Freq: Every day | ORAL | Status: DC
  Administered 2023-07-08 – 2023-07-09 (×2): 75 mg via ORAL
  Filled 2023-07-08 (×2): qty 1

## 2023-07-08 NOTE — Progress Notes (Signed)
Physical Therapy Treatment Patient Details Name: Chloe Holmes MRN: [id number] DOB: 03/24/1946 Today's Date: 07/08/2023   History of Present Illness Pt is a 77 y.o. female who presented 07/05/23 s/p fall with AMS and aphasia. CT head negative for hemorrhage. Does show a right orbital floor fracture. She is out of the window for TNK. 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 Pt being worked up for acute stroke vs TBI/concussion vs epileptic aphasia. PMH: GERD, HTN, HLD    PT Comments  Pt greeted resting in bed and agreeable to session with continued progress towards acute goals. Pt continues to endorse visual disturbances, most noted in low light and with eyes closed, however still present with eyes open, seeing squiggly lines/vines, rivers and bright colors pt described as like a fabric. Pt demonstrating bed mobility with CGA for safety as pt with some continued impulsivity attempting to come to sitting EOB with tray table over bed blocking EOB. Pt requiring min A for safe transfers and gati with rollator support to maintain balance as pt with noted postural sway throughout, although no overt LOB noted. Pt able to accept mild balance challenges in rhomberg stance with eyes closed without LOB. Neurology NP arriving at end of session and son present throughout. . Current plan remains appropriate to address deficits and maximize functional independence and decrease caregiver burden. Pt continues to benefit from skilled PT services to progress toward functional mobility goals.     If plan is discharge home, recommend the following: A lot of help with walking and/or transfers;A lot of help with bathing/dressing/bathroom;Assistance with cooking/housework;Direct supervision/assist for medications management;Direct supervision/assist for financial management;Assist for transportation;Help with stairs or ramp for entrance   Can travel by private vehicle        Equipment  Recommendations  Rolling walker (2 wheels);BSC/3in1    Recommendations for Other Services       Precautions / Restrictions Precautions Precautions: Fall Precaution Comments: diplopia Restrictions Weight Bearing Restrictions: No     Mobility  Bed Mobility Overal bed mobility: Needs Assistance Bed Mobility: Supine to Sit, Sit to Supine     Supine to sit: Contact guard Sit to supine: Contact guard assist   General bed mobility comments: cues due to impulsity, attempting to come to sitting with tray table across bed and lap    Transfers Overall transfer level: Needs assistance Equipment used: Rollator (4 wheels) Transfers: Sit to/from Stand Sit to Stand: Min assist           General transfer comment: light min A to steady on rise, from EOB and rollator seat x1    Ambulation/Gait Ambulation/Gait assistance: Min assist Gait Distance (Feet): 200 Feet (x2) Assistive device: Rollator (4 wheels) Gait Pattern/deviations: Step-through pattern, Decreased step length - right, Decreased step length - left, Drifts right/left, Trunk flexed Gait velocity: reduced     General Gait Details: no instances of bumping into objects, veering R slightly hugging R side of hall, increased postural sway throughout   Stairs             Wheelchair Mobility     Tilt Bed    Modified Rankin (Stroke Patients Only) Modified Rankin (Stroke Patients Only) Pre-Morbid Rankin Score: No symptoms Modified Rankin: Moderately severe disability     Balance Overall balance assessment: Needs assistance Sitting-balance support: No upper extremity supported, Feet supported Sitting balance-Leahy Scale: Fair Sitting balance - Comments: Unable to reach to feet to donn socks when cued, supervision for static sitting  balance EOB   Standing balance support: Single extremity supported, During functional activity Standing balance-Leahy Scale: Fair Standing balance comment: able to maintain static  standing without UE support           Rhomberg - Eyes Closed: 60 (able to maintain without LOB whil gesturing about visual distrurbances with eyes closed)   High Level Balance Comments: able to accept some low level balance challenges, weaving around obstacles with increased cues to complete            Cognition Arousal: Alert Behavior During Therapy: WFL for tasks assessed/performed Overall Cognitive Status: Impaired/Different from baseline Area of Impairment: Attention, Memory, Following commands, Safety/judgement, Awareness, Problem solving                   Current Attention Level: Selective Memory: Decreased short-term memory Following Commands: Follows one step commands consistently, Follows multi-step commands inconsistently Safety/Judgement: Decreased awareness of safety, Decreased awareness of deficits Awareness: Emergent Problem Solving: Slow processing, Requires verbal cues General Comments: overall WFL for basic orientation impulsive throughout, limited insight to deficits and problem solving.        Exercises      General Comments General comments (skin integrity, edema, etc.): VSS, pt son present and supportive      Pertinent Vitals/Pain Pain Assessment Pain Assessment: Faces Faces Pain Scale: Hurts a little bit Pain Location: bil feet Pain Descriptors / Indicators: Sore Pain Intervention(s): Monitored during session, Limited activity within patient's tolerance    Home Living                          Prior Function            PT Goals (current goals can now be found in the care plan section) Acute Rehab PT Goals Patient Stated Goal: to improve PT Goal Formulation: With patient Time For Goal Achievement: 07/19/23 Progress towards PT goals: Progressing toward goals    Frequency    Min 1X/week      PT Plan      Co-evaluation              AM-PAC PT "6 Clicks" Mobility   Outcome Measure  Help needed turning  from your back to your side while in a flat bed without using bedrails?: A Little Help needed moving from lying on your back to sitting on the side of a flat bed without using bedrails?: A Little Help needed moving to and from a bed to a chair (including a wheelchair)?: A Little Help needed standing up from a chair using your arms (e.g., wheelchair or bedside chair)?: A Little Help needed to walk in hospital room?: A Little Help needed climbing 3-5 steps with a railing? : Total 6 Click Score: 16    End of Session Equipment Utilized During Treatment: Gait belt Activity Tolerance: Patient tolerated treatment well Patient left: in bed;with call bell/phone within reach;with family/visitor present;Other (comment) (with Neuro NP present) Nurse Communication: Mobility status PT Visit Diagnosis: Unsteadiness on feet (R26.81);Other abnormalities of gait and mobility (R26.89);Difficulty in walking, not elsewhere classified (R26.2);Other symptoms and signs involving the nervous system (R29.898);Muscle weakness (generalized) (M62.81)     Time: 4098-1191 PT Time Calculation (min) (ACUTE ONLY): 23 min  Charges:    $Gait Training: 8-22 mins $Therapeutic Activity: 8-22 mins PT General Charges $$ ACUTE PT VISIT: 1 Visit  Lenora Boys. PTA Acute Rehabilitation Services Office: [phone]   Catalina Antigua 07/08/2023, 2:11 PM

## 2023-07-08 NOTE — Progress Notes (Signed)
PROGRESS NOTE    Chloe Holmes  ZHY:[id number] DOB: 04/22/1946 DOA: 07/05/2023 PCP: Creola Corn, MD  Chief Complaint  Patient presents with   Code Stroke    Brief Narrative:   Chloe Holmes is Chloe Holmes 77 y.o. female with Chloe Holmes history of hypertension, hyperlipidemia, PVCs, anxiety, arthritis, hiatal hernia, GERD, obesity. Patient presented secondary to Chloe Holmes fall at home with associated aphasia concerning for stroke. Code stroke was activated with CT and MRI negative for acute stroke, but CT significant for left PCA severe stenosis. EEG ordered for evaluation of possible seizures. Patient was also found to have trauma with resultant facial fractures, involving the right orbit. ENT and ophthalmology consulted with initial recommendations for no inpatient management.   Assessment & Plan:   Principal Problem:   Aphasia Active Problems:   Multiple facial fractures (HCC)   Fall at home, initial encounter   Leukocytosis   History of palpitations   Hyponatremia   Diastolic dysfunction   Hyperlipidemia   Obesity (BMI 30-39.9)   Closed fracture of right orbital floor (HCC)   Diplopia   Hypokalemia  Aphasia Unclear etiology. Initial concern for possible stroke vs TBI/concussion vs epileptic aphasia. MRI brain negative for acute stroke. EEG ordered overnight without seizures or epileptiform discharges, but was significant for left temporal region cortical dysfunction suggesting likely underlying structural abnormality/post-ictal state -Neurology recommendations: EEG with findings suggestive of cortical dysfunction arising from L hemisphere, maximal L temporal region likely secondary to underlying structural abnormality, post ictal state.   -repeat MRI as below, neuro recommendations pending today   Visual Disturbances  Diplopia  Plan for reeval by ophtho after ~1 week Patient had visual disturbances overnight -> neuro repeat CT (no acute intracranial abnormality).  MRI with punctate stroke  involving the L parietal cortex (possibly incidental).   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  -Neurology recommendations: pending today   Facial fractures Secondary to trauma from fall. Acute fractures involve the right orbital floor and lamina papyracea; comminuted and displaced right floor fracture with herniated intraorbital fat. Patient also noted to have hemorrhage in the right maxillary sinus. Patient seen by ENT and ophthalmology with recommendations for no inpatient management at this time.  patient reported diplopia 11/12 (as well as 11/13). Dr. Mal Misty discussed with ENT, Dr. Elijah Birk (11/12) who recommended re-consulting ophthalmology. Dr. Mal Misty discussed with ophthalmology (11/12) who recommended no acute intervention from their standpoint and to have patient follow-up in about 1 week with her ophthalmologist to allow swelling to improve. As she'll discharge to CIR, she'll likely need these consults called back at about 1 weeks time while there   Leukocytosis resolved   Hyponatremia resolved   Hypokalemia Resolved with potassium supplementation.   Diastolic heart dysfunction Noted on Transthoracic Echocardiogram to have grade 1 diastolic heart dysfunction without clinical evidence of heart failure.   Hyperlipidemia -Continue atorvastatin   Obesity Body mass index is 34.34 kg/m.    DVT prophylaxis: lovenox Code Status: full Family Communication: son and dil Disposition:   Status is: Inpatient Remains inpatient appropriate because: need for continued inpatient care   Consultants:  Neurology Ophthalmology ENT  Procedures:  none  Antimicrobials:  Anti-infectives (From admission, onward)    None       Subjective: Asking some questions with regards to her diagnosis  Son and dil at bedside  Objective: Vitals:   07/08/23 0426 07/08/23 0731 07/08/23 1136 07/08/23 1509  BP: (!) 142/70  120/72 (!) 155/77 (!) 155/70  Pulse: (!) 56 62 61 65  Resp: 18 18 18 18   Temp: 98.5 F (36.9 C) 97.9 F (36.6 C) 98.2 F (36.8 C) 98.8 F (37.1 C)  TempSrc: Oral Oral Oral Oral  SpO2: 97% 98% 98% 98%  Weight:      Height:        Intake/Output Summary (Last 24 hours) at 07/08/2023 1645 Last data filed at 07/08/2023 1224 Gross per 24 hour  Intake 400 ml  Output --  Net 400 ml   Filed Weights   07/05/23 0400 07/05/23 1600  Weight: 95 kg 93.6 kg    Examination:  General exam: Appears calm and comfortable  Respiratory system: unlabored Central nervous system: Alert and oriented. issues with FNF on L due to diplopia.   Extremities: no LEE    Data Reviewed: I have personally reviewed following labs and imaging studies  CBC: Recent Labs  Lab 07/05/23 0406 07/05/23 0410 07/06/23 0532  WBC 14.9*  --  8.2  NEUTROABS 13.1*  --   --   HGB 13.7 15.3* 12.4  HCT 41.9 45.0 37.5  MCV 92.3  --  89.7  PLT 202  --  191    Basic Metabolic Panel: Recent Labs  Lab 07/05/23 0406 07/05/23 0410 07/06/23 0532 07/07/23 0902 07/08/23 0438  NA 134* 133* 133*  --  138  K 3.6 3.5 3.2* 3.9 3.9  CL 100 100 100  --  108  CO2 20*  --  20*  --  21*  GLUCOSE 133* 132* 103*  --  98  BUN 31* 31* 17  --  20  CREATININE 0.88 0.90 0.85  --  0.91  CALCIUM 9.6  --  8.7*  --  8.7*    GFR: Estimated Creatinine Clearance: 57.6 mL/min (by C-G formula based on SCr of 0.91 mg/dL).  Liver Function Tests: Recent Labs  Lab 07/05/23 0406 07/06/23 0532  AST 26 18  ALT 21 16  ALKPHOS 58 47  BILITOT 0.9 0.6  PROT 7.2 6.1*  ALBUMIN 4.2 3.5    CBG: Recent Labs  Lab 07/05/23 1621 07/06/23 0140  GLUCAP 86 98     No results found for this or any previous visit (from the past 240 hour(s)).       Radiology Studies: MR BRAIN W WO CONTRAST  Result Date: 07/08/2023 CLINICAL DATA:  Meningitis/CNS infection suspected. Visual hallucinations. Recent fall. Expressive aphasia. EXAM: MRI  HEAD WITHOUT AND WITH CONTRAST TECHNIQUE: Multiplanar, multiecho pulse sequences of the brain and surrounding structures were obtained without and with intravenous contrast. CONTRAST:  10mL GADAVIST GADOBUTROL 1 MMOL/ML IV SOLN COMPARISON:  CT earlier same day.  MRI 3 days ago. FINDINGS: Brain: Chronic small-vessel ischemic changes affect pons. No focal cerebellar insult. Cerebral hemispheres show moderate chronic small-vessel ischemic changes of the white matter. Punctate acute infarction affecting the left parietal cortex. As an isolated finding, this could be incidental. No evidence of mass, hemorrhage, hydrocephalus or extra-axial collection. After contrast administration, no abnormal brain or leptomeningeal enhancement occurs. Vascular: Major vessels at the base of the brain show flow. Skull and upper cervical spine: Negative Sinuses/Orbits: Sinuses are clear. Known orbital blowout fracture of the orbital floor on the right. Other: None IMPRESSION: 1. Punctate (1 mm) acute infarction affecting the left parietal cortex. As an isolated finding, this could be incidental. 2. Chronic small-vessel ischemic changes of the pons and cerebral hemispheric white matter. 3. Known orbital blowout fracture of the orbital floor  on the right. 4. No imaging finding to suggest the diagnosis of meningitis. Electronically Signed   By: Paulina Fusi M.D.   On: 07/08/2023 14:26   CT HEAD WO CONTRAST ( )  Result Date: 07/08/2023 CLINICAL DATA:  Follow-up examination for stroke. EXAM: CT HEAD WITHOUT CONTRAST TECHNIQUE: Contiguous axial images were obtained from the base of the skull through the vertex without intravenous contrast. RADIATION DOSE REDUCTION: This exam was performed according to the departmental dose-optimization program which includes automated exposure control, adjustment of the mA and/or kV according to patient size and/or use of iterative reconstruction technique. COMPARISON:  Prior MRI 07/05/2023. FINDINGS:  Brain: Cerebral volume within normal limits. Patchy hypodensity involving the supratentorial cerebral white matter, consistent with chronic microvascular ischemic disease, moderate in nature. No acute intracranial hemorrhage. No acute large vessel territory infarct. No mass lesion or midline shift. No hydrocephalus or extra-axial fluid collection. Vascular: No abnormal hyperdense vessel. Skull: Scalp soft tissues demonstrate no acute finding. Calvarium intact. Sinuses/Orbits: Posttraumatic findings about the partially visualized right face and orbit again noted. No new finding. Paranasal sinuses are otherwise largely clear. Mastoid air cells and middle ear cavities are well pneumatized and free of fluid. 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 IMPRESSION: 1. No acute intracranial abnormality. 2. Moderate chronic microvascular ischemic disease. 3. Posttraumatic findings about the partially visualized right face and orbit, described on recent maxillofacial CT. Electronically Signed   By: Rise Mu M.D.   On: 07/08/2023 06:27        Scheduled Meds:  enoxaparin (LOVENOX) injection  40 mg Subcutaneous Q24H   folic acid  1 mg Oral Daily   multivitamin with minerals  1 tablet Oral Daily   thiamine  100 mg Oral Daily   Or   thiamine  100 mg Intravenous Daily   Continuous Infusions:   LOS: 3 days    Time spent: over 30 min    Lacretia Nicks, MD [facility]   To contact the attending provider between 7A-7P or the covering provider during after hours 7P-7A, please log into the web site [email or web address] and access using universal [place] password for that web site. If you do not have the password, please call the hospital operator.  07/08/2023, 4:45 PM

## 2023-07-08 NOTE — Care Management Important Message (Signed)
Important Message  Patient Details  Name: Chloe Holmes MRN: [id number] Date of Birth: 28-Dec-1945   Important Message Given:  Yes - Medicare IM     Dorena Bodo 07/08/2023, 3:11 PM

## 2023-07-08 NOTE — Procedures (Signed)
Patient Name: Noami Amor  MRN: [id number]  Epilepsy Attending: Charlsie Quest  Referring Physician/Provider: Erick Blinks, MD  Duration: 07/07/2023 1104 to 07/07/2023 1436   Patient history: 77 y.o. female presenting with fall and expressive greater than receptive aphasia. EEG to evaluate for seizure   Level of alertness: Awake, asleep   AEDs during EEG study: LEV   Technical aspects: This EEG study was done with scalp electrodes positioned according to the 10-20 International system of electrode placement. Electrical activity was reviewed with band pass filter of 1-70Hz , sensitivity of 7 uV/mm, display speed of 61mm/sec with a 60Hz  notched filter applied as appropriate. EEG data were recorded continuously and digitally stored.  Video monitoring was available and reviewed as appropriate.   Description: The posterior dominant rhythm consists of 8-9 Hz activity of moderate voltage (25-35 uV) seen predominantly in posterior head regions, symmetric and reactive to eye opening and eye closing. Sleep was characterized by vertex waves, sleep spindles (12 to 14 Hz), maximal frontocentral region. EEG showed intermittent 3 to 6 Hz theta-delta slowing in left temporal region. Hyperventilation and photic stimulation were not performed.      ABNORMALITY - Intermittent slow, left temporal region   IMPRESSION: This study is suggestive of cortical dysfunction arising from left temporal region. No seizures or epileptiform discharges were seen throughout the recording.   Quanta Robertshaw Annabelle Harman

## 2023-07-08 NOTE — Progress Notes (Signed)
Inpatient Rehab Admissions Coordinator:   Per Dr. Lowell Guitar, further workup required.  MRI ordered for today, which has not be done at the time of this note.  We will continue to follow, unlikely for admit today unless MRI completed soon and no further workup recommended.   Estill Dooms, PT, DPT Admissions Coordinator [phone] 07/08/23  12:51 PM

## 2023-07-08 NOTE — Care Management Important Message (Signed)
Important Message  Patient Details  Name: Chloe Holmes MRN: [id number] Date of Birth: 1946/01/18   Important Message Given:  Yes - Medicare IM     Dorena Bodo 07/08/2023, 3:13 PM

## 2023-07-08 NOTE — Progress Notes (Signed)
NEUROLOGY CONSULT FOLLOW UP NOTE   Date of service: July 08, 2023 Patient Name: Chloe Holmes MRN:  [id number] DOB:  Jul 11, 1946  Brief HPI  Chloe Holmes is a 77 y.o. female  has a past medical history of Arthritis, Diverticulosis, Dyspnea, GERD (gastroesophageal reflux disease), History of hiatal hernia, HLD (hyperlipidemia), and Hypertension. who presented with  fall hitting the front of her head following which she was aphasic.     Interval Hx/subjective  Patient is awake and alert in NAD. She endorses having  a headache. She states she is much improved today as yesterday she was not able to speak. She is able to relate some history of what happened but unsure of certain things, such as why she was outside hollering for help and did not call for help while in the house. She is on LTM. LTM yesterday and today with Continuous slow, left hemisphere, maximal left temporal region. Due to EEG findings, her event was likely due to a seizure. Placed on Keppra 500 mg BID   Patient seen walking with PT, using a walker, from hallway into room. NIH: 1 for decreased visual fields. Denies pain, headache, dizziness, SOB. Continues to endorse intermittent double vision and hallucinations of small vessels/branches and the wall looking like wallpaper. This is seen when both eyes are open and with either eye when only that one is open. EOMI. Hallucinations do not change with eye movement. Able to close and open both eyes without issues.   Vitals   Vitals:   07/08/23 0039 07/08/23 0426 07/08/23 0731 07/08/23 1136  BP: (!) 135/57 (!) 142/70 120/72 (!) 155/77  Pulse: (!) 57 (!) 56 62 61  Resp: 18 18 18 18   Temp: 98.4 F (36.9 C) 98.5 F (36.9 C) 97.9 F (36.6 C) 98.2 F (36.8 C)  TempSrc: Oral Oral Oral Oral  SpO2: 97% 97% 98% 98%  Weight:      Height:         Body mass index is 34.34 kg/m.  Physical Exam   Constitutional: Appears well-developed and well-nourished.  Psych: Affect  appropriate to situation.  Eyes: No scleral injection.  HENT: No OP obstrucion. Bruising around left eye and left chin Head: Normocephalic.  Cardiovascular: Normal rate and regular rhythm.  Respiratory: Effort normal, non-labored breathing.  GI: Soft.  No distension. There is no tenderness.  Skin: WDI.   Neurologic Examination    Temp:  [97.5 F (36.4 C)-98.5 F (36.9 C)] 98.2 F (36.8 C) (11/13 1136) Pulse Rate:  [56-70] 61 (11/13 1136) Resp:  [18] 18 (11/13 1136) BP: (120-155)/(57-77) 155/77 (11/13 1136) SpO2:  [97 %-99 %] 98 % (11/13 1136)  General - Well nourished, well developed, in no apparent distress. Cardiovascular - Regular rhythm and rate.  Mental Status -  Level of arousal and orientation to time, place, and person were intact.  Language including expression, naming, repetition, comprehension was assessed and found intact . Attention span and concentration were normal. Recent and remote memory were intact .  Cranial Nerves II - XII - II - Visual fields without glasses, decreased midline vision on both eyes. Subjectively states she is having intermittent double vision and having trouble reading. Hallucinations of "vessels/branches" seen with both eyes.  III, IV, VI - Extraocular movements intact . V - Facial sensation intact bilaterally . VII - Facial movement intact bilaterally . VIII - Hearing & vestibular intact bilaterally . X - Palate elevates symmetrically . XI - Chin turning & shoulder shrug intact  bilaterally . XII - Tongue protrusion intact .  Motor Strength - The patient's strength was normal in all extremities and pronator drift was absent.  Bulk was normal and fasciculations were absent .   Motor Tone - Muscle tone was assessed at the neck and appendages and was normal . Sensory - Light touch, temperature/pinprick were assessed and were symmetrical.   Coordination - The patient had normal movements in the hands and feet with no ataxia or dysmetria.   Tremor was absent. Gait and Station - steady with walker  Labs and Diagnostic Imaging   CBC:  Recent Labs  Lab 07/05/23 0406 07/05/23 0410 07/06/23 0532  WBC 14.9*  --  8.2  NEUTROABS 13.1*  --   --   HGB 13.7 15.3* 12.4  HCT 41.9 45.0 37.5  MCV 92.3  --  89.7  PLT 202  --  191    Basic Metabolic Panel:  Lab Results  Component Value Date   NA 138 07/08/2023   K 3.9 07/08/2023   CO2 21 (L) 07/08/2023   GLUCOSE 98 07/08/2023   BUN 20 07/08/2023   CREATININE 0.91 07/08/2023   CALCIUM 8.7 (L) 07/08/2023   GFRNONAA >60 07/08/2023   GFRAA >60 01/28/2019   Lipid Panel:  Lab Results  Component Value Date   LDLCALC 94 07/05/2023   HgbA1c:  Lab Results  Component Value Date   HGBA1C 5.2 07/05/2023   Urine Drug Screen:     Component Value Date/Time   LABOPIA NONE DETECTED 07/05/2023 1525   COCAINSCRNUR NONE DETECTED 07/05/2023 1525   LABBENZ NONE DETECTED 07/05/2023 1525   AMPHETMU NONE DETECTED 07/05/2023 1525   THCU NONE DETECTED 07/05/2023 1525   LABBARB NONE DETECTED 07/05/2023 1525    Alcohol Level     Component Value Date/Time   ETH <10 07/05/2023 0406   INR  Lab Results  Component Value Date   INR 1.0 07/05/2023   APTT  Lab Results  Component Value Date   APTT 28 07/05/2023   AED levels: No results found for: "PHENYTOIN", "ZONISAMIDE", "LAMOTRIGINE", "LEVETIRACETA"  AST: 18 ALT: 14 Ammonia: 17  CT Head without contrast(Personally reviewed): No acute process, aspects 10   CT angio Head and Neck with contrast(Personally reviewed): 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  Chronic small-vessel ischemic changes of pons and cerebral white matter No imaging finding to support diagnosis of meningitis.   LTM EEG 11/11:  This study is suggestive of cortical dysfunction arising from left hemisphere, maximal left temporal region likely secondary to underlying structural abnormality, post-ictal state. No seizures or epileptiform discharges were seen throughout the recording.  LTM 11/12: This study is suggestive of cortical dysfunction arising from left temporal region. No seizures or epileptiform discharges were seen throughout the recording.      Impression  Chloe Holmes is a 77 y.o. female who presents after having a fall and hitting her head followed by aphasia. Patient remembers falling, stating she tripped over her feet trying to get to her phone that she had dropped. She presented in an encephalopathy/post-ictal type state. Initial MRI brain was negative for acute finding. Patient now endorses hallucinations of vessels or branches since last night. While it is rare, hallucinations can be a side effect of Keppra. We will change this to Depakote to  see if this change corrects the hallucinations. We ordered a repeat MRI with and without contrast. Results show a punctate acute infarction in the left parietal cortex. No imaging finding to support diagnosis of meningitis - Overall impression: - Fall with probable new onset seizure given left hemispheric slowing on EEG - Acute punctate left parietal cortex stroke   Recommendations  - Continue Seizure precautions  - Discontinue Keppra - Depakote 20mg /kg load, then maintenance dose of 5mg /kg Q8H - Frequent Neuro checks per stroke unit protocol - Vascular imaging - CTA completed 11/10 - TTE - Lipid panel - Statin - will be started if LDL>70 or otherwise medically indicated - A1C - Antithrombotic - Aspirin and Plavix for 3 weeks, then aspirin alone - DVT ppx - currently on Lovenox - Smoking cessation - will counsel patient - SBP goal - <220, PRN labetalol if HR>60 and PRN Hydralazine if HR<60 - Telemetry monitoring for arrhythmia - 72h - Swallow screen -  will be performed prior to PO intake - Stroke education - will be given - PT/OT/SLP  ______________________________________________________________________   Thank you for the opportunity to take part in the care of this patient. If you have any further questions, please contact the neurology consultation team on call. Updated oncall schedule is listed on AMION.   Pt seen by Neuro NP/APP and later by MD. Note/plan to be edited by MD as needed.    Lynnae January, DNP, AGACNP-BC [facility] Please use AMION for contact information & EPIC for messaging.   Electronically signed: Dr. Caryl Pina

## 2023-07-08 NOTE — Progress Notes (Deleted)
Eeg order currently unreleased

## 2023-07-08 NOTE — H&P (Incomplete)
Physical Medicine and Rehabilitation Admission H&P    Chief Complaint  Patient presents with   Functional deficits due to stroke     HPI:  Chloe Holmes is a 77 year old female with history of HTN, DOE, OA who was admitted on 11/10/245 after fall and EMS alerted by her apple watch. She was found in her backyard with expressive> receptive aphasia, noted to have word salad and unclear as to LKW.  UDS negative. CTA was negative for LVO and CT/P positive for short segment left PCI P1 stenosis but no core infarct. Neurology questioned stroke v/s TBI v/ epileptic aphasia and she was loaded with Keppra. MRI brain revealed right orbital fracture with soft tissue injury and no acute infarct or intracranial abnormality noted. She continued to have issues with confusion, agitation and bouts of lethargy. She was placed on LT-EEG which showed continuous slowing in left hemisphere maximal in left temporal region and findings felt to be likely due to seizure.   ENT/Dr. Elijah Birk consulted and no intervention recommended unless diplopia or entrapment occurs. Dr. Sun/ophthalmology and globe exam anatomically normal but visual exam limited by aphasia.     ROS   Past Medical History:  Diagnosis Date   Arthritis    Diverticulosis    Dyspnea    At pre-op , pt  (a retired Insurance underwriter) reports past Hx of SOB that she found out was being caused by frequent PVCs, she believes the PVCs were related to her poor tolerance of daily statin medication regimen ; however reports resolution of SOB now that she is only taking statin three times a week and taking bystolic to manage dysrhythmia.   GERD (gastroesophageal reflux disease)    History of hiatal hernia    HLD (hyperlipidemia)    Hypertension     Past Surgical History:  Procedure Laterality Date   ABDOMINAL HYSTERECTOMY     CERVICAL DISC SURGERY     COLONOSCOPY  2016   DILATION AND CURETTAGE OF UTERUS     ESOPHAGEAL MANOMETRY N/A 10/03/2013   Procedure:  ESOPHAGEAL MANOMETRY (EM);  Surgeon: Beverley Fiedler, MD;  Location: [facility];  Service: Gastroenterology;  Laterality: N/A;   EYE SURGERY     bilateral cataracts    ORBITAL FRACTURE SURGERY     TOTAL KNEE ARTHROPLASTY Left 01/27/2019   Procedure: TOTAL KNEE ARTHROPLASTY;  Surgeon: Durene Romans, MD;  Location: [facility];  Service: Orthopedics;  Laterality: Left;  70 mins    Family History  Problem Relation Age of Onset   Heart disease Mother    Cancer Mother        colon   Colon cancer Mother    Hypertension Father    Heart disease Father    Lung cancer Father    Cancer Father        lung   Stomach cancer Paternal Grandfather    Esophageal cancer Neg Hx    Rectal cancer Neg Hx    Breast cancer Neg Hx     Social History:  reports that she has never smoked. She has never used smokeless tobacco. She reports current alcohol use of about 7.0 - 10.0 standard drinks of alcohol per week. She reports that she does not use drugs.   Allergies  Allergen Reactions   Morphine Other (See Comments)    Hypotension; had to be narcan'd . This was 40 years ago   Pantoprazole Cough    Medications Prior to Admission  Medication Sig Dispense  Refill   atorvastatin (LIPITOR) 10 MG tablet Take 10 mg by mouth See admin instructions. Take 1 tablet by mouth 4 times a week.     celecoxib (CELEBREX) 200 MG capsule Take 200 mg by mouth daily.     diclofenac (CATAFLAM) 50 MG tablet Take 50 mg by mouth 2 (two) times daily.     hydrochlorothiazide (HYDRODIURIL) 25 MG tablet Take 25 mg by mouth daily.     LINZESS 290 MCG CAPS capsule Take 1 capsule (290 mcg total) by mouth daily before breakfast. (Patient taking differently: Take 290 mcg by mouth daily before breakfast.) 30 capsule 3   nebivolol (BYSTOLIC) 10 MG tablet Take 10 mg by mouth daily.     omeprazole (PRILOSEC OTC) 20 MG tablet Take 20 mg by mouth daily.        Home: Home Living Family/patient expects to be discharged to:: Private  residence Living Arrangements: Alone Available Help at Discharge: Family, Available 24 hours/day Type of Home: House Home Access: Stairs to enter Secretary/administrator of Steps: 1 Home Layout: One level Bathroom Shower/Tub: Health visitor: Standard Additional Comments: may need to confirm info due to aphasia  Lives With: Alone   Functional History: Prior Function Prior Level of Function : Independent/Modified Independent, Driving, Patient poor historian/Family not available Mobility Comments: no AD ADLs Comments: indep, drives, cooks, cleans  Functional Status:  Mobility: Bed Mobility Overal bed mobility: Needs Assistance Bed Mobility: Supine to Sit, Sit to Supine Supine to sit: Contact guard Sit to supine: Contact guard assist General bed mobility comments: cues due to impulsity, attempting to come to sitting with tray table across bed and lap Transfers Overall transfer level: Needs assistance Equipment used: Rollator (4 wheels) Transfers: Sit to/from Stand Sit to Stand: Min assist General transfer comment: light min A to steady on rise, from EOB and rollator seat x1 Ambulation/Gait Ambulation/Gait assistance: Min assist Gait Distance (Feet): 200 Feet (x2) Assistive device: Rollator (4 wheels) Gait Pattern/deviations: Step-through pattern, Decreased step length - right, Decreased step length - left, Drifts right/left, Trunk flexed General Gait Details: no instances of bumping into objects, veering R slightly hugging R side of hall, increased postural sway throughout Gait velocity: reduced Gait velocity interpretation: <1.31 ft/sec, indicative of household ambulator    ADL: ADL Overall ADL's : Needs assistance/impaired Eating/Feeding: Independent Grooming: Contact guard assist, Standing Grooming Details (indicate cue type and reason): standing at the sink with occlusion glasses donned. with glasses doffed pt was overshooting all targets - needed cues to  problem solve through impiarment (to don glasses) Upper Body Bathing: Set up, Sitting Lower Body Bathing: Contact guard assist, Sit to/from stand Upper Body Dressing : Set up, Sitting Lower Body Dressing: Contact guard assist Toilet Transfer: Contact guard assist, Ambulation Toilet Transfer Details (indicate cue type and reason): Pt initally attempting to walk without occlusion glasses and without AD, pt extremely dizzy and unsteady. needed education on importance to use AD and don occlusion glasses with immediate improvement in dizziness Toileting- Clothing Manipulation and Hygiene: Supervision/safety, Sit to/from stand Functional mobility during ADLs: Contact guard assist, Rolling walker (2 wheels), Cueing for safety, Cueing for sequencing General ADL Comments: impaired insight, impulsive, problem solving, safety awareness and needs cues.  Cognition: Cognition Overall Cognitive Status: Impaired/Different from baseline Arousal/Alertness: Awake/alert Orientation Level: Oriented X4 Attention: Sustained Sustained Attention: Impaired Sustained Attention Impairment: Verbal basic Memory: Impaired Memory Impairment: Retrieval deficit Awareness: Appears intact Problem Solving: Impaired Problem Solving Impairment: Verbal basic Cognition Arousal: Alert Behavior During  Therapy: WFL for tasks assessed/performed Overall Cognitive Status: Impaired/Different from baseline Area of Impairment: Attention, Memory, Following commands, Safety/judgement, Awareness, Problem solving Current Attention Level: Selective Memory: Decreased short-term memory Following Commands: Follows one step commands consistently, Follows multi-step commands inconsistently Safety/Judgement: Decreased awareness of safety, Decreased awareness of deficits Awareness: Emergent Problem Solving: Slow processing, Requires verbal cues General Comments: overall WFL for basic orientation impulsive throughout, limited insight to  deficits and problem solving.   Blood pressure (!) 155/70, pulse 65, temperature 98.8 F (37.1 C), temperature source Oral, resp. rate 18, height 5\' 5"  (1.651 m), weight 93.6 kg, SpO2 98%. Physical Exam  Results for orders placed or performed during the hospital encounter of 07/05/23 (from the past 48 hour(s))  Potassium     Status: None   Collection Time: 07/07/23  9:02 AM  Result Value Ref Range   Potassium 3.9 3.5 - 5.1 mmol/L    Comment: Performed at [facility], [address], [place], [place] [address]  Basic metabolic panel     Status: Abnormal   Collection Time: 07/08/23  4:38 AM  Result Value Ref Range   Sodium 138 135 - 145 mmol/L   Potassium 3.9 3.5 - 5.1 mmol/L   Chloride 108 98 - 111 mmol/L   CO2 21 (L) 22 - 32 mmol/L   Glucose, Bld 98 70 - 99 mg/dL    Comment: Glucose reference range applies only to samples taken after fasting for at least 8 hours.   BUN 20 8 - 23 mg/dL   Creatinine, Ser 5.95 0.44 - 1.00 mg/dL   Calcium 8.7 (L) 8.9 - 10.3 mg/dL   GFR, Estimated >63 >87 mL/min    Comment: (NOTE) Calculated using the CKD-EPI Creatinine Equation (2021)    Anion gap 9 5 - 15    Comment: Performed at [facility], [address], [place], [place] [address]   MR BRAIN W WO CONTRAST  Result Date: 07/08/2023 CLINICAL DATA:  Meningitis/CNS infection suspected. Visual hallucinations. Recent fall. Expressive aphasia. EXAM: MRI HEAD WITHOUT AND WITH CONTRAST TECHNIQUE: Multiplanar, multiecho pulse sequences of the brain and surrounding structures were obtained without and with intravenous contrast. CONTRAST:  10mL GADAVIST GADOBUTROL 1 MMOL/ML IV SOLN COMPARISON:  CT earlier same day.  MRI 3 days ago. FINDINGS: Brain: Chronic small-vessel ischemic changes affect pons. No focal cerebellar insult. Cerebral hemispheres show moderate chronic small-vessel ischemic changes of the white matter. Punctate acute infarction affecting the left parietal cortex. As an isolated  finding, this could be incidental. No evidence of mass, hemorrhage, hydrocephalus or extra-axial collection. After contrast administration, no abnormal brain or leptomeningeal enhancement occurs. Vascular: Major vessels at the base of the brain show flow. Skull and upper cervical spine: Negative Sinuses/Orbits: Sinuses are clear. Known orbital blowout fracture of the orbital floor on the right. Other: None IMPRESSION: 1. Punctate (1 mm) acute infarction affecting the left parietal cortex. As an isolated finding, this could be incidental. 2. Chronic small-vessel ischemic changes of the pons and cerebral hemispheric white matter. 3. Known orbital blowout fracture of the orbital floor on the right. 4. No imaging finding to suggest the diagnosis of meningitis. Electronically Signed   By: Paulina Fusi M.D.   On: 07/08/2023 14:26   CT HEAD WO CONTRAST ( )  Result Date: 07/08/2023 CLINICAL DATA:  Follow-up examination for stroke. EXAM: CT HEAD WITHOUT CONTRAST TECHNIQUE: Contiguous axial images were obtained from the base of the skull through the vertex without intravenous contrast. RADIATION DOSE  REDUCTION: This exam was performed according to the departmental dose-optimization program which includes automated exposure control, adjustment of the mA and/or kV according to patient size and/or use of iterative reconstruction technique. COMPARISON:  Prior MRI 07/05/2023. FINDINGS: Brain: Cerebral volume within normal limits. Patchy hypodensity involving the supratentorial cerebral white matter, consistent with chronic microvascular ischemic disease, moderate in nature. No acute intracranial hemorrhage. No acute large vessel territory infarct. No mass lesion or midline shift. No hydrocephalus or extra-axial fluid collection. Vascular: No abnormal hyperdense vessel. Skull: Scalp soft tissues demonstrate no acute finding. Calvarium intact. Sinuses/Orbits: Posttraumatic findings about the partially visualized right face  and orbit again noted. No new finding. Paranasal sinuses are otherwise largely clear. Mastoid air cells and middle ear cavities are well pneumatized and free of fluid. 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 IMPRESSION: 1. No acute intracranial abnormality. 2. Moderate chronic microvascular ischemic disease. 3. Posttraumatic findings about the partially visualized right face and orbit, described on recent maxillofacial CT. Electronically Signed   By: Rise Mu M.D.   On: 07/08/2023 06:27      Blood pressure (!) 155/70, pulse 65, temperature 98.8 F (37.1 C), temperature source Oral, resp. rate 18, height 5\' 5"  (1.651 m), weight 93.6 kg, SpO2 98%.  Medical Problem List and Plan: 1. Functional deficits secondary to ***  -patient may *** shower  -ELOS/Goals: *** 2.  Antithrombotics: -DVT/anticoagulation:  {VTE PROPHYLAXIS/ANTICOAGULATION - ZOXW:960454}  -antiplatelet therapy: *** 3. Pain Management: *** 4. Mood/Behavior/Sleep: ***  -antipsychotic agents: *** 5. Neuropsych/cognition: This patient *** capable of making decisions on *** own behalf. 6. Skin/Wound Care: *** 7. Fluids/Electrolytes/Nutrition: ***     ***  Jacquelynn Cree, PA-C 07/08/2023

## 2023-07-09 ENCOUNTER — Inpatient Hospital Stay (HOSPITAL_COMMUNITY)
Admission: AD | Admit: 2023-07-09 | Discharge: 2023-07-17 | DRG: 101 | Disposition: A | Discharge status: Home / Self Care | Payer: Medicare Other | Source: Intra-hospital | Attending: Physical Medicine & Rehabilitation | Admitting: Physical Medicine & Rehabilitation

## 2023-07-09 ENCOUNTER — Encounter (HOSPITAL_COMMUNITY): Payer: Self-pay | Admitting: Physical Medicine & Rehabilitation

## 2023-07-09 ENCOUNTER — Other Ambulatory Visit: Payer: Self-pay

## 2023-07-09 ENCOUNTER — Inpatient Hospital Stay (HOSPITAL_COMMUNITY): Payer: Medicare Other

## 2023-07-09 DIAGNOSIS — G40909 Epilepsy, unspecified, not intractable, without status epilepticus: Secondary | ICD-10-CM | POA: Diagnosis present

## 2023-07-09 DIAGNOSIS — Z885 Allergy status to narcotic agent status: Secondary | ICD-10-CM | POA: Diagnosis not present

## 2023-07-09 DIAGNOSIS — M1990 Unspecified osteoarthritis, unspecified site: Secondary | ICD-10-CM | POA: Diagnosis present

## 2023-07-09 DIAGNOSIS — R002 Palpitations: Secondary | ICD-10-CM | POA: Diagnosis not present

## 2023-07-09 DIAGNOSIS — R4701 Aphasia: Secondary | ICD-10-CM | POA: Diagnosis present

## 2023-07-09 DIAGNOSIS — H532 Diplopia: Secondary | ICD-10-CM | POA: Diagnosis present

## 2023-07-09 DIAGNOSIS — I10 Essential (primary) hypertension: Secondary | ICD-10-CM | POA: Diagnosis present

## 2023-07-09 DIAGNOSIS — R0602 Shortness of breath: Secondary | ICD-10-CM | POA: Diagnosis not present

## 2023-07-09 DIAGNOSIS — Z8249 Family history of ischemic heart disease and other diseases of the circulatory system: Secondary | ICD-10-CM | POA: Diagnosis not present

## 2023-07-09 DIAGNOSIS — S0285XA Fracture of orbit, unspecified, initial encounter for closed fracture: Secondary | ICD-10-CM | POA: Diagnosis not present

## 2023-07-09 DIAGNOSIS — E669 Obesity, unspecified: Secondary | ICD-10-CM | POA: Diagnosis present

## 2023-07-09 DIAGNOSIS — E785 Hyperlipidemia, unspecified: Secondary | ICD-10-CM | POA: Diagnosis present

## 2023-07-09 DIAGNOSIS — R110 Nausea: Secondary | ICD-10-CM | POA: Diagnosis present

## 2023-07-09 DIAGNOSIS — I639 Cerebral infarction, unspecified: Secondary | ICD-10-CM | POA: Diagnosis not present

## 2023-07-09 DIAGNOSIS — S0083XD Contusion of other part of head, subsequent encounter: Secondary | ICD-10-CM

## 2023-07-09 DIAGNOSIS — R441 Visual hallucinations: Secondary | ICD-10-CM | POA: Diagnosis present

## 2023-07-09 DIAGNOSIS — T426X5A Adverse effect of other antiepileptic and sedative-hypnotic drugs, initial encounter: Secondary | ICD-10-CM | POA: Diagnosis present

## 2023-07-09 DIAGNOSIS — K219 Gastro-esophageal reflux disease without esophagitis: Secondary | ICD-10-CM | POA: Diagnosis present

## 2023-07-09 DIAGNOSIS — Z79899 Other long term (current) drug therapy: Secondary | ICD-10-CM | POA: Diagnosis not present

## 2023-07-09 DIAGNOSIS — Z7902 Long term (current) use of antithrombotics/antiplatelets: Secondary | ICD-10-CM | POA: Diagnosis not present

## 2023-07-09 DIAGNOSIS — Z90710 Acquired absence of both cervix and uterus: Secondary | ICD-10-CM

## 2023-07-09 DIAGNOSIS — W010XXD Fall on same level from slipping, tripping and stumbling without subsequent striking against object, subsequent encounter: Secondary | ICD-10-CM | POA: Diagnosis present

## 2023-07-09 DIAGNOSIS — Z9183 Wandering in diseases classified elsewhere: Secondary | ICD-10-CM | POA: Diagnosis not present

## 2023-07-09 DIAGNOSIS — I69398 Other sequelae of cerebral infarction: Secondary | ICD-10-CM

## 2023-07-09 DIAGNOSIS — I69320 Aphasia following cerebral infarction: Secondary | ICD-10-CM

## 2023-07-09 DIAGNOSIS — S0219XD Other fracture of base of skull, subsequent encounter for fracture with routine healing: Secondary | ICD-10-CM

## 2023-07-09 DIAGNOSIS — T50905A Adverse effect of unspecified drugs, medicaments and biological substances, initial encounter: Secondary | ICD-10-CM | POA: Diagnosis not present

## 2023-07-09 DIAGNOSIS — Z602 Problems related to living alone: Secondary | ICD-10-CM | POA: Diagnosis present

## 2023-07-09 DIAGNOSIS — Z7982 Long term (current) use of aspirin: Secondary | ICD-10-CM | POA: Diagnosis not present

## 2023-07-09 DIAGNOSIS — Z888 Allergy status to other drugs, medicaments and biological substances status: Secondary | ICD-10-CM

## 2023-07-09 DIAGNOSIS — Z800 Family history of malignant neoplasm of digestive organs: Secondary | ICD-10-CM | POA: Diagnosis not present

## 2023-07-09 DIAGNOSIS — Z23 Encounter for immunization: Secondary | ICD-10-CM

## 2023-07-09 DIAGNOSIS — Z8673 Personal history of transient ischemic attack (TIA), and cerebral infarction without residual deficits: Secondary | ICD-10-CM | POA: Diagnosis not present

## 2023-07-09 DIAGNOSIS — I517 Cardiomegaly: Secondary | ICD-10-CM | POA: Diagnosis not present

## 2023-07-09 DIAGNOSIS — Z801 Family history of malignant neoplasm of trachea, bronchus and lung: Secondary | ICD-10-CM | POA: Diagnosis not present

## 2023-07-09 DIAGNOSIS — M48062 Spinal stenosis, lumbar region with neurogenic claudication: Secondary | ICD-10-CM | POA: Diagnosis not present

## 2023-07-09 DIAGNOSIS — S0231XS Fracture of orbital floor, right side, sequela: Secondary | ICD-10-CM | POA: Diagnosis not present

## 2023-07-09 DIAGNOSIS — S0231XD Fracture of orbital floor, right side, subsequent encounter for fracture with routine healing: Secondary | ICD-10-CM | POA: Diagnosis not present

## 2023-07-09 DIAGNOSIS — R569 Unspecified convulsions: Secondary | ICD-10-CM | POA: Diagnosis not present

## 2023-07-09 DIAGNOSIS — I6389 Other cerebral infarction: Secondary | ICD-10-CM

## 2023-07-09 DIAGNOSIS — E66811 Obesity, class 1: Secondary | ICD-10-CM | POA: Diagnosis present

## 2023-07-09 DIAGNOSIS — I493 Ventricular premature depolarization: Secondary | ICD-10-CM | POA: Diagnosis present

## 2023-07-09 DIAGNOSIS — Z6831 Body mass index (BMI) 31.0-31.9, adult: Secondary | ICD-10-CM

## 2023-07-09 DIAGNOSIS — M2041 Other hammer toe(s) (acquired), right foot: Secondary | ICD-10-CM | POA: Diagnosis present

## 2023-07-09 DIAGNOSIS — R269 Unspecified abnormalities of gait and mobility: Secondary | ICD-10-CM | POA: Diagnosis not present

## 2023-07-09 DIAGNOSIS — Z96652 Presence of left artificial knee joint: Secondary | ICD-10-CM | POA: Diagnosis present

## 2023-07-09 DIAGNOSIS — K581 Irritable bowel syndrome with constipation: Secondary | ICD-10-CM | POA: Diagnosis present

## 2023-07-09 DIAGNOSIS — T50995A Adverse effect of other drugs, medicaments and biological substances, initial encounter: Secondary | ICD-10-CM | POA: Diagnosis not present

## 2023-07-09 LAB — COMPREHENSIVE METABOLIC PANEL
ALT: 15 U/L (ref 0–44)
AST: 19 U/L (ref 15–41)
Albumin: 3.6 g/dL (ref 3.5–5.0)
Alkaline Phosphatase: 57 U/L (ref 38–126)
Anion gap: 7 (ref 5–15)
BUN: 20 mg/dL (ref 8–23)
CO2: 23 mmol/L (ref 22–32)
Calcium: 8.9 mg/dL (ref 8.9–10.3)
Chloride: 108 mmol/L (ref 98–111)
Creatinine, Ser: 0.74 mg/dL (ref 0.44–1.00)
GFR, Estimated: >60 mL/min (ref >60)
Glucose, Bld: 99 mg/dL (ref 70–99)
Potassium: 3.9 mmol/L (ref 3.5–5.1)
Sodium: 138 mmol/L (ref 135–145)
Total Bilirubin: 0.9 mg/dL (ref <1.2)
Total Protein: 6.4 g/dL — ABNORMAL LOW (ref 6.5–8.1)

## 2023-07-09 LAB — ECHOCARDIOGRAM COMPLETE
AR max vel: 1.84 cm2
AV Area VTI: 1.72 cm2
AV Area mean vel: 1.79 cm2
AV Mean grad: 4 mm[Hg]
AV Peak grad: 7.4 mm[Hg]
Ao pk vel: 1.36 m/s
Area-P 1/2: 2.07 cm2
Height: 65 in
S' Lateral: 2.6 cm
Weight: 3301.61 [oz_av]

## 2023-07-09 LAB — CBC WITH DIFFERENTIAL/PLATELET
Abs Immature Granulocytes: 0.02 10*3/uL (ref 0.00–0.07)
Basophils Absolute: 0.1 10*3/uL (ref 0.0–0.1)
Basophils Relative: 1 %
Eosinophils Absolute: 0.3 10*3/uL (ref 0.0–0.5)
Eosinophils Relative: 5 %
HCT: 39.8 % (ref 36.0–46.0)
Hemoglobin: 13.2 g/dL (ref 12.0–15.0)
Immature Granulocytes: 0 %
Lymphocytes Relative: 25 %
Lymphs Abs: 1.4 10*3/uL (ref 0.7–4.0)
MCH: 30.1 pg (ref 26.0–34.0)
MCHC: 33.2 g/dL (ref 30.0–36.0)
MCV: 90.9 fL (ref 80.0–100.0)
Monocytes Absolute: 0.5 10*3/uL (ref 0.1–1.0)
Monocytes Relative: 10 %
Neutro Abs: 3.3 10*3/uL (ref 1.7–7.7)
Neutrophils Relative %: 59 %
Platelets: 201 10*3/uL (ref 150–400)
RBC: 4.38 MIL/uL (ref 3.87–5.11)
RDW: 13 % (ref 11.5–15.5)
WBC: 5.5 10*3/uL (ref 4.0–10.5)
nRBC: 0 % (ref 0.0–0.2)

## 2023-07-09 LAB — PHOSPHORUS: Phosphorus: 4 mg/dL (ref 2.5–4.6)

## 2023-07-09 LAB — MAGNESIUM: Magnesium: 2 mg/dL (ref 1.7–2.4)

## 2023-07-09 MED ORDER — ACETAMINOPHEN 325 MG PO TABS
325.0000 mg | ORAL_TABLET | ORAL | Status: DC | PRN
  Administered 2023-07-11: 650 mg via ORAL
  Filled 2023-07-09: qty 2

## 2023-07-09 MED ORDER — INFLUENZA VAC A&B SURF ANT ADJ 0.5 ML IM SUSY
0.5000 mL | PREFILLED_SYRINGE | INTRAMUSCULAR | Status: AC
  Administered 2023-07-10: 0.5 mL via INTRAMUSCULAR
  Filled 2023-07-09: qty 0.5

## 2023-07-09 MED ORDER — MELATONIN 5 MG PO TABS: 5.0000 mg | ORAL_TABLET | Freq: Every evening | ORAL | Status: DC | PRN

## 2023-07-09 MED ORDER — ADULT MULTIVITAMIN W/MINERALS CH
1.0000 | ORAL_TABLET | Freq: Every day | ORAL | Status: DC
  Administered 2023-07-10: 1 via ORAL
  Filled 2023-07-09: qty 1

## 2023-07-09 MED ORDER — ATORVASTATIN CALCIUM 10 MG PO TABS
20.0000 mg | ORAL_TABLET | Freq: Every day | ORAL | Status: DC
  Administered 2023-07-10 – 2023-07-17 (×8): 20 mg via ORAL
  Filled 2023-07-09 (×8): qty 2

## 2023-07-09 MED ORDER — ENOXAPARIN SODIUM 40 MG/0.4ML IJ SOSY
40.0000 mg | PREFILLED_SYRINGE | INTRAMUSCULAR | Status: DC
  Administered 2023-07-09 – 2023-07-13 (×5): 40 mg via SUBCUTANEOUS
  Filled 2023-07-09 (×5): qty 0.4

## 2023-07-09 MED ORDER — FOLIC ACID 1 MG PO TABS
1.0000 mg | ORAL_TABLET | Freq: Every day | ORAL | Status: DC
  Administered 2023-07-10: 1 mg via ORAL
  Filled 2023-07-09: qty 1

## 2023-07-09 MED ORDER — DIVALPROEX SODIUM 500 MG PO DR TAB
500.0000 mg | DELAYED_RELEASE_TABLET | Freq: Three times a day (TID) | ORAL | Status: DC
Start: 2023-07-09 — End: 2023-07-14
  Administered 2023-07-09 – 2023-07-14 (×14): 500 mg via ORAL
  Filled 2023-07-09 (×15): qty 1

## 2023-07-09 MED ORDER — ATORVASTATIN CALCIUM 10 MG PO TABS
20.0000 mg | ORAL_TABLET | Freq: Every day | ORAL | Status: DC
  Administered 2023-07-09: 20 mg via ORAL
  Filled 2023-07-09: qty 2

## 2023-07-09 MED ORDER — CLOPIDOGREL BISULFATE 75 MG PO TABS: 75.0000 mg | ORAL_TABLET | Freq: Every day | ORAL | Status: DC

## 2023-07-09 MED ORDER — FLEET ENEMA RE ENEM: 1.0000 | ENEMA | Freq: Once | RECTAL | Status: DC | PRN

## 2023-07-09 MED ORDER — ASPIRIN 81 MG PO TBEC: 81.0000 mg | DELAYED_RELEASE_TABLET | Freq: Every day | ORAL | Status: DC

## 2023-07-09 MED ORDER — LINACLOTIDE 145 MCG PO CAPS
290.0000 ug | ORAL_CAPSULE | Freq: Every day | ORAL | Status: DC | PRN
  Administered 2023-07-12 – 2023-07-14 (×2): 290 ug via ORAL

## 2023-07-09 MED ORDER — THIAMINE MONONITRATE 100 MG PO TABS
100.0000 mg | ORAL_TABLET | Freq: Every day | ORAL | Status: DC
  Administered 2023-07-10: 100 mg via ORAL
  Filled 2023-07-09: qty 1

## 2023-07-09 MED ORDER — BISACODYL 5 MG PO TBEC: 5.0000 mg | DELAYED_RELEASE_TABLET | Freq: Every day | ORAL | Status: DC | PRN

## 2023-07-09 MED ORDER — GUAIFENESIN-DM 100-10 MG/5ML PO SYRP: 10.0000 mL | ORAL_SOLUTION | Freq: Four times a day (QID) | ORAL | Status: DC | PRN

## 2023-07-09 MED ORDER — ONDANSETRON HCL 4 MG PO TABS
4.0000 mg | ORAL_TABLET | Freq: Four times a day (QID) | ORAL | Status: DC | PRN
Start: 2023-07-09 — End: 2023-07-17
  Administered 2023-07-11 – 2023-07-12 (×3): 4 mg via ORAL
  Filled 2023-07-09 (×3): qty 1

## 2023-07-09 MED ORDER — METHOCARBAMOL 500 MG PO TABS: 500.0000 mg | ORAL_TABLET | Freq: Four times a day (QID) | ORAL | Status: DC | PRN

## 2023-07-09 MED ORDER — POLYETHYLENE GLYCOL 3350 17 G PO PACK: 17.0000 g | PACK | Freq: Every day | ORAL | Status: DC | PRN

## 2023-07-09 MED ORDER — ENOXAPARIN SODIUM 40 MG/0.4ML IJ SOSY: 40.0000 mg | PREFILLED_SYRINGE | INTRAMUSCULAR | Status: DC

## 2023-07-09 MED ORDER — DIVALPROEX SODIUM 500 MG PO DR TAB: 500.0000 mg | DELAYED_RELEASE_TABLET | Freq: Three times a day (TID) | ORAL | Status: DC

## 2023-07-09 MED ORDER — BUTALBITAL-APAP-CAFFEINE 50-325-40 MG PO TABS: 1.0000 | ORAL_TABLET | Freq: Four times a day (QID) | ORAL | Status: DC | PRN

## 2023-07-09 MED ORDER — HYDRALAZINE HCL 10 MG PO TABS
10.0000 mg | ORAL_TABLET | Freq: Three times a day (TID) | ORAL | Status: DC | PRN
  Administered 2023-07-09: 10 mg via ORAL
  Filled 2023-07-09: qty 1

## 2023-07-09 MED ORDER — ONDANSETRON HCL 4 MG/2ML IJ SOLN
4.0000 mg | Freq: Four times a day (QID) | INTRAMUSCULAR | Status: DC | PRN
Start: 2023-07-09 — End: 2023-07-17

## 2023-07-09 MED ORDER — ASPIRIN 81 MG PO TBEC
81.0000 mg | DELAYED_RELEASE_TABLET | Freq: Every day | ORAL | Status: DC
  Administered 2023-07-10 – 2023-07-17 (×8): 81 mg via ORAL
  Filled 2023-07-09 (×8): qty 1

## 2023-07-09 MED ORDER — CLOPIDOGREL BISULFATE 75 MG PO TABS
75.0000 mg | ORAL_TABLET | Freq: Every day | ORAL | Status: DC
Start: 2023-07-10 — End: 2023-07-17
  Administered 2023-07-10 – 2023-07-17 (×8): 75 mg via ORAL
  Filled 2023-07-09 (×8): qty 1

## 2023-07-09 MED ORDER — ATORVASTATIN CALCIUM 20 MG PO TABS: 20.0000 mg | ORAL_TABLET | Freq: Every day | ORAL | Status: DC

## 2023-07-09 MED ORDER — ALUM & MAG HYDROXIDE-SIMETH 200-200-20 MG/5ML PO SUSP: 30.0000 mL | ORAL | Status: DC | PRN

## 2023-07-09 MED ORDER — ROSUVASTATIN CALCIUM 20 MG PO TABS
20.0000 mg | ORAL_TABLET | Freq: Every day | ORAL | Status: DC
  Filled 2023-07-09: qty 1

## 2023-07-09 MED ORDER — THIAMINE HCL 100 MG/ML IJ SOLN
100.0000 mg | Freq: Every day | INTRAMUSCULAR | Status: DC
  Filled 2023-07-09: qty 1

## 2023-07-09 NOTE — Progress Notes (Signed)
Inpatient Rehab Admissions Coordinator:   I have a bed available for this patient to admit to CIR today.  TOC aware and MD in agreement. I will let pt/family know.   Estill Dooms, PT, DPT Admissions Coordinator [phone] 07/09/23  12:09 PM

## 2023-07-09 NOTE — Discharge Summary (Signed)
Physician Discharge Summary  Chloe Holmes VQQ:[id number] DOB: Jan 06, 1946 DOA: 07/05/2023  PCP: Creola Corn, MD  Admit date: 07/05/2023 Discharge date: 07/09/2023  Time spent: 40 minutes  Recommendations for Outpatient Follow-up:  Follow outpatient CBC/CMP  Please touch base with ENT early next week 11/17 or 18 with regards to her facial fractures and need for further inpatient follow up I discussed with ophthalmology on 11/14 and it sounds like they have no plans for any inpatient management, she'll need to follow with ophtho on discharge (would call back for concern for worsening symptoms) She needs neurology follow up for seizures and her stroke   Discharge Diagnoses:  Principal Problem:   Aphasia Active Problems:   Multiple facial fractures (HCC)   Fall at home, initial encounter   Leukocytosis   History of palpitations   Hyponatremia   Diastolic dysfunction   Hyperlipidemia   Obesity (BMI 30-39.9)   Closed fracture of right orbital floor [facility])   Diplopia   Hypokalemia   Discharge Condition: stable  Diet recommendation: heart healthy  Filed Weights   07/05/23 0400 07/05/23 1600  Weight: 95 kg 93.6 kg    History of present illness:   Chloe Holmes is Chloe Holmes 77 y.o. female with Chloe Holmes history of hypertension, hyperlipidemia, PVCs, anxiety, arthritis, hiatal hernia, GERD, obesity. Patient presented secondary to Chloe Holmes fall at home with associated aphasia concerning for stroke. Code stroke was activated with CT and MRI negative for acute stroke, but CT significant for left PCA severe stenosis. EEG ordered for evaluation of possible seizures -> it revealed left temporal region cortical dysfunction suggesting underlying structural abnormality/post ictal state.  She was initially started on keppra, but due to visual disturbances, has been transitioned to depakote. Patient was also found to have trauma with resultant facial fractures, involving the right orbit. ENT and  ophthalmology consulted with initial recommendations for no inpatient management.  Hospitalization was complicated by MRI findings notable for an acute stroke.  She's been started on DAPT.  Plan is for discharge to inpatient rehab today.  Hospital Course:  Assessment and Plan:  Aphasia Concern for New Onset Seizures Unclear etiology. Initial concern for possible stroke vs TBI/concussion vs epileptic aphasia. MRI brain negative for acute stroke. EEG ordered overnight without seizures or epileptiform discharges, but was significant for left temporal region cortical dysfunction suggesting likely underlying structural abnormality/post-ictal state -Neurology recommendations: EEG with findings suggestive of cortical dysfunction arising from L hemisphere, maximal L temporal region likely secondary to underlying structural abnormality, post ictal state.   -repeat MRI as below, she's been transitioned to depakote with concern for visual disturbances as noted below   Acute Stroke MRI with punctate infarction affecting the L parietal cortex CTA head/neck without LVO, tortuosity in the head and neck with mild for age atherosclerosis  Echo with EF 55-60%, no RWMA A1c 5.2, LDL 94 Appreciate neurology recommendations: DAPTx3 weeks, then aspirin alone, Lipitor increased   Visual Disturbances  Diplopia  Plan for reeval by ophtho after ~1 week (discussed with ophtho today who recommended outpatient follow up since she'll be going to CIR, would call ophtho back as needed) Will need to call ENT around 11/17 to discuss plans for facial fractures as noted below Keppra transitioned to depakote with concern that this maybe causing hallucinations   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  Follow outpatient, nothing to do at this point    Facial fractures Secondary to trauma from fall. Acute fractures  involve the right orbital floor and lamina papyracea; comminuted and  displaced right floor fracture with herniated intraorbital fat. Patient also noted to have hemorrhage in the right maxillary sinus. Patient seen by ENT and ophthalmology with recommendations for no inpatient management at this time.  patient reported diplopia 11/12 (as well as 11/13). Dr. Mal Misty discussed with ENT, Dr. Elijah Birk (11/12) who recommended re-consulting ophthalmology. Dr. Mal Misty discussed with ophthalmology (11/12) who recommended no acute intervention from their standpoint and to have patient follow-up in about 1 week with her ophthalmologist to allow swelling to improve.  I spoke to Dr. Wynelle Link today who noted if she's in CIR in 1 week, can follow outpatient unless something new.  I called ENT on call today and left Chloe Holmes message (awaiting call back).  Will need to call ENT around 11/17 (probably 11/18, Monday) to follow up plan for her facial fractures.  Consider calling ophtho back around that time as well.    Leukocytosis resolved   Hyponatremia resolved   Hypokalemia Resolved with potassium supplementation.   Diastolic heart dysfunction Noted on Transthoracic Echocardiogram to have grade 1 diastolic heart dysfunction without clinical evidence of heart failure.   Hyperlipidemia -Continue atorvastatin   Obesity Body mass index is 34.34 kg/m.      Procedures: Echo IMPRESSIONS     1. Left ventricular ejection fraction, by estimation, is 55 to 60%. The  left ventricle has normal function. The left ventricle has no regional  wall motion abnormalities. There is mild left ventricular hypertrophy.  Left ventricular diastolic parameters  are consistent with Grade I diastolic dysfunction (impaired relaxation).   2. Right ventricular systolic function is normal. The right ventricular  size is normal. There is normal pulmonary artery systolic pressure.   3. Left atrial size was mildly dilated.   4. The mitral valve is normal in structure. Trivial mitral valve  regurgitation. No  evidence of mitral stenosis.   5. The aortic valve is tricuspid. Aortic valve regurgitation is not  visualized. No aortic stenosis is present.   6. The inferior vena cava is normal in size with greater than 50%  respiratory variability, suggesting right atrial pressure of 3 mmHg.    EEG IMPRESSION: This study is suggestive of cortical dysfunction arising from left temporal region. No seizures or epileptiform discharges were seen throughout the recording.  EEG IMPRESSION: This study is suggestive of cortical dysfunction arising from left hemisphere, maximal left temporal region likely secondary to underlying structural abnormality, post-ictal state. No seizures or epileptiform discharges were seen throughout the recording.  Consultations: Neurology ENT ophthalmology  Discharge Exam: Vitals:   07/09/23 0726 07/09/23 1113  BP: (!) 180/76 (!) 164/92  Pulse: 63 62  Resp: 18 (!) 8  Temp: 97.8 F (36.6 C) 98.1 F (36.7 C)  SpO2: 98% 100%   Trying to get follow up with Dr. Ernestene Kiel No other complaints  General: No acute distress. Cardiovascular: RRR Lungs: unlabored Neurological: Alert and oriented 3. Moves all extremities 4. Cranial nerves II through XII grossly intact. Skin: periorbital bruising on R. Extremities: No clubbing or cyanosis. No edema.  Discharge Instructions   Discharge Instructions     Call MD for:  difficulty breathing, headache or visual disturbances   Complete by: As directed    Call MD for:  extreme fatigue   Complete by: As directed    Call MD for:  hives   Complete by: As directed    Call MD for:  persistant dizziness or light-headedness   Complete  by: As directed    Call MD for:  persistant nausea and vomiting   Complete by: As directed    Call MD for:  redness, tenderness, or signs of infection (pain, swelling, redness, odor or green/yellow discharge around incision site)   Complete by: As directed    Call MD for:  severe uncontrolled pain    Complete by: As directed    Call MD for:  temperature >100.4   Complete by: As directed    Diet - low sodium heart healthy   Complete by: As directed    Discharge instructions   Complete by: As directed    You were seen for confusion and speech difficulties.  The suspicion is that your symptoms were related to seizures.    We've started you on depakote.    Per [facility] statutes, patients with seizures are not allowed to drive until  they have been seizure-free for six months. Use caution when using heavy equipment or power tools. Avoid working on ladders or at heights. Take showers instead of baths. Ensure the water temperature is not too high on the home water heater. Do not go swimming alone. When caring for infants or small children, sit down when holding, feeding, or changing them to minimize risk of injury to the child in the event you have Niang Mitcheltree seizure. Also, Maintain good sleep hygiene. Avoid alcohol.  Due to your fall you had an orbital floor fracture.  You've been seen by ophthalmology and ENT who are recommending follow up in about 7 days.  I've communicated to the inpatient rehab folks that they may need to communicate with ENT and ophtho while you're in rehab.   You had Sallie Staron repeat MRI with evidence of Dequarius Jeffries stroke.  You've been started on aspirin and plavix.  You'll continue these together for 21 days, then transition to aspirin alone.  We've increased your lipitor dose.    Return for new, recurrent, or worsening symptoms.  Please ask your PCP to request records from this hospitalization so they know what was done and what the next steps will be.   Increase activity slowly   Complete by: As directed       Allergies as of 07/09/2023       Reactions   Morphine Other (See Comments)   Hypotension; had to be narcan'd . This was 40 years ago   Pantoprazole Cough        Medication List     STOP taking these medications    celecoxib 200 MG capsule Commonly known as:  CELEBREX   diclofenac 50 MG tablet Commonly known as: CATAFLAM       TAKE these medications    aspirin EC 81 MG tablet Take 1 tablet (81 mg total) by mouth daily. Swallow whole. Start taking on: July 10, 2023   atorvastatin 20 MG tablet Commonly known as: LIPITOR Take 1 tablet (20 mg total) by mouth daily. Start taking on: July 10, 2023 What changed:  medication strength how much to take when to take this additional instructions   clopidogrel 75 MG tablet Commonly known as: PLAVIX Take 1 tablet (75 mg total) by mouth daily for 21 days. Start taking on: July 10, 2023   divalproex 500 MG DR tablet Commonly known as: DEPAKOTE Take 1 tablet (500 mg total) by mouth every 8 (eight) hours.   hydrochlorothiazide 25 MG tablet Commonly known as: HYDRODIURIL Take 25 mg by mouth daily.   Linzess 290 MCG Caps capsule Generic  drug: linaclotide Take 1 capsule (290 mcg total) by mouth daily before breakfast. What changed: See the new instructions.   nebivolol 10 MG tablet Commonly known as: BYSTOLIC Take 10 mg by mouth daily.   omeprazole 20 MG tablet Commonly known as: PRILOSEC OTC Take 20 mg by mouth daily.       Allergies  Allergen Reactions   Morphine Other (See Comments)    Hypotension; had to be narcan'd . This was 40 years ago   Pantoprazole Cough      The results of significant diagnostics from this hospitalization (including imaging, microbiology, ancillary and laboratory) are listed below for reference.    Significant Diagnostic Studies: EEG adult  Result Date: Jul 20, 2023 Charlsie Quest, MD     2023/07/20  1:01 PM Patient Name: Brenlie Czapla MRN: [id number] Epilepsy Attending: Charlsie Quest Referring Physician/Provider: Lynnae January, NP Date: Jul 20, 2023 Duration: 24.28 mins Patient history:  77 y.o. female presenting with fall and expressive greater than receptive aphasia. EEG to evaluate for seizure Level of alertness: Awake AEDs  during EEG study: VPA Technical aspects: This EEG study was done with scalp electrodes positioned according to the 10-20 International system of electrode placement. Electrical activity was reviewed with band pass filter of 1-70Hz , sensitivity of 7 uV/mm, display speed of 68mm/sec with Laquilla Dault 60Hz  notched filter applied as appropriate. EEG data were recorded continuously and digitally stored.  Video monitoring was available and reviewed as appropriate. Description: The posterior dominant rhythm consists of 8-9 Hz activity of moderate voltage (25-35 uV) seen predominantly in posterior head regions, symmetric and reactive to eye opening and eye closing. EEG showed intermittent 3 to 6 Hz theta-delta slowing in left temporal region. Hyperventilation and photic stimulation were not performed.    ABNORMALITY - Intermittent slow, left temporal region  IMPRESSION: This study is suggestive of cortical dysfunction arising from left temporal region. No seizures or epileptiform discharges were seen throughout the recording.  Charlsie Quest   ECHOCARDIOGRAM COMPLETE  Result Date: Jul 20, 2023    ECHOCARDIOGRAM REPORT   Patient Name:   [facility] Date of Exam: July 20, 2023 Medical Rec #:  [id number]           Height:       65.0 in Accession #:    [id number]          Weight:       206.3 lb Date of Birth:  09-Jun-1946           BSA:          2.005 m Patient Age:    77 years            BP:           180/76 mmHg Patient Gender: F                   HR:           67 bpm. Exam Location:  Inpatient Procedure: 2D Echo, Cardiac Doppler and Color Doppler Indications:    Stroke  History:        Patient has prior history of Echocardiogram examinations, most                 recent 05/15/2022. Risk Factors:Hypertension.  Sonographer:    Darlys Gales Referring Phys: [id number] Lynnae January IMPRESSIONS  1. Left ventricular ejection fraction, by estimation, is 55 to 60%. The left ventricle has normal function. The left ventricle has no regional  wall motion abnormalities.  There is mild left ventricular hypertrophy. Left ventricular diastolic parameters are consistent with Grade I diastolic dysfunction (impaired relaxation).  2. Right ventricular systolic function is normal. The right ventricular size is normal. There is normal pulmonary artery systolic pressure.  3. Left atrial size was mildly dilated.  4. The mitral valve is normal in structure. Trivial mitral valve regurgitation. No evidence of mitral stenosis.  5. The aortic valve is tricuspid. Aortic valve regurgitation is not visualized. No aortic stenosis is present.  6. The inferior vena cava is normal in size with greater than 50% respiratory variability, suggesting right atrial pressure of 3 mmHg. FINDINGS  Left Ventricle: Left ventricular ejection fraction, by estimation, is 55 to 60%. The left ventricle has normal function. The left ventricle has no regional wall motion abnormalities. The left ventricular internal cavity size was normal in size. There is  mild left ventricular hypertrophy. Left ventricular diastolic parameters are consistent with Grade I diastolic dysfunction (impaired relaxation). Normal left ventricular filling pressure. Right Ventricle: The right ventricular size is normal. No increase in right ventricular wall thickness. Right ventricular systolic function is normal. There is normal pulmonary artery systolic pressure. The tricuspid regurgitant velocity is 2.47 m/s, and  with an assumed right atrial pressure of 3 mmHg, the estimated right ventricular systolic pressure is 27.4 mmHg. Left Atrium: Left atrial size was mildly dilated. Right Atrium: Right atrial size was normal in size. Pericardium: There is no evidence of pericardial effusion. Mitral Valve: The mitral valve is normal in structure. Trivial mitral valve regurgitation. No evidence of mitral valve stenosis. Tricuspid Valve: The tricuspid valve is normal in structure. Tricuspid valve regurgitation is trivial. No  evidence of tricuspid stenosis. Aortic Valve: The aortic valve is tricuspid. Aortic valve regurgitation is not visualized. No aortic stenosis is present. Aortic valve mean gradient measures 4.0 mmHg. Aortic valve peak gradient measures 7.4 mmHg. Aortic valve area, by VTI measures 1.72 cm. Pulmonic Valve: The pulmonic valve was normal in structure. Pulmonic valve regurgitation is not visualized. No evidence of pulmonic stenosis. Aorta: The aortic root is normal in size and structure. Venous: The inferior vena cava is normal in size with greater than 50% respiratory variability, suggesting right atrial pressure of 3 mmHg. IAS/Shunts: No atrial level shunt detected by color flow Doppler.  LEFT VENTRICLE PLAX 2D LVIDd:         3.60 cm   Diastology LVIDs:         2.60 cm   LV e' medial:    6.85 cm/s LV PW:         1.10 cm   LV E/e' medial:  9.6 LV IVS:        1.30 cm   LV e' lateral:   7.51 cm/s LVOT diam:     1.80 cm   LV E/e' lateral: 8.8 LV SV:         58 LV SV Index:   29 LVOT Area:     2.54 cm  RIGHT VENTRICLE RV S prime:     13.10 cm/s TAPSE (M-mode): 2.4 cm LEFT ATRIUM             Index        RIGHT ATRIUM           Index LA Vol (A2C):   62.8 ml 31.33 ml/m  RA Area:     19.70 cm LA Vol (A4C):   56.6 ml 28.24 ml/m  RA Volume:   53.70 ml  26.79 ml/m LA Biplane  Vol: 62.1 ml 30.98 ml/m  AORTIC VALVE AV Area (Vmax):    1.84 cm AV Area (Vmean):   1.79 cm AV Area (VTI):     1.72 cm AV Vmax:           136.00 cm/s AV Vmean:          103.000 cm/s AV VTI:            0.338 m AV Peak Grad:      7.4 mmHg AV Mean Grad:      4.0 mmHg LVOT Vmax:         98.20 cm/s LVOT Vmean:        72.500 cm/s LVOT VTI:          0.228 m LVOT/AV VTI ratio: 0.67  AORTA Ao Root diam: 2.70 cm Ao Asc diam:  3.60 cm MITRAL VALVE               TRICUSPID VALVE MV Area (PHT): 2.07 cm    TR Peak grad:   24.4 mmHg MV Decel Time: 366 msec    TR Vmax:        247.00 cm/s MV E velocity: 66.00 cm/s MV Pammie Chirino velocity: 99.60 cm/s  SHUNTS MV E/Jazarah Capili ratio:  0.66         Systemic VTI:  0.23 m                            Systemic Diam: 1.80 cm Chilton Si MD Electronically signed by Chilton Si MD Signature Date/Time: 07/09/2023/11:20:53 AM    Final    MR BRAIN W WO CONTRAST  Result Date: 07/08/2023 CLINICAL DATA:  Meningitis/CNS infection suspected. Visual hallucinations. Recent fall. Expressive aphasia. EXAM: MRI HEAD WITHOUT AND WITH CONTRAST TECHNIQUE: Multiplanar, multiecho pulse sequences of the brain and surrounding structures were obtained without and with intravenous contrast. CONTRAST:  10mL GADAVIST GADOBUTROL 1 MMOL/ML IV SOLN COMPARISON:  CT earlier same day.  MRI 3 days ago. FINDINGS: Brain: Chronic small-vessel ischemic changes affect pons. No focal cerebellar insult. Cerebral hemispheres show moderate chronic small-vessel ischemic changes of the white matter. Punctate acute infarction affecting the left parietal cortex. As an isolated finding, this could be incidental. No evidence of mass, hemorrhage, hydrocephalus or extra-axial collection. After contrast administration, no abnormal brain or leptomeningeal enhancement occurs. Vascular: Major vessels at the base of the brain show flow. Skull and upper cervical spine: Negative Sinuses/Orbits: Sinuses are clear. Known orbital blowout fracture of the orbital floor on the right. Other: None IMPRESSION: 1. Punctate (1 mm) acute infarction affecting the left parietal cortex. As an isolated finding, this could be incidental. 2. Chronic small-vessel ischemic changes of the pons and cerebral hemispheric white matter. 3. Known orbital blowout fracture of the orbital floor on the right. 4. No imaging finding to suggest the diagnosis of meningitis. Electronically Signed   By: Paulina Fusi M.D.   On: 07/08/2023 14:26   CT HEAD WO CONTRAST ( )  Result Date: 07/08/2023 CLINICAL DATA:  Follow-up examination for stroke. EXAM: CT HEAD WITHOUT CONTRAST TECHNIQUE: Contiguous axial images were obtained from the  base of the skull through the vertex without intravenous contrast. RADIATION DOSE REDUCTION: This exam was performed according to the departmental dose-optimization program which includes automated exposure control, adjustment of the mA and/or kV according to patient size and/or use of iterative reconstruction technique. COMPARISON:  Prior MRI 07/05/2023. FINDINGS: Brain: Cerebral volume within normal limits. Patchy hypodensity involving the  supratentorial cerebral white matter, consistent with chronic microvascular ischemic disease, moderate in nature. No acute intracranial hemorrhage. No acute large vessel territory infarct. No mass lesion or midline shift. No hydrocephalus or extra-axial fluid collection. Vascular: No abnormal hyperdense vessel. Skull: Scalp soft tissues demonstrate no acute finding. Calvarium intact. Sinuses/Orbits: Posttraumatic findings about the partially visualized right face and orbit again noted. No new finding. Paranasal sinuses are otherwise largely clear. Mastoid air cells and middle ear cavities are well pneumatized and free of fluid. 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 IMPRESSION: 1. No acute intracranial abnormality. 2. Moderate chronic microvascular ischemic disease. 3. Posttraumatic findings about the partially visualized right face and orbit, described on recent maxillofacial CT. Electronically Signed   By: Rise Mu M.D.   On: 07/08/2023 06:27   Overnight EEG with video  Result Date: 07/06/2023 Charlsie Quest, MD     07/07/2023  9:27 AM Patient Name: Deidrea Niebel MRN: [id number] Epilepsy Attending: Charlsie Quest Referring Physician/Provider: Erick Blinks, MD Duration: 07/05/2023 1104 to 07/06/2023 1104 Patient history: 77 y.o. female presenting with fall and expressive greater than receptive aphasia. EEG to evaluate for seizure Level of alertness: Awake, asleep AEDs during EEG study: None Technical aspects: This EEG  study was done with scalp electrodes positioned according to the 10-20 International system of electrode placement. Electrical activity was reviewed with band pass filter of 1-70Hz , sensitivity of 7 uV/mm, display speed of 33mm/sec with Daisha Filosa 60Hz  notched filter applied as appropriate. EEG data were recorded continuously and digitally stored.  Video monitoring was available and reviewed as appropriate. Description: The posterior dominant rhythm consists of 8-9 Hz activity of moderate voltage (25-35 uV) seen predominantly in posterior head regions, symmetric and reactive to eye opening and eye closing. Sleep was characterized by vertex waves, sleep spindles (12 to 14 Hz), maximal frontocentral region. EEG showed continuous 3 to 6 Hz theta-delta slowing in left hemisphere, maximal left temporal region. Hyperventilation and photic stimulation were not performed.   ABNORMALITY - Continuous slow, left hemisphere, maximal left temporal region IMPRESSION: This study is suggestive of cortical dysfunction arising from left hemisphere, maximal left temporal region likely secondary to underlying structural abnormality, post-ictal state. No seizures or epileptiform discharges were seen throughout the recording. Charlsie Quest   MR BRAIN WO CONTRAST  Result Date: 07/05/2023 CLINICAL DATA:  77 year old female code stroke presentation this morning, trauma with orbital floor fracture. Left PCA stenosis on CTA. EXAM: MRI HEAD WITHOUT CONTRAST TECHNIQUE: Multiplanar, multiecho pulse sequences of the brain and surrounding structures were obtained without intravenous contrast. COMPARISON:  Head CT, face CT, cervical spine CT, CTA and CTP this morning. FINDINGS: Brain: Somewhat noisy DWI.  No convincing diffusion restriction. Patchy and scattered bilateral cerebral white matter T2 and FLAIR hyperintensity in both hemispheres. Left lentiform perivascular space (normal variant). Deep gray matter nuclei relatively spared. Minimal  involvement of the pons. Cerebellum appears spared. No cortical encephalomalacia or chronic cerebral blood products identified. No midline shift, mass effect, evidence of mass lesion, ventriculomegaly, extra-axial collection or acute intracranial hemorrhage. Cervicomedullary junction and pituitary are within normal limits. Vascular: Major intracranial vascular flow voids are preserved. Skull and upper cervical spine: Facial fractures detailed by CT. Normal background bone marrow signal. Negative visible cervical spine for age. Sinuses/Orbits: Right orbit fracture and associated soft tissue injury as per CT. Layering hemorrhage in the right maxillary sinus. Other: Right face hematoma/contusion/edema. Mastoids remain clear. Grossly normal visible internal auditory structures. IMPRESSION: 1. No acute  infarct or acute intracranial abnormality identified. Moderate for age cerebral white matter signal changes. 2. Acute traumatic findings about the right face and orbit as per CT today. Electronically Signed   By: Odessa Fleming M.D.   On: 07/05/2023 10:59   CT CERVICAL SPINE WO CONTRAST  Result Date: 07/05/2023 CLINICAL DATA:  77 year old female. Code stroke. Fall. Abnormal head CT. EXAM: CT CERVICAL SPINE WITHOUT CONTRAST TECHNIQUE: Multidetector CT imaging of the cervical spine was performed without intravenous contrast. Multiplanar CT image reconstructions were also generated. RADIATION DOSE REDUCTION: This exam was performed according to the departmental dose-optimization program which includes automated exposure control, adjustment of the mA and/or kV according to patient size and/or use of iterative reconstruction technique. COMPARISON:  CT head and face today. CTA neck today reported separately. FINDINGS: Alignment: Straightening of lower cervical and mild reversal of upper cervical lordosis. Mild degenerative appearing anterolisthesis of C7 on T1. Bilateral posterior element alignment is within normal limits. Skull  base and vertebrae: Visualized skull base is intact. No atlanto-occipital dissociation. C1 and C2 appear intact and aligned. No acute osseous abnormality identified. Soft tissues and spinal canal: No prevertebral fluid or swelling. No visible canal hematoma. Face reported separately. Negative noncontrast visible deep soft tissue spaces of the neck. Disc levels: Moderate to severe chronic cervical disc and endplate degeneration C3-C4 through C5-C6 superimposed on degenerative appearing C6-C7 ankylosis. Widespread superimposed facet arthropathy bilaterally, although generally worse on the right. There is evidence of developing facet ankylosis on the right at C3-C4. There is mild to moderate spinal stenosis at C4-C5, and moderate to severe spinal stenosis at C5-C6 (series 4, image 62). Upper chest: Minimally included, see Neck CTA reported separately. IMPRESSION: 1. No acute traumatic injury identified in the cervical spine. 2. 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 Multilevel spinal stenosis, up to severe at C5-C6, with evidence of spinal cord mass effect. Electronically Signed   By: Odessa Fleming M.D.   On: 07/05/2023 05:11   CT Maxillofacial Wo Contrast  Result Date: 07/05/2023 CLINICAL DATA:  77 year old female. Code stroke. Fall. Abnormal head CT. EXAM: CT MAXILLOFACIAL WITHOUT CONTRAST TECHNIQUE: Multidetector CT imaging of the maxillofacial structures was performed. Multiplanar CT image reconstructions were also generated. RADIATION DOSE REDUCTION: This exam was performed according to the departmental dose-optimization program which includes automated exposure control, adjustment of the mA and/or kV according to patient size and/or use of iterative reconstruction technique. COMPARISON:  Head CT today reported separately. Prior face CT 10/10/2007. FINDINGS: Osseous: Severe left TMJ degeneration. Mandible intact and normally located. Right  maxillary dentition periapical lucency but no complicating features. Left maxillary sinus walls are intact. Right maxillary sinus walls are intact but there is layering hemorrhage within the sinus. See orbit findings below. Bilateral zygoma and pterygoid bones appear intact. No acute nasal bone fracture. Central skull base intact. Cervical spine detailed separately. Orbits: Chronic left orbital floor fracture, improved compared to 2009. Acute right orbital floor fracture with comminution, herniated intraorbital fat (series 6, image 50) and marginal herniation of the inferior rectus muscle on image 51. There is mild contusion, fat stranding in the inferior right orbit. No intraorbital hematoma. Mild right lamina papyracea fracture also. Other orbital walls remain intact. Globes and other intraorbital soft tissues are within normal limits. There is additional right orbit preseptal, periorbital soft tissue hematoma/contusion which tracks to the forehead. Sinuses: Focal hemorrhage in Antron Seth right ethmoid air cell and layering hemorrhage in the right maxillary sinus  related to the orbit fractures. Other paranasal sinuses are well aerated. Tympanic cavities and mastoids are clear. Soft tissues: Negative visible noncontrast thyroid, larynx, pharynx, parapharyngeal spaces, retropharyngeal space, sublingual space, submandibular spaces, masticator and parotid spaces are within normal limits. Right anterior and lateral face roughly 5 cm area of superficial hematoma/contusion. This overlies the right zygomaticomaxillary confluence which appears to remain intact. No soft tissue gas. Limited intracranial: Stable to that reported separately. IMPRESSION: 1. Acute Right Orbital floor and lamina papyracea fractures. Comminuted and displaced right floor fracture with herniated intraorbital fat, marginally herniated inferior rectus muscle. Minimal intraorbital contusion associated. Hemorrhage in the right maxillary sinus. 2. No other acute  facial fracture identified. Additional right face and periorbital superficial hematoma/contusion. 3. 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 Electronically Signed   By: Odessa Fleming M.D.   On: 07/05/2023 05:04   CT ANGIO HEAD NECK W WO CM (CODE STROKE)  Result Date: 07/05/2023 CLINICAL DATA:  77 year old female with aphasia.  Trauma. EXAM: CT ANGIOGRAPHY HEAD AND NECK CT PERFUSION BRAIN TECHNIQUE: Multidetector CT imaging of the head and neck was performed using the standard protocol during bolus administration of intravenous contrast. Multiplanar CT image reconstructions and MIPs were obtained to evaluate the vascular anatomy. Carotid stenosis measurements (when applicable) are obtained utilizing NASCET criteria, using the distal internal carotid diameter as the denominator. Multiphase CT imaging of the brain was performed following IV bolus contrast injection. Subsequent parametric perfusion maps were calculated using RAPID software. RADIATION DOSE REDUCTION: This exam was performed according to the departmental dose-optimization program which includes automated exposure control, adjustment of the mA and/or kV according to patient size and/or use of iterative reconstruction technique. CONTRAST:  40mL OMNIPAQUE IOHEXOL 350 MG/ML SOLN; 75mL OMNIPAQUE IOHEXOL 350 MG/ML SOLN COMPARISON:  Head CT 0416 hours today. FINDINGS: CT Brain Perfusion Findings: ASPECTS: 10 CBF (<30%) Volume: 0mL.  All CBF in CBV parameters are negative. Perfusion (Tmax>6.0s) volume: 11mL.  Hypoperfusion index of 0 Mismatch Volume: Possibly 11mL Infarction Location:No core infarct detected, abnormal Tmax in the left PCA territory. See below. CTA NECK Skeleton: Face and cervical spine are detailed separately. Grossly intact visible upper thorax. Upper chest: Atelectasis. Mild retained gas and secretions in the thoracic esophagus which is not abnormally dilated. Visible central pulmonary arteries appear patent. Other  neck: Superficial right face soft tissue injury reported separately. Aortic arch: Mild to moderate Calcified aortic atherosclerosis. Bovine arch configuration. Right carotid system: Tortuous brachiocephalic artery and right CCA origin with no significant plaque or stenosis. Significant right subclavian venous contrast streak artifact. Visible right CCA is normal. Minimal calcified plaque at the right carotid bifurcation. Retropharyngeal bifurcation and course of the right ICA with no significant plaque or stenosis to the skull base. Tortuous vessel below the skull base. Left carotid system: Bovine left CCA origin is tortuous with no significant plaque or stenosis. No significant plaque or stenosis at the bifurcation. Tortuous left ICA to the skull base without stenosis. Vertebral arteries: Tortuous proximal right subclavian artery and normal right vertebral artery origin with no plaque or stenosis. Right vertebral appears somewhat dominant, patent and within normal limits to the skull base. Proximal left subclavian mild plaque and tortuosity without stenosis. Left vertebral artery origin is normal. Tortuous left V1 segment. Mildly non dominant left vertebral artery appears patent to the skull base with no significant plaque or stenosis. CTA HEAD Posterior circulation: Patent distal vertebral arteries, PICA origins, vertebrobasilar junction without plaque or stenosis. Mildly dominant right V4. Patent  basilar artery without stenosis. Patent SCA and PCA origins. Both posterior communicating arteries are present. The right PCA branches are within normal limits. There is Cleora Karnik severe short segment left PCA P1 stenosis best demonstrated on series 7, images 145 and 146. This is just proximal to the left posterior communicating artery junction. But beyond that the left PCA branches are within normal limits. Anterior circulation: Both ICA siphons are patent. No significant siphon plaque or stenosis. Mild calcified plaque is  greater on the right. Both posterior communicating and ophthalmic artery origins appear normal. Patent carotid termini. Normal MCA and ACA origins. Anterior communicating artery is mildly ectatic on series 7, image 156 but there is no discrete aneurysm at this time. Bilateral ACA branches are patent with mild irregularity, no significant stenosis. Right MCA M1 segment, right MCA trifurcation and right MCA branches are within normal limits. Left MCA M1 segment and trifurcation are patent without stenosis, mildly ectatic. Left MCA branches are within normal limits. No branch occlusion or stenosis identified. Venous sinuses: Early contrast timing, not evaluated. Anatomic variants: Mildly dominant right vertebral artery. Review of the MIP images confirms the above findings IMPRESSION: 1. 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 But no core infarct., abnormal CBF or CBV on CT Perfusion. This was discussed by telephone with Dr. Erick Blinks on 07/05/2023 at 0438 hours. 2. CTA is negative for large vessel occlusion. Generalized vessel tortuosity in the head and neck with generally mild for age atherosclerosis. No other hemodynamically significant arterial stenosis. The anterior communicating artery is ectatic but without overt aneurysm. 3.  Aortic Atherosclerosis (ICD10-I70.0). Electronically Signed   By: Odessa Fleming M.D.   On: 07/05/2023 04:51   CT CEREBRAL PERFUSION W CONTRAST  Result Date: 07/05/2023 CLINICAL DATA:  77 year old female with aphasia.  Trauma. EXAM: CT ANGIOGRAPHY HEAD AND NECK CT PERFUSION BRAIN TECHNIQUE: Multidetector CT imaging of the head and neck was performed using the standard protocol during bolus administration of intravenous contrast. Multiplanar CT image reconstructions and MIPs were obtained to evaluate the vascular anatomy. Carotid stenosis measurements (when applicable) are obtained utilizing NASCET criteria,  using the distal internal carotid diameter as the denominator. Multiphase CT imaging of the brain was performed following IV bolus contrast injection. Subsequent parametric perfusion maps were calculated using RAPID software. RADIATION DOSE REDUCTION: This exam was performed according to the departmental dose-optimization program which includes automated exposure control, adjustment of the mA and/or kV according to patient size and/or use of iterative reconstruction technique. CONTRAST:  40mL OMNIPAQUE IOHEXOL 350 MG/ML SOLN; 75mL OMNIPAQUE IOHEXOL 350 MG/ML SOLN COMPARISON:  Head CT 0416 hours today. FINDINGS: CT Brain Perfusion Findings: ASPECTS: 10 CBF (<30%) Volume: 0mL.  All CBF in CBV parameters are negative. Perfusion (Tmax>6.0s) volume: 11mL.  Hypoperfusion index of 0 Mismatch Volume: Possibly 11mL Infarction Location:No core infarct detected, abnormal Tmax in the left PCA territory. See below. CTA NECK Skeleton: Face and cervical spine are detailed separately. Grossly intact visible upper thorax. Upper chest: Atelectasis. Mild retained gas and secretions in the thoracic esophagus which is not abnormally dilated. Visible central pulmonary arteries appear patent. Other neck: Superficial right face soft tissue injury reported separately. Aortic arch: Mild to moderate Calcified aortic atherosclerosis. Bovine arch configuration. Right carotid system: Tortuous brachiocephalic artery and right CCA origin with no significant plaque or stenosis. Significant right subclavian venous contrast streak artifact. Visible right CCA is normal. Minimal calcified plaque at the right carotid  bifurcation. Retropharyngeal bifurcation and course of the right ICA with no significant plaque or stenosis to the skull base. Tortuous vessel below the skull base. Left carotid system: Bovine left CCA origin is tortuous with no significant plaque or stenosis. No significant plaque or stenosis at the bifurcation. Tortuous left ICA to the  skull base without stenosis. Vertebral arteries: Tortuous proximal right subclavian artery and normal right vertebral artery origin with no plaque or stenosis. Right vertebral appears somewhat dominant, patent and within normal limits to the skull base. Proximal left subclavian mild plaque and tortuosity without stenosis. Left vertebral artery origin is normal. Tortuous left V1 segment. Mildly non dominant left vertebral artery appears patent to the skull base with no significant plaque or stenosis. CTA HEAD Posterior circulation: Patent distal vertebral arteries, PICA origins, vertebrobasilar junction without plaque or stenosis. Mildly dominant right V4. Patent basilar artery without stenosis. Patent SCA and PCA origins. Both posterior communicating arteries are present. The right PCA branches are within normal limits. There is Mahmud Keithly severe short segment left PCA P1 stenosis best demonstrated on series 7, images 145 and 146. This is just proximal to the left posterior communicating artery junction. But beyond that the left PCA branches are within normal limits. Anterior circulation: Both ICA siphons are patent. No significant siphon plaque or stenosis. Mild calcified plaque is greater on the right. Both posterior communicating and ophthalmic artery origins appear normal. Patent carotid termini. Normal MCA and ACA origins. Anterior communicating artery is mildly ectatic on series 7, image 156 but there is no discrete aneurysm at this time. Bilateral ACA branches are patent with mild irregularity, no significant stenosis. Right MCA M1 segment, right MCA trifurcation and right MCA branches are within normal limits. Left MCA M1 segment and trifurcation are patent without stenosis, mildly ectatic. Left MCA branches are within normal limits. No branch occlusion or stenosis identified. Venous sinuses: Early contrast timing, not evaluated. Anatomic variants: Mildly dominant right vertebral artery. Review of the MIP images  confirms the above findings IMPRESSION: 1. 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 But no core infarct., abnormal CBF or CBV on CT Perfusion. This was discussed by telephone with Dr. Erick Blinks on 07/05/2023 at 0438 hours. 2. CTA is negative for large vessel occlusion. Generalized vessel tortuosity in the head and neck with generally mild for age atherosclerosis. No other hemodynamically significant arterial stenosis. The anterior communicating artery is ectatic but without overt aneurysm. 3.  Aortic Atherosclerosis (ICD10-I70.0). Electronically Signed   By: Odessa Fleming M.D.   On: 07/05/2023 04:51   CT HEAD CODE STROKE WO CONTRAST  Result Date: 07/05/2023 CLINICAL DATA:  Code stroke. 77 year old female (not otherwise specified at the time of this report). EXAM: CT HEAD WITHOUT CONTRAST TECHNIQUE: Contiguous axial images were obtained from the base of the skull through the vertex without intravenous contrast. RADIATION DOSE REDUCTION: This exam was performed according to the departmental dose-optimization program which includes automated exposure control, adjustment of the mA and/or kV according to patient size and/or use of iterative reconstruction technique. COMPARISON:  Head CT 10/10/2007. FINDINGS: Brain: Cerebral volume is within normal limits for age. No midline shift, mass effect, or evidence of intracranial mass lesion. No ventriculomegaly. No acute intracranial hemorrhage identified. Patchy asymmetric frontal horn and anterior white matter capsule hypodensity appears chronic and stable. No cortically based acute infarct identified. Left basal ganglia perivascular space (normal variant). Vascular: Calcified atherosclerosis at the skull base.  No suspicious intracranial vascular hyperdensity. Skull: Acute appearing right orbital floor fracture. Chronic left orbital floor fracture. Calvarium appears intact. Chronic left TMJ  degeneration. Sinuses/Orbits: Layering hemorrhage in the right maxillary sinus. Right orbital floor fracture associated and new since 2009. Other paranasal sinuses well aerated. Tympanic cavities and mastoids well aerated. Other: Right forehead, periorbital, pre malar soft tissue injury with hematoma/contusion. No soft tissue gas identified. No gaze deviation. ASPECTS [facility] Stroke Program Early CT Score) Total score (0-10 with 10 being normal): 10 IMPRESSION: 1. No acute cortically based infarct or acute intracranial hemorrhage identified. ASPECTS 10. Chronic small vessel disease appears stable since 2009. 2. Acute traumatic findings including right orbital floor fracture, scalp and face soft tissue injury. Chronic left orbital floor fracture. 3. These results were communicated to Dr. Derry Lory at 4:24 am on 07/05/2023 by text page via the [facility] messaging system. Electronically Signed   By: Odessa Fleming M.D.   On: 07/05/2023 04:24    Microbiology: No results found for this or any previous visit (from the past 240 hour(s)).   Labs: Basic Metabolic Panel: Recent Labs  Lab 07/05/23 0406 07/05/23 0410 07/06/23 0532 07/07/23 0902 07/08/23 0438 07/09/23 0817  NA 134* 133* 133*  --  138 138  K 3.6 3.5 3.2* 3.9 3.9 3.9  CL 100 100 100  --  108 108  CO2 20*  --  20*  --  21* 23  GLUCOSE 133* 132* 103*  --  98 99  BUN 31* 31* 17  --  20 20  CREATININE 0.88 0.90 0.85  --  0.91 0.74  CALCIUM 9.6  --  8.7*  --  8.7* 8.9  MG  --   --   --   --   --  2.0  PHOS  --   --   --   --   --  4.0   Liver Function Tests: Recent Labs  Lab 07/05/23 0406 07/06/23 0532 07/08/23 1605 07/09/23 0817  AST 26 18 18 19   ALT 21 16 14 15   ALKPHOS 58 47 58 57  BILITOT 0.9 0.6 0.5 0.9  PROT 7.2 6.1* 6.1* 6.4*  ALBUMIN 4.2 3.5 3.6 3.6   No results for input(s): "LIPASE", "AMYLASE" in the last 168 hours. Recent Labs  Lab 07/08/23 1605  AMMONIA 17   CBC: Recent Labs  Lab 07/05/23 0406 07/05/23 0410  07/06/23 0532 07/09/23 0817  WBC 14.9*  --  8.2 5.5  NEUTROABS 13.1*  --   --  3.3  HGB 13.7 15.3* 12.4 13.2  HCT 41.9 45.0 37.5 39.8  MCV 92.3  --  89.7 90.9  PLT 202  --  191 201   Cardiac Enzymes: No results for input(s): "CKTOTAL", "CKMB", "CKMBINDEX", "TROPONINI" in the last 168 hours. BNP: BNP (last 3 results) No results for input(s): "BNP" in the last 8760 hours.  ProBNP (last 3 results) No results for input(s): "PROBNP" in the last 8760 hours.  CBG: Recent Labs  Lab 07/05/23 1621 07/06/23 0140  GLUCAP 86 98       Signed:  Lacretia Nicks MD.  [facility] 07/09/2023, 2:33 PM

## 2023-07-09 NOTE — H&P (Addendum)
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  She remembers the fall stating she tripped over her feet while trying to retrieve her phone that she had dropped.  She was able to call EMS via her smart watch and she was found wandering in her backyard.  Bruising was evident on her nose and right hand and she was presenting with word salad.  No witnesses.  She lives alone.  She was brought in as code stroke and initial MRI was negative for acute finding.  CT head was negative for acute large territory infarct or hypodensity however right orbital floor fracture was noted.  CTA head and neck without LVO.  cEEG performed.  LTM with continuous slow, left hemisphere, maximal left temporal region.  Due to these findings, neurology felt her event was likely due to a seizure. Keppra load was initiated. ENT and ophthalmology were consulted for right orbital floor fracture.  While history was limited due to the patient's aphasia, there were no signs of entrapment and no intervention. She developed visual hallucinations on 11-12 and repeat CT head was performed.  Keppra dose was increased and repeat EEG was performed 11/13.  She continued to endorse intermittent double vision and hallucinations of what appeared to be small blood vessels or branches on the wall appearing as wallpaper.  Repeat MRI with and without contrast obtained.  Keppra was discontinued due to possible side effect of visual hallucinations.  She was started on Depakote.  Aspirin and Plavix were started for 3 weeks then aspirin alone planned.  Lovenox started for DVT prophylaxis.  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  Hemoglobin A1c is 5.2%.  CBC and CMP within normal limits.  2D echo with estimated left ventricular ejection  fraction of 55 to 60%.  Left ventricular diastolic parameters are consistent with grade 1 diastolic dysfunction.  She has no signs or symptoms of heart failure.  Heart healthy diet with thin liquids.  She is requiring min assist for safe transfers and gait with rollator.The patient requires inpatient medicine and rehabilitation evaluations and services for ongoing dysfunction secondary to acute left parietal infarct.   Outpatient seizure precautions should be explained to the patient prior to discharge. Restrictions include no driving until seizure free for 6 months.       ROS: +Hallucinations. + R headache. Positives per HPI above. Denies fevers, chills, N/V, abdominal pain, SOB, chest pain, new weakness or paraesthesias.       Past Medical History:  Diagnosis Date   Arthritis     Diverticulosis     Dyspnea      At pre-op , pt  (a retired Insurance underwriter) reports past Hx of SOB that she found out was being caused by frequent PVCs, she believes the PVCs were related to her poor tolerance of daily statin medication regimen ; however reports resolution of SOB now that she is only taking statin three times a week and taking bystolic to manage dysrhythmia.   GERD (gastroesophageal reflux disease)     History of hiatal hernia     HLD (hyperlipidemia)     Hypertension               Past Surgical History:  Procedure Laterality Date   ABDOMINAL HYSTERECTOMY  CERVICAL DISC SURGERY       COLONOSCOPY   2016   DILATION AND CURETTAGE OF UTERUS       ESOPHAGEAL MANOMETRY N/A 10/03/2013    Procedure: ESOPHAGEAL MANOMETRY (EM);  Surgeon: Beverley Fiedler, MD;  Location: [facility];  Service: Gastroenterology;  Laterality: N/A;   EYE SURGERY        bilateral cataracts    ORBITAL FRACTURE SURGERY       TOTAL KNEE ARTHROPLASTY Left 01/27/2019    Procedure: TOTAL KNEE ARTHROPLASTY;  Surgeon: Durene Romans, MD;  Location: [facility];  Service: Orthopedics;  Laterality: Left;  70 mins             Family  History  Problem Relation Age of Onset   Heart disease Mother     Cancer Mother          colon   Colon cancer Mother     Hypertension Father     Heart disease Father     Lung cancer Father     Cancer Father          lung   Stomach cancer Paternal Grandfather     Esophageal cancer Neg Hx     Rectal cancer Neg Hx     Breast cancer Neg Hx          Social History:  reports that she has never smoked. She has never used smokeless tobacco. She reports current alcohol use of about 7.0 - 10.0 standard drinks of alcohol per week. She reports that she does not use drugs. Allergies:  Allergies       Allergies  Allergen Reactions   Morphine Other (See Comments)      Hypotension; had to be narcan'd . This was 40 years ago   Pantoprazole Cough            Medications Prior to Admission  Medication Sig Dispense Refill   atorvastatin (LIPITOR) 10 MG tablet Take 10 mg by mouth See admin instructions. Take 1 tablet by mouth 4 times a week.       celecoxib (CELEBREX) 200 MG capsule Take 200 mg by mouth daily.       diclofenac (CATAFLAM) 50 MG tablet Take 50 mg by mouth 2 (two) times daily.       hydrochlorothiazide (HYDRODIURIL) 25 MG tablet Take 25 mg by mouth daily.       LINZESS 290 MCG CAPS capsule Take 1 capsule (290 mcg total) by mouth daily before breakfast. (Patient taking differently: Take 290 mcg by mouth daily before breakfast.) 30 capsule 3   nebivolol (BYSTOLIC) 10 MG tablet Take 10 mg by mouth daily.       omeprazole (PRILOSEC OTC) 20 MG tablet Take 20 mg by mouth daily.                  Home: Home Living Family/patient expects to be discharged to:: Private residence Living Arrangements: Alone Available Help at Discharge: Family, Available 24 hours/day Type of Home: House Home Access: Stairs to enter Secretary/administrator of Steps: 1 Home Layout: One level Bathroom Shower/Tub: Health visitor: Standard Additional Comments: may need to confirm info due  to aphasia  Lives With: Alone   Functional History: Prior Function Prior Level of Function : Independent/Modified Independent, Driving, Patient poor historian/Family not available Mobility Comments: no AD ADLs Comments: indep, drives, cooks, cleans   Functional Status:  Mobility: Bed Mobility Overal bed mobility: Needs Assistance Bed Mobility: Supine to Sit, Sit  to Supine Supine to sit: Contact guard Sit to supine: Contact guard assist General bed mobility comments: cues due to impulsity, attempting to come to sitting with tray table across bed and lap Transfers Overall transfer level: Needs assistance Equipment used: Rollator (4 wheels) Transfers: Sit to/from Stand Sit to Stand: Min assist General transfer comment: light min A to steady on rise, from EOB and rollator seat x1 Ambulation/Gait Ambulation/Gait assistance: Min assist Gait Distance (Feet): 200 Feet (x2) Assistive device: Rollator (4 wheels) Gait Pattern/deviations: Step-through pattern, Decreased step length - right, Decreased step length - left, Drifts right/left, Trunk flexed General Gait Details: no instances of bumping into objects, veering R slightly hugging R side of hall, increased postural sway throughout Gait velocity: reduced Gait velocity interpretation: <1.31 ft/sec, indicative of household ambulator   ADL: ADL Overall ADL's : Needs assistance/impaired Eating/Feeding: Independent Grooming: Contact guard assist, Standing Grooming Details (indicate cue type and reason): standing at the sink with occlusion glasses donned. with glasses doffed pt was overshooting all targets - needed cues to problem solve through impiarment (to don glasses) Upper Body Bathing: Set up, Sitting Lower Body Bathing: Contact guard assist, Sit to/from stand Upper Body Dressing : Set up, Sitting Lower Body Dressing: Contact guard assist Toilet Transfer: Contact guard assist, Ambulation Toilet Transfer Details (indicate cue type  and reason): Pt initally attempting to walk without occlusion glasses and without AD, pt extremely dizzy and unsteady. needed education on importance to use AD and don occlusion glasses with immediate improvement in dizziness Toileting- Clothing Manipulation and Hygiene: Supervision/safety, Sit to/from stand Functional mobility during ADLs: Contact guard assist, Rolling walker (2 wheels), Cueing for safety, Cueing for sequencing General ADL Comments: impaired insight, impulsive, problem solving, safety awareness and needs cues.   Cognition: Cognition Overall Cognitive Status: Impaired/Different from baseline Arousal/Alertness: Awake/alert Orientation Level: Oriented X4 Attention: Sustained Sustained Attention: Impaired Sustained Attention Impairment: Verbal basic Memory: Impaired Memory Impairment: Retrieval deficit Awareness: Appears intact Problem Solving: Impaired Problem Solving Impairment: Verbal basic Cognition Arousal: Alert Behavior During Therapy: WFL for tasks assessed/performed Overall Cognitive Status: Impaired/Different from baseline Area of Impairment: Attention, Memory, Following commands, Safety/judgement, Awareness, Problem solving Current Attention Level: Selective Memory: Decreased short-term memory Following Commands: Follows one step commands consistently, Follows multi-step commands inconsistently Safety/Judgement: Decreased awareness of safety, Decreased awareness of deficits Awareness: Emergent Problem Solving: Slow processing, Requires verbal cues General Comments: overall WFL for basic orientation impulsive throughout, limited insight to deficits and problem solving.   Physical Exam: Blood pressure (!) 164/92, pulse 62, temperature 98.1 F (36.7 C), temperature source Oral, resp. rate (!) 8, height 5\' 5"  (1.651 m), weight 93.6 kg, SpO2 100%. Physical Exam   Constitutional: No apparent distress. Appropriate appearance for age.  HENT:  + R eye injection +  R glasses taped. EOMI. PERRLA. Visual fields intact.  Cardiovascular: RRR, no murmurs/rub/gallops. No Edema. Peripheral pulses 2+  Respiratory: CTAB. No rales, rhonchi, or wheezing. On RA.  Abdomen: + bowel sounds, normoactive. No distention or tenderness.  GU: Not examined.   Skin: + R facial bruising + R AC erythema and raised area over prior IV; no warmth, fluctuance, or drainage + R 2nd toe wound - closed  MSK:      R second hammertoe       S/p L TKA       Neurologic exam:  Cognition: AAO to person, place, time and event.  Language: Fluent, No substitutions or neoglisms. No dysarthria. Names 3/3 objects correctly.  Memory: Recalls 3/3  objects at 5 minutes. No apparent deficits  Insight: Good  insight into current condition.  Mood: Pleasant affect, appropriate mood.  Sensation: To light touch intact in BL UEs and LEs  Reflexes: 2+ in BL UE and LEs. Negative Hoffman's and babinski signs bilaterally.  CN: 2-12 grossly intact.  Coordination: No apparent tremors. No ataxia on FTN, HTS bilaterally.  Spasticity: MAS 0 in all extremities.  Strength:                RUE: 5/5 SA, 5/5 EF, 5/5 EE, 5/5 WE, 5/5 FF, 5/5 FA                 LUE: 5/5 SA, 5/5 EF, 5/5 EE, 5/5 WE, 5/5 FF, 5/5 FA                 RLE: 5/5 HF, 5/5 KE, 5/5 DF, 5/5 EHL, 5/5 PF                 LLE:  5/5 HF, 5/5 KE, 5/5 DF, 5/5 EHL, 5/5 PF      Lab Results Last 48 Hours        Results for orders placed or performed during the hospital encounter of 07/05/23 (from the past 48 hour(s))  Basic metabolic panel     Status: Abnormal    Collection Time: 07/08/23  4:38 AM  Result Value Ref Range    Sodium 138 135 - 145 mmol/L    Potassium 3.9 3.5 - 5.1 mmol/L    Chloride 108 98 - 111 mmol/L    CO2 21 (L) 22 - 32 mmol/L    Glucose, Bld 98 70 - 99 mg/dL      Comment: Glucose reference range applies only to samples taken after fasting for at least 8 hours.    BUN 20 8 - 23 mg/dL    Creatinine, Ser 2.59 0.44 - 1.00 mg/dL     Calcium 8.7 (L) 8.9 - 10.3 mg/dL    GFR, Estimated >56 >38 mL/min      Comment: (NOTE) Calculated using the CKD-EPI Creatinine Equation (2021)      Anion gap 9 5 - 15      Comment: Performed at [facility], [address] [place], [place] [address]  Hepatic function panel     Status: Abnormal    Collection Time: 07/08/23  4:05 PM  Result Value Ref Range    Total Protein 6.1 (L) 6.5 - 8.1 g/dL    Albumin 3.6 3.5 - 5.0 g/dL    AST 18 15 - 41 U/L    ALT 14 0 - 44 U/L    Alkaline Phosphatase 58 38 - 126 U/L    Total Bilirubin 0.5 <1.2 mg/dL    Bilirubin, Direct 0.1 0.0 - 0.2 mg/dL    Indirect Bilirubin 0.4 0.3 - 0.9 mg/dL      Comment: Performed at [facility], [address] [place], [place] [address]  Ammonia     Status: None    Collection Time: 07/08/23  4:05 PM  Result Value Ref Range    Ammonia 17 9 - 35 umol/L      Comment: Performed at [facility], [address] [place], [place] [address]  C-reactive protein     Status: None    Collection Time: 07/08/23  8:34 PM  Result Value Ref Range    CRP 0.9 <1.0 mg/dL      Comment: Performed at [facility], [address], [place], [place] [address]  Sedimentation rate     Status: None    Collection Time: 07/08/23  8:34 PM  Result Value Ref Range    Sed Rate 10 0 - 22 mm/hr      Comment: Performed at [facility], [address], [place], [place] [address]  CBC with Differential/Platelet     Status: None    Collection Time: 07/09/23  8:17 AM  Result Value Ref Range    WBC 5.5 4.0 - 10.5 K/uL    RBC 4.38 3.87 - 5.11 MIL/uL    Hemoglobin 13.2 12.0 - 15.0 g/dL    HCT 82.9 56.2 - 13.0 %    MCV 90.9 80.0 - 100.0 fL    MCH 30.1 26.0 - 34.0 pg    MCHC 33.2 30.0 - 36.0 g/dL    RDW 86.5 78.4 - 69.6 %    Platelets 201 150 - 400 K/uL    nRBC 0.0 0.0 - 0.2 %    Neutrophils Relative % 59 %    Neutro Abs 3.3 1.7 - 7.7 K/uL    Lymphocytes Relative 25 %    Lymphs Abs 1.4 0.7 - 4.0 K/uL    Monocytes  Relative 10 %    Monocytes Absolute 0.5 0.1 - 1.0 K/uL    Eosinophils Relative 5 %    Eosinophils Absolute 0.3 0.0 - 0.5 K/uL    Basophils Relative 1 %    Basophils Absolute 0.1 0.0 - 0.1 K/uL    Immature Granulocytes 0 %    Abs Immature Granulocytes 0.02 0.00 - 0.07 K/uL      Comment: Performed at [facility], [address], [place], [place] [address]  Comprehensive metabolic panel     Status: Abnormal    Collection Time: 07/09/23  8:17 AM  Result Value Ref Range    Sodium 138 135 - 145 mmol/L    Potassium 3.9 3.5 - 5.1 mmol/L    Chloride 108 98 - 111 mmol/L    CO2 23 22 - 32 mmol/L    Glucose, Bld 99 70 - 99 mg/dL      Comment: Glucose reference range applies only to samples taken after fasting for at least 8 hours.    BUN 20 8 - 23 mg/dL    Creatinine, Ser 4.13 0.44 - 1.00 mg/dL    Calcium 8.9 8.9 - 24.4 mg/dL    Total Protein 6.4 (L) 6.5 - 8.1 g/dL    Albumin 3.6 3.5 - 5.0 g/dL    AST 19 15 - 41 U/L    ALT 15 0 - 44 U/L    Alkaline Phosphatase 57 38 - 126 U/L    Total Bilirubin 0.9 <1.2 mg/dL    GFR, Estimated >01 >02 mL/min      Comment: (NOTE) Calculated using the CKD-EPI Creatinine Equation (2021)      Anion gap 7 5 - 15      Comment: Performed at [facility], [address] [place], [place] [address]  Magnesium     Status: None    Collection Time: 07/09/23  8:17 AM  Result Value Ref Range    Magnesium 2.0 1.7 - 2.4 mg/dL      Comment: Performed at [facility], [address], [place], [place] [address]  Phosphorus     Status: None    Collection Time: 07/09/23  8:17 AM  Result Value Ref Range  Phosphorus 4.0 2.5 - 4.6 mg/dL      Comment: Performed at [facility], [address] [place], [place] [address]       Imaging Results (Last 48 hours)  ECHOCARDIOGRAM COMPLETE   Result Date: 07/09/2023    ECHOCARDIOGRAM REPORT   Patient Name:   [facility] Date of Exam: 07/09/2023 Medical Rec #:  [id number]           Height:        65.0 in Accession #:    [id number]          Weight:       206.3 lb Date of Birth:  [birth year]           BSA:          2.005 m Patient Age:    77 years            BP:           180/76 mmHg Patient Gender: F                   HR:           67 bpm. Exam Location:  Inpatient Procedure: 2D Echo, Cardiac Doppler and Color Doppler Indications:    Stroke  History:        Patient has prior history of Echocardiogram examinations, most                 recent 05/15/2022. Risk Factors:Hypertension.  Sonographer:    Darlys Gales Referring Phys: [id number] Lynnae January IMPRESSIONS  1. Left ventricular ejection fraction, by estimation, is 55 to 60%. The left ventricle has normal function. The left ventricle has no regional wall motion abnormalities. There is mild left ventricular hypertrophy. Left ventricular diastolic parameters are consistent with Grade I diastolic dysfunction (impaired relaxation).  2. Right ventricular systolic function is normal. The right ventricular size is normal. There is normal pulmonary artery systolic pressure.  3. Left atrial size was mildly dilated.  4. The mitral valve is normal in structure. Trivial mitral valve regurgitation. No evidence of mitral stenosis.  5. The aortic valve is tricuspid. Aortic valve regurgitation is not visualized. No aortic stenosis is present.  6. The inferior vena cava is normal in size with greater than 50% respiratory variability, suggesting right atrial pressure of 3 mmHg. FINDINGS  Left Ventricle: Left ventricular ejection fraction, by estimation, is 55 to 60%. The left ventricle has normal function. The left ventricle has no regional wall motion abnormalities. The left ventricular internal cavity size was normal in size. There is  mild left ventricular hypertrophy. Left ventricular diastolic parameters are consistent with Grade I diastolic dysfunction (impaired relaxation). Normal left ventricular filling pressure. Right Ventricle: The right ventricular size is normal. No  increase in right ventricular wall thickness. Right ventricular systolic function is normal. There is normal pulmonary artery systolic pressure. The tricuspid regurgitant velocity is 2.47 m/s, and  with an assumed right atrial pressure of 3 mmHg, the estimated right ventricular systolic pressure is 27.4 mmHg. Left Atrium: Left atrial size was mildly dilated. Right Atrium: Right atrial size was normal in size. Pericardium: There is no evidence of pericardial effusion. Mitral Valve: The mitral valve is normal in structure. Trivial mitral valve regurgitation. No evidence of mitral valve stenosis. Tricuspid Valve: The tricuspid valve is normal in structure. Tricuspid valve regurgitation is trivial. No evidence of tricuspid stenosis. Aortic Valve: The aortic valve is tricuspid. Aortic valve regurgitation is not  visualized. No aortic stenosis is present. Aortic valve mean gradient measures 4.0 mmHg. Aortic valve peak gradient measures 7.4 mmHg. Aortic valve area, by VTI measures 1.72 cm. Pulmonic Valve: The pulmonic valve was normal in structure. Pulmonic valve regurgitation is not visualized. No evidence of pulmonic stenosis. Aorta: The aortic root is normal in size and structure. Venous: The inferior vena cava is normal in size with greater than 50% respiratory variability, suggesting right atrial pressure of 3 mmHg. IAS/Shunts: No atrial level shunt detected by color flow Doppler.  LEFT VENTRICLE PLAX 2D LVIDd:         3.60 cm   Diastology LVIDs:         2.60 cm   LV e' medial:    6.85 cm/s LV PW:         1.10 cm   LV E/e' medial:  9.6 LV IVS:        1.30 cm   LV e' lateral:   7.51 cm/s LVOT diam:     1.80 cm   LV E/e' lateral: 8.8 LV SV:         58 LV SV Index:   29 LVOT Area:     2.54 cm  RIGHT VENTRICLE RV S prime:     13.10 cm/s TAPSE (M-mode): 2.4 cm LEFT ATRIUM             Index        RIGHT ATRIUM           Index LA Vol (A2C):   62.8 ml 31.33 ml/m  RA Area:     19.70 cm LA Vol (A4C):   56.6 ml 28.24 ml/m   RA Volume:   53.70 ml  26.79 ml/m LA Biplane Vol: 62.1 ml 30.98 ml/m  AORTIC VALVE AV Area (Vmax):    1.84 cm AV Area (Vmean):   1.79 cm AV Area (VTI):     1.72 cm AV Vmax:           136.00 cm/s AV Vmean:          103.000 cm/s AV VTI:            0.338 m AV Peak Grad:      7.4 mmHg AV Mean Grad:      4.0 mmHg LVOT Vmax:         98.20 cm/s LVOT Vmean:        72.500 cm/s LVOT VTI:          0.228 m LVOT/AV VTI ratio: 0.67  AORTA Ao Root diam: 2.70 cm Ao Asc diam:  3.60 cm MITRAL VALVE               TRICUSPID VALVE MV Area (PHT): 2.07 cm    TR Peak grad:   24.4 mmHg MV Decel Time: 366 msec    TR Vmax:        247.00 cm/s MV E velocity: 66.00 cm/s MV A velocity: 99.60 cm/s  SHUNTS MV E/A ratio:  0.66        Systemic VTI:  0.23 m                            Systemic Diam: 1.80 cm Chilton Si MD Electronically signed by Chilton Si MD Signature Date/Time: 07/09/2023/11:20:53 AM    Final     MR BRAIN W WO CONTRAST   Result Date: 07/08/2023 CLINICAL DATA:  Meningitis/CNS infection suspected. Visual hallucinations. Recent fall. Expressive  aphasia. EXAM: MRI HEAD WITHOUT AND WITH CONTRAST TECHNIQUE: Multiplanar, multiecho pulse sequences of the brain and surrounding structures were obtained without and with intravenous contrast. CONTRAST:  10mL GADAVIST GADOBUTROL 1 MMOL/ML IV SOLN COMPARISON:  CT earlier same day.  MRI 3 days ago. FINDINGS: Brain: Chronic small-vessel ischemic changes affect pons. No focal cerebellar insult. Cerebral hemispheres show moderate chronic small-vessel ischemic changes of the white matter. Punctate acute infarction affecting the left parietal cortex. As an isolated finding, this could be incidental. No evidence of mass, hemorrhage, hydrocephalus or extra-axial collection. After contrast administration, no abnormal brain or leptomeningeal enhancement occurs. Vascular: Major vessels at the base of the brain show flow. Skull and upper cervical spine: Negative Sinuses/Orbits: Sinuses  are clear. Known orbital blowout fracture of the orbital floor on the right. Other: None IMPRESSION: 1. Punctate (1 mm) acute infarction affecting the left parietal cortex. As an isolated finding, this could be incidental. 2. Chronic small-vessel ischemic changes of the pons and cerebral hemispheric white matter. 3. Known orbital blowout fracture of the orbital floor on the right. 4. No imaging finding to suggest the diagnosis of meningitis. Electronically Signed   By: Paulina Fusi M.D.   On: 07/08/2023 14:26    CT HEAD WO CONTRAST ( )   Result Date: 07/08/2023 CLINICAL DATA:  Follow-up examination for stroke. EXAM: CT HEAD WITHOUT CONTRAST TECHNIQUE: Contiguous axial images were obtained from the base of the skull through the vertex without intravenous contrast. RADIATION DOSE REDUCTION: This exam was performed according to the departmental dose-optimization program which includes automated exposure control, adjustment of the mA and/or kV according to patient size and/or use of iterative reconstruction technique. COMPARISON:  Prior MRI 07/05/2023. FINDINGS: Brain: Cerebral volume within normal limits. Patchy hypodensity involving the supratentorial cerebral white matter, consistent with chronic microvascular ischemic disease, moderate in nature. No acute intracranial hemorrhage. No acute large vessel territory infarct. No mass lesion or midline shift. No hydrocephalus or extra-axial fluid collection. Vascular: No abnormal hyperdense vessel. Skull: Scalp soft tissues demonstrate no acute finding. Calvarium intact. Sinuses/Orbits: Posttraumatic findings about the partially visualized right face and orbit again noted. No new finding. Paranasal sinuses are otherwise largely clear. Mastoid air cells and middle ear cavities are well pneumatized and free of fluid. 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 IMPRESSION: 1. No acute intracranial abnormality. 2. Moderate chronic microvascular  ischemic disease. 3. Posttraumatic findings about the partially visualized right face and orbit, described on recent maxillofacial CT. Electronically Signed   By: Rise Mu M.D.   On: 07/08/2023 06:27           Blood pressure (!) 164/92, pulse 62, temperature 98.1 F (36.7 C), temperature source Oral, resp. rate (!) 8, height 5\' 5"  (1.651 m), weight 93.6 kg, SpO2 100%.   Medical Problem List and Plan: 1. 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  Antithrombotics: -DVT/anticoagulation:  Pharmaceutical: Lovenox             -antiplatelet therapy: Aspirin and Plavix for three weeks followed by aspirin alone   3. Pain Management:  Tylenol, Fioricet as needed   4. Mood/Behavior/Sleep: LCSW to evaluate and provide emotional support             -antipsychotic agents: n/a   5. Neuropsych/cognition: This patient is capable of making decisions on her own behalf.   6. Skin/Wound Care: Routine skin care checks   7. Fluids/Electrolytes/Nutrition: Routine Is and Os and follow-up chemistries             -on heart healthy diet with thin liquids             -continue folic acid and MVI   8: New onset seizure:             -continue Depakote 500 mg every 8 hours             -Follow-up neurology as outpatient   - EEG 11/14 results pending   9: Hyperlipidemia: continue statin   10: Headache: Fioricet as needed   11: Visual disturbance/hallucinations: changed from Keppra to Depakote on 11/13 (repeat MRI 11/13 acute punctate infarction left parietal cortex)   - Ongoing 11/14              12: Orbital floor fracture OD (no entrapment): -Fredderick Phenix, MD (ophthalmology) and Nelva Nay. Elijah Birk, MD  (ENT) consulted on 11/10.  Recommend to reconsult in 1 week per hospitalist, Dr. Lowell Guitar.   13: IBS/C: reordered Linzess  (30-60 minutes before breakfast); patient wants control of this med, discussed with nursing and pharmacy   14: History of hypertension: home Bystolic 10 mg and hydrochlorothiazide 20 mg currently held for permissive HTN   - Add PRN hydralazine 10 mg Q8H for Systolic >190, Diastolic >110    Milinda Antis, PA-C 07/09/2023  I have examined the patient independently and edited the note for HPI, ROS, exam, assessment, and plan as appropriate. I am in agreement with the above recommendations.   Angelina Sheriff, DO 07/09/2023

## 2023-07-09 NOTE — Progress Notes (Signed)
NEUROLOGY CONSULT FOLLOW UP NOTE   Date of service: July 09, 2023 Patient Name: Chloe Holmes MRN:  [id number] DOB:  [birth year]  Brief HPI  Ayano Philemon is a 77 y.o. female  has a past medical history of Arthritis, Diverticulosis, Dyspnea, GERD (gastroesophageal reflux disease), History of hiatal hernia, HLD (hyperlipidemia), and Hypertension. who presented with  fall hitting the front of her head following which she was aphasic.     Interval Hx/subjective   Patient is awake and alert in NAD. She endorses still having vivid movie like hallucinations with eyes closed. With eyes open she states the walls look like vessels on the eye and the ceiling has pink and yellow splotches  Denies headache at this time. Neurological exam is stable and unchanged.   Vitals   Vitals:   07/08/23 1951 07/09/23 0057 07/09/23 0411 07/09/23 0726  BP: (!) 163/74 (!) 162/80 (!) 157/80 (!) 180/76  Pulse: 66 (!) 59 (!) 58 63  Resp: 18 20 16 18   Temp: 98.4 F (36.9 C) 97.7 F (36.5 C) 98.2 F (36.8 C) 97.8 F (36.6 C)  TempSrc: Oral Oral  Oral  SpO2: 98% 99% 100% 98%  Weight:      Height:         Body mass index is 34.34 kg/m.  Physical Exam   Constitutional: Appears well-developed and well-nourished.  Psych: Affect appropriate to situation.  Eyes: No scleral injection.  HENT: No OP obstrucion. Bruising around left eye and left chin Head: Normocephalic.  Cardiovascular: Normal rate and regular rhythm.  Respiratory: Effort normal, non-labored breathing.  GI: Soft.  No distension. There is no tenderness.  Skin: WDI.   Neurologic Examination   Mental Status -  Level of arousal and orientation to time, place, and person were intact.  Language including expression, naming, repetition, comprehension was assessed and found intact . Attention span and concentration were normal. Recent and remote memory were intact .  Cranial Nerves II - XII - II - Visual fields without glasses,  decreased midline vision on both eyes. Subjectively states she is having intermittent double vision and having trouble reading. Hallucinations of "vessels/branches", "splotches of yellow and pink colors"  seen with both eyes. And vivid movie like pictures when she closes her eyes  III, IV, VI - Extraocular movements intact . V - Facial sensation intact bilaterally . VII - Facial movement intact bilaterally . VIII - Hearing & vestibular intact bilaterally . X - Palate elevates symmetrically . XI - Chin turning & shoulder shrug intact bilaterally . XII - Tongue protrusion intact .  Motor Strength - The patient's strength was normal in all extremities and pronator drift was absent.  Bulk was normal and fasciculations were absent .   Motor Tone - Muscle tone was assessed at the neck and appendages and was normal . Sensory - Light touch, temperature/pinprick were assessed and were symmetrical.   Coordination - The patient had normal movements in the hands and feet with no ataxia or dysmetria.  Tremor was absent. Gait and Station - steady with walker  Labs and Diagnostic Imaging   CBC:  Recent Labs  Lab 07/05/23 0406 07/05/23 0410 07/06/23 0532 07/09/23 0817  WBC 14.9*  --  8.2 5.5  NEUTROABS 13.1*  --   --  3.3  HGB 13.7   < > 12.4 13.2  HCT 41.9   < > 37.5 39.8  MCV 92.3  --  89.7 90.9  PLT 202  --  191 201   < > =  values in this interval not displayed.    Basic Metabolic Panel:  Lab Results  Component Value Date   NA 138 07/09/2023   K 3.9 07/09/2023   CO2 23 07/09/2023   GLUCOSE 99 07/09/2023   BUN 20 07/09/2023   CREATININE 0.74 07/09/2023   CALCIUM 8.9 07/09/2023   GFRNONAA >60 07/09/2023   GFRAA >60 01/28/2019   Lipid Panel:  Lab Results  Component Value Date   LDLCALC 94 07/05/2023   HgbA1c:  Lab Results  Component Value Date   HGBA1C 5.2 07/05/2023   Urine Drug Screen:     Component Value Date/Time   LABOPIA NONE DETECTED 07/05/2023 1525   COCAINSCRNUR  NONE DETECTED 07/05/2023 1525   LABBENZ NONE DETECTED 07/05/2023 1525   AMPHETMU NONE DETECTED 07/05/2023 1525   THCU NONE DETECTED 07/05/2023 1525   LABBARB NONE DETECTED 07/05/2023 1525    Alcohol Level     Component Value Date/Time   ETH <10 07/05/2023 0406   INR  Lab Results  Component Value Date   INR 1.0 07/05/2023   APTT  Lab Results  Component Value Date   APTT 28 07/05/2023   AED levels: No results found for: "PHENYTOIN", "ZONISAMIDE", "LAMOTRIGINE", "LEVETIRACETA"  AST: 18 ALT: 14 Ammonia: 17  CT Head without contrast(Personally reviewed): No acute process, aspects 10   CT angio Head and Neck with contrast(Personally reviewed): 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  Chronic small-vessel ischemic changes of pons and cerebral white matter No imaging finding to support diagnosis of meningitis.   LTM EEG 11/11: This study is suggestive of cortical dysfunction arising from left hemisphere, maximal left temporal region likely secondary to underlying structural abnormality, post-ictal state. No seizures or epileptiform discharges were seen throughout the recording.  LTM 11/12: This study is suggestive of cortical dysfunction arising from left temporal region. No seizures or epileptiform discharges were seen throughout the recording.    2D echo:  1. Left ventricular ejection fraction, by estimation, is 55 to 60%. The  left ventricle has normal function. The left ventricle has no regional  wall motion abnormalities. There is mild left ventricular hypertrophy.  Left ventricular diastolic parameters are consistent with Grade I  diastolic dysfunction (impaired relaxation).   2. Right ventricular systolic function is normal. The right  ventricular  size is normal. There is normal pulmonary artery systolic pressure.   3. Left atrial size was mildly dilated.   4. The mitral valve is normal in structure. Trivial mitral valve  regurgitation. No evidence of mitral stenosis.   5. The aortic valve is tricuspid. Aortic valve regurgitation is not  visualized. No aortic stenosis is present.   6. The inferior vena cava is normal in size with greater than 50%  respiratory variability, suggesting right atrial pressure of 3 mmHg.   LDL 94 A1c 5.2  Impression  Palmyra Gani is a 77 y.o. female who presents after having a fall and hitting her head followed by aphasia. Patient remembers falling, stating she tripped over her feet trying to get to her phone that she had dropped. She presented in an encephalopathy/post-ictal type state. Initial MRI brain was negative for acute finding. Patient now endorses hallucinations of vessels or branches since last night. While it is rare, hallucinations can be a side effect of Keppra.  We will change this to Depakote to see if this change corrects the hallucinations. We ordered a repeat MRI with and without contrast. Results show a punctate acute infarction in the left parietal cortex. No imaging finding to support diagnosis of meningitis - Overall impression: Fall with probable new onset seizure given left hemispheric slowing on EEG - Acute punctate left parietal cortex stroke - Stroke work up complete   Recommendations  - Continue Seizure precautions  - Conitnue Depakote 500mg  Q8 hrs - Frequent Neuro checks per stroke protocol - Increase atorvastatin to  20mg  for goal LDL>70  - Continue Aspirin and Plavix for 3 weeks, then aspirin alone - DVT ppx - currently on Lovenox - BP management - Telemetry monitoring for arrhythmia  - PT/OT/SLP  ______________________________________________________________________   Thank you for the opportunity to take part in the care of this patient. If you  have any further questions, please contact the neurology consultation team on call. Updated oncall schedule is listed on AMION.   Pt seen by Neuro NP/APP and later by MD. Note/plan to be edited by MD as needed.    Gevena Mart DNP, ACNPC-AG  [facility]  Electronically signed: Dr. Caryl Pina

## 2023-07-09 NOTE — TOC Transition Note (Signed)
Transition of Care [facility]) - CM/SW Discharge Note   Patient Details  Name: Chloe Holmes MRN: [id number] Date of Birth: 18-Apr-1946  [facility]) CM/SW Contact:  Kermit Balo, RN Phone Number: 07/09/2023, 2:36 PM   Clinical Narrative:     Patient is discharging to CIR today. CM signing off.    Final next level of care: IP Rehab Facility Barriers to Discharge: No Barriers Identified   Patient Goals and CMS Choice CMS Medicare.gov Compare Post Acute Care list provided to:: Patient Choice offered to / list presented to : Patient  Discharge Placement                         Discharge Plan and Services Additional resources added to the After Visit Summary for     Discharge Planning Services: CM Consult                                 Social Determinants of Health (SDOH) Interventions SDOH Screenings   Food Insecurity: No Food Insecurity (07/06/2023)  Housing: Low Risk  (07/06/2023)  Transportation Needs: No Transportation Needs (07/06/2023)  Utilities: Not At Risk (07/06/2023)  Tobacco Use: Low Risk  (07/05/2023)     Readmission Risk Interventions     No data to display

## 2023-07-09 NOTE — Discharge Summary (Signed)
Physician Discharge Summary  Patient ID: Chloe Holmes MRN: [id number] DOB/AGE: November 28, 1945 77 y.o.  Admit date: 07/09/2023 Discharge date: 07/17/2023  Discharge Diagnoses:  Principal Problem:   Seizure disorder [facility]) Active problems: Functional deficits secondary to seizure disorder Right temporal bone fracture Acute left parietal infarction History of hypertension History of IBS with constipation History of hyperlipidemia Obesity  Discharged Condition: good  Significant Diagnostic Studies: none  Labs:  Basic Metabolic Panel: Recent Labs  Lab 07/13/23 0459  NA 134*  K 3.9  CL 102  CO2 25  GLUCOSE 85  BUN 19  CREATININE 0.85  CALCIUM 8.7*    CBC: Recent Labs  Lab 07/13/23 0459  WBC 4.7  HGB 12.3  HCT 37.3  MCV 90.5  PLT 196   Brief HPI:   Chloe Holmes is a 77 y.o. female who presented to the emergency department on 07/05/2023 after a fall at home and hitting her head. She remembers the fall stating she tripped over her feet while trying to retrieve her phone that she had dropped. She was able to call EMS via her smart watch and she was found wandering in her backyard. Bruising was evident on her nose and right hand and she was presenting with word salad. No witnesses. She lives alone. She was brought in as code stroke and initial MRI was negative for acute finding. CT head was negative for acute large territory infarct or hypodensity however right orbital floor fracture was noted. CTA head and neck without LVO. cEEG performed. LTM with continuous slow, left hemisphere, maximal left temporal region. Due to these findings, neurology felt her event was likely due to a seizure. Keppra load was initiated. ENT and ophthalmology were consulted for right orbital floor fracture. While history was limited due to the patient's aphasia, there were no signs of entrapment and no intervention. She developed visual hallucinations on 11-12 and repeat CT head was performed.  Keppra dose was increased and repeat EEG was performed 11/13. She continued to endorse intermittent double vision and hallucinations of what appeared to be small blood vessels or branches on the wall appearing as wallpaper. Repeat MRI with and without contrast obtained. Keppra was discontinued due to possible side effect of visual hallucinations. She was started on Depakote. Aspirin and Plavix were started for 3 weeks then aspirin alone planned. Lovenox started for DVT prophylaxis. 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 Hemoglobin A1c is 5.2%. CBC and CMP within normal limits. 2D echo with estimated left ventricular ejection fraction of 55 to 60%. Left ventricular diastolic parameters are consistent with grade 1 diastolic dysfunction. She has no signs or symptoms of heart failure. Heart healthy diet with thin liquids. She is requiring min assist for safe transfers and gait with rollator.    Hospital Course: Chloe Holmes was admitted to rehab 07/09/2023 for inpatient therapies to consist of PT, ST and OT at least three hours five days a week. Past admission physiatrist, therapy team and rehab RN have worked together to provide customized collaborative inpatient rehab.   Blood pressures were monitored on TID basis and home medicines held.  Hydralazine 10 mg every 8 hours for elevated blood pressure per parameters added.  Bystolic restarted 2.5 mg daily on 11/15.  EEG on 11/14 with left cortical dysfunction but otherwise unremarkable.  This was discussed with the patient regarding seizure treatment.  Neurology feels she will need to be on prophylaxis lifelong.  She reported persistent nausea associated with Depakote.  Valproic acid levels checked as well as LFTs.  Patient was seen by Dr. Ernestene Kiel for ENT follow-up on 11/17. Labs stable/LFTS within normal limits 11/18.   Rehab course: During patient's stay in rehab weekly team conferences were held to monitor patient's  progress, set goals and discuss barriers to discharge. At admission, patient required supervision with basic self-care skills and min assist with mobility.  She has had improvement in activity tolerance, balance, postural control as well as ability to compensate for deficits. She has had improvement in functional use RUE/LUE  and RLE/LLE as well as improvement in awareness Patient has met 11 of 11 long term goals due to improved activity tolerance, improved balance, ability to compensate for deficits, and improved coordination.  Patient to discharge at overall Modified Independent level.  Patient's care partner is independent to provide the necessary physical assistance at discharge with driving and running errands.   Outpatient PT arranged on neuro op.  Discharge disposition: 01-Home or Self Care     Diet: Heart healthy  Special Instructions: No driving, alcohol consumption or tobacco use.  Discussed driving post seizure .None per state low until seizure free x 6 months, must be cleared by neurologist.  Aspirin and Plavix for total of 3 weeks followed by aspirin alone.  Follow-up with Dr. Ernestene Kiel in 1 month for repeat evaluation.  Discharge Instructions     Discharge patient   Complete by: As directed    Discharge disposition: 01-Home or Self Care   Discharge patient date: 07/17/2023      Allergies as of 07/17/2023       Reactions   Morphine Other (See Comments)   Hypotension; had to be narcan'd . This was 40 years ago   Pantoprazole Cough        Medication List     STOP taking these medications    celecoxib 200 MG capsule Commonly known as: CELEBREX   hydrochlorothiazide 25 MG tablet Commonly known as: HYDRODIURIL   omeprazole 20 MG tablet Commonly known as: PRILOSEC OTC       TAKE these medications    acetaminophen 325 MG tablet Commonly known as: TYLENOL Take 1-2 tablets (325-650 mg total) by mouth every 4 (four) hours as needed for mild pain (pain  score 1-3).   aspirin EC 81 MG tablet Take 1 tablet (81 mg total) by mouth daily. Swallow whole.   atorvastatin 20 MG tablet Commonly known as: LIPITOR Take 1 tablet (20 mg total) by mouth daily.   clopidogrel 75 MG tablet Commonly known as: PLAVIX Take 1 tablet (75 mg total) by mouth daily for 12 days. Start taking on: July 18, 2023   divalproex 500 MG DR tablet Commonly known as: DEPAKOTE Take 1 tablet (500 mg total) by mouth every 12 (twelve) hours. What changed: when to take this   Linzess 290 MCG Caps capsule Generic drug: linaclotide Take 1 capsule (290 mcg total) by mouth daily before breakfast. What changed: See the new instructions.   nebivolol 5 MG tablet Commonly known as: BYSTOLIC Take 1 tablet (5 mg total) by mouth daily. What changed:  medication strength how much to take        Follow-up Information     Creola Corn, MD. Call.   Specialty: Internal Medicine Contact information: [address] [place] [address] [phone]         [facility]. Schedule an appointment as soon as possible for a visit.   Why: Call the office in 1-2 days to  make arrangements for hospital follow-up appointment. Contact information: [address]     [address] [place] [address] [phone]        Erick Colace, MD Follow up.   Specialty: Physical Medicine and Rehabilitation Why: As needed Contact information: [address] [place] [address] [phone]         Scarlette Ar, MD. Call.   Specialty: Otolaryngology Why: Follow-up in one month. Contact information: [address] [place] [address] [phone]                 Signed: Milinda Antis 07/17/2023, 9:38 AM

## 2023-07-09 NOTE — PMR Pre-admission (Signed)
PMR Admission Coordinator Pre-Admission Assessment  Patient: Chloe Holmes is an 77 y.o., female MRN: [id number] DOB: September 14, 1945 Height: 5\' 5"  (165.1 cm) Weight: 93.6 kg  Insurance Information HMO:     PPO:      PCP:      IPA:      80/20:      OTHER:  PRIMARY: Medicare A/B      Policy#: [id number]       Subscriber: pt CM Name:       Phone#:      Fax#:  Pre-Cert#: verified Health and safety inspector:  Benefits:  Phone #:      Name:  Eff. Date: A/B 12/23/2009     Deduct: $1632      Out of Pocket Max: n/a      Life Max: n/a CIR: 100%      SNF: 20 full days Outpatient: 80%     Co-Pay: 20% Home Health: 100%      Co-Pay:  DME: 80%     Co-Pay: 20% Providers:  SECONDARY: BCBS Supplement      Policy#: [id number]     Phone#: [phone]  Financial Counselor:       Phone#:   The "Data Collection Information Summary" for patients in Inpatient Rehabilitation Facilities with attached "Privacy Act Statement-Health Care Records" was provided and verbally reviewed with: Patient and Family  Emergency Contact Information Contact Information     Name Relation Home Work [place] Son   [phone]      Other Contacts     Name Relation Home Work Mobile   James,Nicole Relative   [phone]   Lorre Munroe [phone]  [phone]   Particia Lather Daughter   [phone]       Current Medical History  Patient Admitting Diagnosis: CVA   History of Present Illness: Pt is a 77 y/o female with PMH of HTN admitted to [facility] on 11/10 following a fall at home with AMS/aphasia.  In ed, SBP 176 but otherwise hemodynamically stable.  Labs significant for WBC 14.9, sodium 134, BUN 31, creatinine .88, INR 1.  CT head and face showed no acute cortical based infarct or signs of hemorrhage, but did reveal right orbital blowout fracture.  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  Admitted for workup.  Neurology recommended Keppra  and MRI/EEG.  MRI initially negative and EEG consistent for dysfunction arising from the left temporal lobe.  Pt developed consistent visual disturbances and MRI was repeated, showed small L PCA infarct.  Changed keppra to depakote.  Also with Optho and ENT consults both exams limited by pt presentation and recommend f/u at a later date.  Therapy evaluations completed and pt was recommended for CIR.   Complete NIHSS TOTAL: 1  Patient's medical record from [facility] has been reviewed by the rehabilitation admission coordinator and physician.  Past Medical History  Past Medical History:  Diagnosis Date   Arthritis    Diverticulosis    Dyspnea    At pre-op , pt  (a retired Insurance underwriter) reports past Hx of SOB that she found out was being caused by frequent PVCs, she believes the PVCs were related to her poor tolerance of daily statin medication regimen ; however reports resolution of SOB now that she is only taking statin three times a week and taking bystolic to manage dysrhythmia.   GERD (gastroesophageal reflux disease)    History of hiatal  hernia    HLD (hyperlipidemia)    Hypertension     Has the patient had major surgery during 100 days prior to admission? No  Family History   family history includes Cancer in her father and mother; Colon cancer in her mother; Heart disease in her father and mother; Hypertension in her father; Lung cancer in her father; Stomach cancer in her paternal grandfather.  Current Medications  Current Facility-Administered Medications:    acetaminophen (TYLENOL) tablet 650 mg, 650 mg, Oral, Q4H PRN, 650 mg at 07/06/23 2114 **OR** acetaminophen (TYLENOL) 160 MG/5ML solution 650 mg, 650 mg, Per Tube, Q4H PRN **OR** acetaminophen (TYLENOL) suppository 650 mg, 650 mg, Rectal, Q4H PRN, Katrinka Blazing, Rondell A, MD, 650 mg at 07/05/23 1520   aspirin EC tablet 81 mg, 81 mg, Oral, Daily, Richardo Priest, Erin C, NP, 81 mg at 07/08/23 2037   butalbital-acetaminophen-caffeine (FIORICET)  50-325-40 MG per tablet 1 tablet, 1 tablet, Oral, Q6H PRN, Madelyn Flavors A, MD, 1 tablet at 07/07/23 7829   clopidogrel (PLAVIX) tablet 75 mg, 75 mg, Oral, Daily, Richardo Priest, Erin C, NP, 75 mg at 07/08/23 2036   divalproex (DEPAKOTE) DR tablet 500 mg, 500 mg, Oral, Q8H, Lehner, Erin C, NP, 500 mg at 07/09/23 0625   enoxaparin (LOVENOX) injection 40 mg, 40 mg, Subcutaneous, Q24H, Smith, Rondell A, MD, 40 mg at 07/08/23 2143   fentaNYL (SUBLIMAZE) injection 12.5 mcg, 12.5 mcg, Intravenous, Q2H PRN, Katrinka Blazing, Rondell A, MD   folic acid (FOLVITE) tablet 1 mg, 1 mg, Oral, Daily, Narda Bonds, MD, 1 mg at 07/08/23 0855   LORazepam (ATIVAN) tablet 1-4 mg, 1-4 mg, Oral, Q1H PRN, Narda Bonds, MD   multivitamin with minerals tablet 1 tablet, 1 tablet, Oral, Daily, Narda Bonds, MD, 1 tablet at 07/08/23 0855   senna-docusate (Senokot-S) tablet 1 tablet, 1 tablet, Oral, QHS PRN, Madelyn Flavors A, MD   thiamine (VITAMIN B1) tablet 100 mg, 100 mg, Oral, Daily, 100 mg at 07/08/23 0855 **OR** thiamine (VITAMIN B1) injection 100 mg, 100 mg, Intravenous, Daily, Nettey, Howell Pringle, MD   white petrolatum (VASELINE) gel, , Topical, PRN, Narda Bonds, MD  Patients Current Diet:  Diet Order             Diet Heart Room service appropriate? Yes with Assist; Fluid consistency: Thin  Diet effective now                   Precautions / Restrictions Precautions Precautions: Fall Precaution Comments: diplopia Restrictions Weight Bearing Restrictions: No   Has the patient had 2 or more falls or a fall with injury in the past year? Yes  Prior Activity Level Community (5-7x/wk): indep without device, driving, iADLs, retired  Prior Functional Level Self Care: Did the patient need help bathing, dressing, using the toilet or eating? Independent  Indoor Mobility: Did the patient need assistance with walking from room to room (with or without device)? Independent  Stairs: Did the patient need assistance with  internal or external stairs (with or without device)? Independent  Functional Cognition: Did the patient need help planning regular tasks such as shopping or remembering to take medications? Independent  Patient Information Are you of Hispanic, Latino/a,or Spanish origin?: A. No, not of Hispanic, Latino/a, or Spanish origin What is your race?: A. White Do you need or want an interpreter to communicate with a doctor or health care staff?: 0. No  Patient's Response To:  Health Literacy and Transportation Is the patient able to respond  to health literacy and transportation needs?: Yes Health Literacy - How often do you need to have someone help you when you read instructions, pamphlets, or other written material from your doctor or pharmacy?: Never In the past 12 months, has lack of transportation kept you from medical appointments or from getting medications?: No In the past 12 months, has lack of transportation kept you from meetings, work, or from getting things needed for daily living?: No  Home Assistive Devices / Equipment    Prior Device Use: Indicate devices/aids used by the patient prior to current illness, exacerbation or injury? None of the above  Current Functional Level Cognition  Arousal/Alertness: Awake/alert Overall Cognitive Status: Impaired/Different from baseline Current Attention Level: Selective Orientation Level: Oriented X4 Following Commands: Follows one step commands consistently, Follows multi-step commands inconsistently Safety/Judgement: Decreased awareness of safety, Decreased awareness of deficits General Comments: overall WFL for basic orientation impulsive throughout, limited insight to deficits and problem solving. Attention: Sustained Sustained Attention: Impaired Sustained Attention Impairment: Verbal basic Memory: Impaired Memory Impairment: Retrieval deficit Awareness: Appears intact Problem Solving: Impaired Problem Solving Impairment: Verbal  basic    Extremity Assessment (includes Sensation/Coordination)  Upper Extremity Assessment: Overall WFL for tasks assessed  Lower Extremity Assessment: Defer to PT evaluation    ADLs  Overall ADL's : Needs assistance/impaired Eating/Feeding: Independent Grooming: Contact guard assist, Standing Grooming Details (indicate cue type and reason): standing at the sink with occlusion glasses donned. with glasses doffed pt was overshooting all targets - needed cues to problem solve through impiarment (to don glasses) Upper Body Bathing: Set up, Sitting Lower Body Bathing: Contact guard assist, Sit to/from stand Upper Body Dressing : Set up, Sitting Lower Body Dressing: Contact guard assist Toilet Transfer: Contact guard assist, Ambulation Toilet Transfer Details (indicate cue type and reason): Pt initally attempting to walk without occlusion glasses and without AD, pt extremely dizzy and unsteady. needed education on importance to use AD and don occlusion glasses with immediate improvement in dizziness Toileting- Clothing Manipulation and Hygiene: Supervision/safety, Sit to/from stand Functional mobility during ADLs: Contact guard assist, Rolling walker (2 wheels), Cueing for safety, Cueing for sequencing General ADL Comments: impaired insight, impulsive, problem solving, safety awareness and needs cues.    Mobility  Overal bed mobility: Needs Assistance Bed Mobility: Supine to Sit, Sit to Supine Supine to sit: Contact guard Sit to supine: Contact guard assist General bed mobility comments: cues due to impulsity, attempting to come to sitting with tray table across bed and lap    Transfers  Overall transfer level: Needs assistance Equipment used: Rollator (4 wheels) Transfers: Sit to/from Stand Sit to Stand: Min assist General transfer comment: light min A to steady on rise, from EOB and rollator seat x1    Ambulation / Gait / Stairs / Wheelchair Mobility   Ambulation/Gait Ambulation/Gait assistance: Editor, commissioning (Feet): 200 Feet (x2) Assistive device: Rollator (4 wheels) Gait Pattern/deviations: Step-through pattern, Decreased step length - right, Decreased step length - left, Drifts right/left, Trunk flexed General Gait Details: no instances of bumping into objects, veering R slightly hugging R side of hall, increased postural sway throughout Gait velocity: reduced Gait velocity interpretation: <1.31 ft/sec, indicative of household ambulator    Posture / Balance Dynamic Sitting Balance Sitting balance - Comments: Unable to reach to feet to donn socks when cued, supervision for static sitting balance EOB Static Standing Balance Rhomberg - Eyes Closed: 60 (able to maintain without LOB whil gesturing about visual distrurbances with eyes closed) Balance  Overall balance assessment: Needs assistance Sitting-balance support: No upper extremity supported, Feet supported Sitting balance-Leahy Scale: Fair Sitting balance - Comments: Unable to reach to feet to donn socks when cued, supervision for static sitting balance EOB Standing balance support: Single extremity supported, During functional activity Standing balance-Leahy Scale: Fair Standing balance comment: able to maintain static standing without UE support Rhomberg - Eyes Closed: 60 (able to maintain without LOB whil gesturing about visual distrurbances with eyes closed) High Level Balance Comments: able to accept some low level balance challenges, weaving around obstacles with increased cues to complete    Special needs/care consideration N/a   Previous Home Environment (from acute therapy documentation) Living Arrangements: Alone  Lives With: Alone Available Help at Discharge: Family, Available 24 hours/day Type of Home: House Home Layout: One level Home Access: Stairs to enter Secretary/administrator of Steps: 1 Bathroom Shower/Tub: Health visitor:  Standard Home Care Services: No Additional Comments: may need to confirm info due to aphasia  Discharge Living Setting Plans for Discharge Living Setting: Patient's home, Other (Comment) (family/friends to provide assist if needed) Type of Home at Discharge: House Discharge Home Layout: One level Discharge Home Access: Stairs to enter Entrance Stairs-Rails: None Entrance Stairs-Number of Steps: 1 Discharge Bathroom Shower/Tub: Walk-in shower Discharge Bathroom Toilet: Standard Discharge Bathroom Accessibility: Yes How Accessible: Accessible via walker Does the patient have any problems obtaining your medications?: No  Social/Family/Support Systems Anticipated Caregiver: rotating 24/7 with family/friends, son is primary contact Anticipated Caregiver's Contact Information: Sophia Muhs,  [phone] Ability/Limitations of Caregiver: none stated Caregiver Availability: 24/7 Discharge Plan Discussed with Primary Caregiver: Yes Is Caregiver In Agreement with Plan?: Yes Does Caregiver/Family have Issues with Lodging/Transportation while Pt is in Rehab?: No  Goals Patient/Family Goal for Rehab: PT/OT supervision to mod I, SLP mod I Expected length of stay: 10-14 days Additional Information: discharge plan: return to patient's home, family will rotate with friends to provide 24/7  if needed Pt/Family Agrees to Admission and willing to participate: Yes Program Orientation Provided & Reviewed with Pt/Caregiver Including Roles  & Responsibilities: Yes  Barriers to Discharge: Decreased caregiver support  Decrease burden of Care through IP rehab admission: n/a  Possible need for SNF placement upon discharge: Not anticipated.  Plan to return to pt's home at discharge.  Can have family/friends provide 24/7 (confirmed with son) if needed at discharge.   Patient Condition: I have reviewed medical records from [facility], spoken with  toc team , and patient, son, and daughter. I met with  patient at the bedside for inpatient rehabilitation assessment.  Patient will benefit from ongoing PT, OT, and SLP, can actively participate in 3 hours of therapy a day 5 days of the week, and can make measurable gains during the admission.  Patient will also benefit from the coordinated team approach during an Inpatient Acute Rehabilitation admission.  The patient will receive intensive therapy as well as Rehabilitation physician, nursing, social worker, and care management interventions.  Due to safety, skin/wound care, disease management, medication administration, pain management, and patient education the patient requires 24 hour a day rehabilitation nursing.  The patient is currently min assist with mobility and basic ADLs.  Discharge setting and therapy post discharge at home with home health is anticipated.  Patient has agreed to participate in the Acute Inpatient Rehabilitation Program and will admit today.  Preadmission Screen Completed By:  Freddie Apley, DPT 07/09/2023 9:01 AM ______________________________________________________________________   Discussed status with Dr. Shearon Stalls on 07/09/23  at 12:13 PM  and received approval for admission today.  Admission Coordinator:  Stephania Fragmin, PT, time 12:13 PM Dorna Bloom 07/09/23    Assessment/Plan: Diagnosis: Does the need for close, 24 hr/day Medical supervision in concert with the patient's rehab needs make it unreasonable for this patient to be served in a less intensive setting? Yes Co-Morbidities requiring supervision/potential complications: Aphasia/encephalopathy, concern for seizures, diplopia, visual hallucinations, facial fractures, poor pain control, hypertension, constipation and diastolic heart failure Due to safety, skin/wound care, disease management, medication administration, pain management, and patient education, does the patient require 24 hr/day rehab nursing? Yes Does the patient require coordinated care of a physician,  rehab nurse, PT, OT, and SLP to address physical and functional deficits in the context of the above medical diagnosis(es)? Yes Addressing deficits in the following areas: balance, endurance, locomotion, strength, transferring, bowel/bladder control, bathing, dressing, feeding, grooming, toileting, cognition, speech, language, swallowing, and psychosocial support Can the patient actively participate in an intensive therapy program of at least 3 hrs of therapy 5 days a week? Yes The potential for patient to make measurable gains while on inpatient rehab is good Anticipated functional outcomes upon discharge from inpatient rehab: supervision PT, supervision OT, modified independent SLP Estimated rehab length of stay to reach the above functional goals is: 10-14 days Anticipated discharge destination: Home 10. Overall Rehab/Functional Prognosis: good   MD Signature:  Angelina Sheriff, DO 07/09/2023

## 2023-07-09 NOTE — Progress Notes (Signed)
EEG complete - results pending 

## 2023-07-09 NOTE — Procedures (Signed)
Patient Name: Chloe Holmes  MRN: [id number]  Epilepsy Attending: Charlsie Quest  Referring Physician/Provider: Lynnae January, NP  Date: 07/09/2023 Duration: 24.28 mins  Patient history:  77 y.o. female presenting with fall and expressive greater than receptive aphasia. EEG to evaluate for seizure   Level of alertness: Awake  AEDs during EEG study: VPA  Technical aspects: This EEG study was done with scalp electrodes positioned according to the 10-20 International system of electrode placement. Electrical activity was reviewed with band pass filter of 1-70Hz , sensitivity of 7 uV/mm, display speed of 65mm/sec with a 60Hz  notched filter applied as appropriate. EEG data were recorded continuously and digitally stored.  Video monitoring was available and reviewed as appropriate.  Description: The posterior dominant rhythm consists of 8-9 Hz activity of moderate voltage (25-35 uV) seen predominantly in posterior head regions, symmetric and reactive to eye opening and eye closing. EEG showed intermittent 3 to 6 Hz theta-delta slowing in left temporal region. Hyperventilation and photic stimulation were not performed.      ABNORMALITY - Intermittent slow, left temporal region   IMPRESSION: This study is suggestive of cortical dysfunction arising from left temporal region. No seizures or epileptiform discharges were seen throughout the recording.   Chloe Holmes

## 2023-07-09 NOTE — Discharge Instructions (Addendum)
Inpatient Rehab Discharge Instructions  Lucretia Chatten Discharge date and time:  07/17/2023  Activities/Precautions/ Functional Status: Activity: no lifting, driving, or strenuous exercise until cleared by MD Diet: cardiac diet Wound Care: none needed Functional status:  ___ No restrictions     ___ Walk up steps independently ___ 24/7 supervision/assistance   ___ Walk up steps with assistance _x__ Intermittent supervision/assistance  ___ Bathe/dress independently ___ Walk with walker     ___ Bathe/dress with assistance ___ Walk Independently    ___ Shower independently ___ Walk with assistance    __x_ Shower with assistance __x_ No alcohol     ___ Return to work/school ________  Special Instructions: No driving, alcohol consumption or tobacco use.   COMMUNITY REFERRALS UPON DISCHARGE:    Outpatient: PT                 Agency: [facility] Phone: [phone]              Appointment Date/Time: Please allow 5-7 days for scheduling to reach out.     STROKE/TIA DISCHARGE INSTRUCTIONS SMOKING Cigarette smoking nearly doubles your risk of having a stroke & is the single most alterable risk factor  If you smoke or have smoked in the last 12 months, you are advised to quit smoking for your health. Most of the excess cardiovascular risk related to smoking disappears within a year of stopping. Ask you doctor about anti-smoking medications [place] Quit Line: 1-800-QUIT NOW Free Smoking Cessation Classes [phone]  CHOLESTEROL Know your levels; limit fat & cholesterol in your diet  Lipid Panel     Component Value Date/Time   CHOL 186 07/05/2023 0406   TRIG 75 07/05/2023 0406   HDL 77 07/05/2023 0406   CHOLHDL 2.4 07/05/2023 0406   VLDL 15 07/05/2023 0406   LDLCALC 94 07/05/2023 0406     Many patients benefit from treatment even if their cholesterol is at goal. Goal: Total Cholesterol (CHOL) less than 160 Goal:  Triglycerides (TRIG) less than 150 Goal:   HDL greater than 40 Goal:  LDL (LDLCALC) less than 100   BLOOD PRESSURE American Stroke Association blood pressure target is less that 120/80 mm/Hg  Your discharge blood pressure is:  BP: (!) 152/77 Monitor your blood pressure Limit your salt and alcohol intake Many individuals will require more than one medication for high blood pressure  DIABETES (A1c is a blood sugar average for last 3 months) Goal HGBA1c is under 7% (HBGA1c is blood sugar average for last 3 months)  Diabetes: No known diagnosis of diabetes    Lab Results  Component Value Date   HGBA1C 5.2 07/05/2023    Your HGBA1c can be lowered with medications, healthy diet, and exercise. Check your blood sugar as directed by your physician Call your physician if you experience unexplained or low blood sugars.  PHYSICAL ACTIVITY/REHABILITATION Goal is 30 minutes at least 4 days per week  Activity: Increase activity slowly, Therapies: Physical Therapy: Outpatient Return to work: when cleared by MD Activity decreases your risk of heart attack and stroke and makes your heart stronger.  It helps control your weight and blood pressure; helps you relax and can improve your mood. Participate in a regular exercise program. Talk with your doctor about the best form of exercise for you (dancing, walking, swimming, cycling).  DIET/WEIGHT Goal is to maintain a healthy weight  Your discharge diet is:  Diet Order  Diet Heart Room service appropriate? Yes with Assist; Fluid consistency: Thin  Diet effective now                  thin liquids Your height is:  Height: 5\' 5"  (165.1 cm) Your current weight is: Weight: 87.2 kg Your Body Mass Index (BMI) is:  BMI (Calculated): 31.99 Following the type of diet specifically designed for you will help prevent another stroke. Your goal weight range is:   Your goal Body Mass Index (BMI) is 19-24. Healthy food habits can help reduce 3 risk factors for stroke:  High cholesterol,  hypertension, and excess weight.  RESOURCES Stroke/Support Group:  Call [phone]   STROKE EDUCATION PROVIDED/REVIEWED AND GIVEN TO PATIENT Stroke warning signs and symptoms How to activate emergency medical system (call 911). Medications prescribed at discharge. Need for follow-up after discharge. Personal risk factors for stroke. Pneumonia vaccine given: No Flu vaccine given: No My questions have been answered, the writing is legible, and I understand these instructions.  I will adhere to these goals & educational materials that have been provided to me after my discharge from the hospital.      My questions have been answered and I understand these instructions. I will adhere to these goals and the provided educational materials after my discharge from the hospital.  Patient/Caregiver Signature _______________________________ Date __________  Clinician Signature _______________________________________ Date __________  Please bring this form and your medication list with you to all your follow-up doctor's appointments.

## 2023-07-09 NOTE — Progress Notes (Signed)
Angelina Sheriff, DO  Physician Physical Medicine and Rehabilitation   PMR Pre-admission    Signed   Date of Service: 07/09/2023  9:01 AM  Related encounter: ED to Hosp-Admission (Discharged) from 07/05/2023 in [facility] [place] [facility]   Signed     Expand All Collapse All  PMR Admission Coordinator Pre-Admission Assessment   Patient: Chloe Holmes is an 77 y.o., female MRN: [id number] DOB: 04/27/1946 Height: 5\' 5"  (165.1 cm) Weight: 93.6 kg   Insurance Information HMO:     PPO:      PCP:      IPA:      80/20:      OTHER:  PRIMARY: Medicare A/B      Policy#: [id number]       Subscriber: pt CM Name:       Phone#:      Fax#:  Pre-Cert#: verified Health and safety inspector:  Benefits:  Phone #:      Name:  Eff. Date: A/B 12/23/2009     Deduct: $1632      Out of Pocket Max: n/a      Life Max: n/a CIR: 100%      SNF: 20 full days Outpatient: 80%     Co-Pay: 20% Home Health: 100%      Co-Pay:  DME: 80%     Co-Pay: 20% Providers:  SECONDARY: BCBS Supplement      Policy#: [id number]     Phone#: [phone]   Financial Counselor:       Phone#:    The "Data Collection Information Summary" for patients in Inpatient Rehabilitation Facilities with attached "Privacy Act Statement-Health Care Records" was provided and verbally reviewed with: Patient and Family   Emergency Contact Information Contact Information       Name Relation Home Work [place] Son     [phone]         Other Contacts       Name Relation Home Work Mobile    James,Nicole Relative     [phone]    Lorre Munroe [phone]   [phone]    Particia Lather Daughter     [phone]           Current Medical History  Patient Admitting Diagnosis: CVA    History of Present Illness: Pt is a 77 y/o female with PMH of HTN admitted to [facility] on 11/10 following a fall at home with AMS/aphasia.  In ed, SBP 176 but otherwise hemodynamically stable.  Labs significant  for WBC 14.9, sodium 134, BUN 31, creatinine .88, INR 1.  CT head and face showed no acute cortical based infarct or signs of hemorrhage, but did reveal right orbital blowout fracture.  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  Admitted for workup.  Neurology recommended Keppra and MRI/EEG.  MRI initially negative and EEG consistent for dysfunction arising from the left temporal lobe.  Pt developed consistent visual disturbances and MRI was repeated, showed small L PCA infarct.  Changed keppra to depakote.  Also with Optho and ENT consults both exams limited by pt presentation and recommend f/u at a later date.  Therapy evaluations completed and pt was recommended for CIR.    Complete NIHSS TOTAL: 1   Patient's medical record from [facility] has been reviewed by the rehabilitation admission coordinator and physician.   Past Medical History      Past Medical History:  Diagnosis Date   Arthritis     Diverticulosis     Dyspnea      At pre-op , pt  (a retired Insurance underwriter) reports past Hx of SOB that she found out was being caused by frequent PVCs, she believes the PVCs were related to her poor tolerance of daily statin medication regimen ; however reports resolution of SOB now that she is only taking statin three times a week and taking bystolic to manage dysrhythmia.   GERD (gastroesophageal reflux disease)     History of hiatal hernia     HLD (hyperlipidemia)     Hypertension            Has the patient had major surgery during 100 days prior to admission? No   Family History   family history includes Cancer in her father and mother; Colon cancer in her mother; Heart disease in her father and mother; Hypertension in her father; Lung cancer in her father; Stomach cancer in her paternal grandfather.   Current Medications  Current Medications    Current Facility-Administered Medications:    acetaminophen (TYLENOL) tablet 650 mg, 650 mg, Oral,  Q4H PRN, 650 mg at 07/06/23 2114 **OR** acetaminophen (TYLENOL) 160 MG/5ML solution 650 mg, 650 mg, Per Tube, Q4H PRN **OR** acetaminophen (TYLENOL) suppository 650 mg, 650 mg, Rectal, Q4H PRN, Katrinka Blazing, Rondell A, MD, 650 mg at 07/05/23 1520   aspirin EC tablet 81 mg, 81 mg, Oral, Daily, Richardo Priest, Erin C, NP, 81 mg at 07/08/23 2037   butalbital-acetaminophen-caffeine (FIORICET) 50-325-40 MG per tablet 1 tablet, 1 tablet, Oral, Q6H PRN, Madelyn Flavors A, MD, 1 tablet at 07/07/23 6578   clopidogrel (PLAVIX) tablet 75 mg, 75 mg, Oral, Daily, Richardo Priest, Erin C, NP, 75 mg at 07/08/23 2036   divalproex (DEPAKOTE) DR tablet 500 mg, 500 mg, Oral, Q8H, Lehner, Erin C, NP, 500 mg at 07/09/23 0625   enoxaparin (LOVENOX) injection 40 mg, 40 mg, Subcutaneous, Q24H, Smith, Rondell A, MD, 40 mg at 07/08/23 2143   fentaNYL (SUBLIMAZE) injection 12.5 mcg, 12.5 mcg, Intravenous, Q2H PRN, Katrinka Blazing, Rondell A, MD   folic acid (FOLVITE) tablet 1 mg, 1 mg, Oral, Daily, Narda Bonds, MD, 1 mg at 07/08/23 0855   LORazepam (ATIVAN) tablet 1-4 mg, 1-4 mg, Oral, Q1H PRN, Narda Bonds, MD   multivitamin with minerals tablet 1 tablet, 1 tablet, Oral, Daily, Narda Bonds, MD, 1 tablet at 07/08/23 0855   senna-docusate (Senokot-S) tablet 1 tablet, 1 tablet, Oral, QHS PRN, Madelyn Flavors A, MD   thiamine (VITAMIN B1) tablet 100 mg, 100 mg, Oral, Daily, 100 mg at 07/08/23 0855 **OR** thiamine (VITAMIN B1) injection 100 mg, 100 mg, Intravenous, Daily, Nettey, Howell Pringle, MD   white petrolatum (VASELINE) gel, , Topical, PRN, Narda Bonds, MD     Patients Current Diet:  Diet Order                  Diet Heart Room service appropriate? Yes with Assist; Fluid consistency: Thin  Diet effective now                         Precautions / Restrictions Precautions Precautions: Fall Precaution Comments: diplopia Restrictions Weight Bearing Restrictions: No    Has the patient had 2 or more falls or a fall with injury in the past  year? Yes   Prior Activity Level Community (5-7x/wk): indep without device, driving, iADLs, retired   Prior Functional Level Self  Care: Did the patient need help bathing, dressing, using the toilet or eating? Independent   Indoor Mobility: Did the patient need assistance with walking from room to room (with or without device)? Independent   Stairs: Did the patient need assistance with internal or external stairs (with or without device)? Independent   Functional Cognition: Did the patient need help planning regular tasks such as shopping or remembering to take medications? Independent   Patient Information Are you of Hispanic, Latino/a,or Spanish origin?: A. No, not of Hispanic, Latino/a, or Spanish origin What is your race?: A. White Do you need or want an interpreter to communicate with a doctor or health care staff?: 0. No   Patient's Response To:  Health Literacy and Transportation Is the patient able to respond to health literacy and transportation needs?: Yes Health Literacy - How often do you need to have someone help you when you read instructions, pamphlets, or other written material from your doctor or pharmacy?: Never In the past 12 months, has lack of transportation kept you from medical appointments or from getting medications?: No In the past 12 months, has lack of transportation kept you from meetings, work, or from getting things needed for daily living?: No   Home Assistive Devices / Equipment   Prior Device Use: Indicate devices/aids used by the patient prior to current illness, exacerbation or injury? None of the above   Current Functional Level Cognition   Arousal/Alertness: Awake/alert Overall Cognitive Status: Impaired/Different from baseline Current Attention Level: Selective Orientation Level: Oriented X4 Following Commands: Follows one step commands consistently, Follows multi-step commands inconsistently Safety/Judgement: Decreased awareness of safety,  Decreased awareness of deficits General Comments: overall WFL for basic orientation impulsive throughout, limited insight to deficits and problem solving. Attention: Sustained Sustained Attention: Impaired Sustained Attention Impairment: Verbal basic Memory: Impaired Memory Impairment: Retrieval deficit Awareness: Appears intact Problem Solving: Impaired Problem Solving Impairment: Verbal basic    Extremity Assessment (includes Sensation/Coordination)   Upper Extremity Assessment: Overall WFL for tasks assessed  Lower Extremity Assessment: Defer to PT evaluation     ADLs   Overall ADL's : Needs assistance/impaired Eating/Feeding: Independent Grooming: Contact guard assist, Standing Grooming Details (indicate cue type and reason): standing at the sink with occlusion glasses donned. with glasses doffed pt was overshooting all targets - needed cues to problem solve through impiarment (to don glasses) Upper Body Bathing: Set up, Sitting Lower Body Bathing: Contact guard assist, Sit to/from stand Upper Body Dressing : Set up, Sitting Lower Body Dressing: Contact guard assist Toilet Transfer: Contact guard assist, Ambulation Toilet Transfer Details (indicate cue type and reason): Pt initally attempting to walk without occlusion glasses and without AD, pt extremely dizzy and unsteady. needed education on importance to use AD and don occlusion glasses with immediate improvement in dizziness Toileting- Clothing Manipulation and Hygiene: Supervision/safety, Sit to/from stand Functional mobility during ADLs: Contact guard assist, Rolling walker (2 wheels), Cueing for safety, Cueing for sequencing General ADL Comments: impaired insight, impulsive, problem solving, safety awareness and needs cues.     Mobility   Overal bed mobility: Needs Assistance Bed Mobility: Supine to Sit, Sit to Supine Supine to sit: Contact guard Sit to supine: Contact guard assist General bed mobility comments: cues  due to impulsity, attempting to come to sitting with tray table across bed and lap     Transfers   Overall transfer level: Needs assistance Equipment used: Rollator (4 wheels) Transfers: Sit to/from Stand Sit to Stand: Min assist General transfer comment: light  min A to steady on rise, from EOB and rollator seat x1     Ambulation / Gait / Stairs / Wheelchair Mobility   Ambulation/Gait Ambulation/Gait assistance: Editor, commissioning (Feet): 200 Feet (x2) Assistive device: Rollator (4 wheels) Gait Pattern/deviations: Step-through pattern, Decreased step length - right, Decreased step length - left, Drifts right/left, Trunk flexed General Gait Details: no instances of bumping into objects, veering R slightly hugging R side of hall, increased postural sway throughout Gait velocity: reduced Gait velocity interpretation: <1.31 ft/sec, indicative of household ambulator     Posture / Balance Dynamic Sitting Balance Sitting balance - Comments: Unable to reach to feet to donn socks when cued, supervision for static sitting balance EOB Static Standing Balance Rhomberg - Eyes Closed: 60 (able to maintain without LOB whil gesturing about visual distrurbances with eyes closed) Balance Overall balance assessment: Needs assistance Sitting-balance support: No upper extremity supported, Feet supported Sitting balance-Leahy Scale: Fair Sitting balance - Comments: Unable to reach to feet to donn socks when cued, supervision for static sitting balance EOB Standing balance support: Single extremity supported, During functional activity Standing balance-Leahy Scale: Fair Standing balance comment: able to maintain static standing without UE support Rhomberg - Eyes Closed: 60 (able to maintain without LOB whil gesturing about visual distrurbances with eyes closed) High Level Balance Comments: able to accept some low level balance challenges, weaving around obstacles with increased cues to complete      Special needs/care consideration N/a    Previous Home Environment (from acute therapy documentation) Living Arrangements: Alone  Lives With: Alone Available Help at Discharge: Family, Available 24 hours/day Type of Home: House Home Layout: One level Home Access: Stairs to enter Secretary/administrator of Steps: 1 Bathroom Shower/Tub: Health visitor: Standard Home Care Services: No Additional Comments: may need to confirm info due to aphasia   Discharge Living Setting Plans for Discharge Living Setting: Patient's home, Other (Comment) (family/friends to provide assist if needed) Type of Home at Discharge: House Discharge Home Layout: One level Discharge Home Access: Stairs to enter Entrance Stairs-Rails: None Entrance Stairs-Number of Steps: 1 Discharge Bathroom Shower/Tub: Walk-in shower Discharge Bathroom Toilet: Standard Discharge Bathroom Accessibility: Yes How Accessible: Accessible via walker Does the patient have any problems obtaining your medications?: No   Social/Family/Support Systems Anticipated Caregiver: rotating 24/7 with family/friends, son is primary contact Anticipated Caregiver's Contact Information: Samariyah Martins,  [phone] Ability/Limitations of Caregiver: none stated Caregiver Availability: 24/7 Discharge Plan Discussed with Primary Caregiver: Yes Is Caregiver In Agreement with Plan?: Yes Does Caregiver/Family have Issues with Lodging/Transportation while Pt is in Rehab?: No   Goals Patient/Family Goal for Rehab: PT/OT supervision to mod I, SLP mod I Expected length of stay: 10-14 days Additional Information: discharge plan: return to patient's home, family will rotate with friends to provide 24/7  if needed Pt/Family Agrees to Admission and willing to participate: Yes Program Orientation Provided & Reviewed with Pt/Caregiver Including Roles  & Responsibilities: Yes  Barriers to Discharge: Decreased caregiver support   Decrease  burden of Care through IP rehab admission: n/a   Possible need for SNF placement upon discharge: Not anticipated.  Plan to return to pt's home at discharge.  Can have family/friends provide 24/7 (confirmed with son) if needed at discharge.    Patient Condition: I have reviewed medical records from [facility], spoken with  toc team , and patient, son, and daughter. I met with patient at the bedside for inpatient rehabilitation assessment.  Patient will benefit from  ongoing PT, OT, and SLP, can actively participate in 3 hours of therapy a day 5 days of the week, and can make measurable gains during the admission.  Patient will also benefit from the coordinated team approach during an Inpatient Acute Rehabilitation admission.  The patient will receive intensive therapy as well as Rehabilitation physician, nursing, social worker, and care management interventions.  Due to safety, skin/wound care, disease management, medication administration, pain management, and patient education the patient requires 24 hour a day rehabilitation nursing.  The patient is currently min assist with mobility and basic ADLs.  Discharge setting and therapy post discharge at home with home health is anticipated.  Patient has agreed to participate in the Acute Inpatient Rehabilitation Program and will admit today.   Preadmission Screen Completed By:  Freddie Apley, DPT 07/09/2023 9:01 AM ______________________________________________________________________   Discussed status with Dr. Shearon Stalls on 07/09/23  at 12:13 PM  and received approval for admission today.   Admission Coordinator:  Stephania Fragmin, PT, time 12:13 PM Dorna Bloom 07/09/23     Assessment/Plan: Diagnosis: Does the need for close, 24 hr/day Medical supervision in concert with the patient's rehab needs make it unreasonable for this patient to be served in a less intensive setting? Yes Co-Morbidities requiring supervision/potential complications:  Aphasia/encephalopathy, concern for seizures, diplopia, visual hallucinations, facial fractures, poor pain control, hypertension, constipation and diastolic heart failure Due to safety, skin/wound care, disease management, medication administration, pain management, and patient education, does the patient require 24 hr/day rehab nursing? Yes Does the patient require coordinated care of a physician, rehab nurse, PT, OT, and SLP to address physical and functional deficits in the context of the above medical diagnosis(es)? Yes Addressing deficits in the following areas: balance, endurance, locomotion, strength, transferring, bowel/bladder control, bathing, dressing, feeding, grooming, toileting, cognition, speech, language, swallowing, and psychosocial support Can the patient actively participate in an intensive therapy program of at least 3 hrs of therapy 5 days a week? Yes The potential for patient to make measurable gains while on inpatient rehab is good Anticipated functional outcomes upon discharge from inpatient rehab: supervision PT, supervision OT, modified independent SLP Estimated rehab length of stay to reach the above functional goals is: 10-14 days Anticipated discharge destination: Home 10. Overall Rehab/Functional Prognosis: good     MD Signature:   Angelina Sheriff, DO 07/09/2023          Revision History

## 2023-07-09 NOTE — Progress Notes (Signed)
Patient ID: Chloe Holmes, female   DOB: Jul 31, 1946, 77 y.o.   MRN: [id number]  INPATIENT REHABILITATION ADMISSION NOTE   Arrival Method: chair     Mental Orientation: x4   Assessment: see flowsheet   Skin: CDI, 2 small abrasions to right great and 2nd toes.   IV'S: na   Pain: none reported   Tubes and Drains: na   Safety Measures: provided   Vital Signs: see flowsheet   Height and Weight: see flowsheet   Rehab Orientation: completed   Family: notified

## 2023-07-10 DIAGNOSIS — G40909 Epilepsy, unspecified, not intractable, without status epilepticus: Secondary | ICD-10-CM | POA: Diagnosis not present

## 2023-07-10 MED ORDER — MAGNESIUM GLUCONATE 500 MG PO TABS: 250.0000 mg | ORAL_TABLET | Freq: Every day | ORAL | Status: DC

## 2023-07-10 MED ORDER — NEBIVOLOL HCL 2.5 MG PO TABS
2.5000 mg | ORAL_TABLET | Freq: Every day | ORAL | Status: DC
  Administered 2023-07-10 – 2023-07-15 (×6): 2.5 mg via ORAL
  Filled 2023-07-10 (×6): qty 1

## 2023-07-10 MED ORDER — POTASSIUM CHLORIDE CRYS ER 10 MEQ PO TBCR
10.0000 meq | EXTENDED_RELEASE_TABLET | Freq: Once | ORAL | Status: AC
  Administered 2023-07-10: 10 meq via ORAL
  Filled 2023-07-10: qty 1

## 2023-07-10 MED ORDER — HYDRALAZINE HCL 25 MG PO TABS: 25.0000 mg | ORAL_TABLET | Freq: Three times a day (TID) | ORAL | Status: DC | PRN

## 2023-07-10 NOTE — Progress Notes (Signed)
Inpatient Rehabilitation Admission Medication Review by a Pharmacist  A complete drug regimen review was completed for this patient to identify any potential clinically significant medication issues.  High Risk Drug Classes Is patient taking? Indication by Medication  Antipsychotic No   Anticoagulant Yes Enoxaparin - VTE prophylaxis  Antibiotic No   Opioid No   Antiplatelet Yes Aspirin 81 mg and Clopidogrel for 3 weeks, then Aspirin alone - L PCA stenosis  Hypoglycemics/insulin No   Vasoactive Medication Yes Nebivolol daily, prn Hydralazine - hypertension  Chemotherapy No   Other Yes Atorvastatin - hyperlipidemia Divalproex - seizure prophylaxis Potassium chloride 10 mEq x 1 on 11/15 - supplement (K+ 3.9)  PRNs: Acetaminophen - mild pain Maalox - indigestion Fioricet - headache Guiafenesin/dextromethorphan - cough Linaclotide - constipation due to IBS Melatonin - sleep Methocarbamol - muscle spasms Ondansetron - nausea/vomiting Bisacodyl, Miralax, Fleets enema - constipation     Type of Medication Issue Identified Description of Issue Recommendation(s)  Drug Interaction(s) (clinically significant)     Duplicate Therapy     Allergy     No Medication Administration End Date  Clopidogrel stop time not yet in place.  Begun 07/08/23. Last dose of Clopidogrel should be on 07/28/23.  Incorrect Dose     Additional Drug Therapy Needed     Significant med changes from prior encounter (inform family/care partners about these prior to discharge). Nebivolol dose currently lower than PTA. Atorvastatin dose increased. New Clopidogrel, Divalproex.  Off hydrochlorothiazide and omeprazole. Celecoxib discontinued. Communicate changes with patient/family prior to discharge.   Resume prior hydrochlorothiazide and/or omeprazole if clinically indicated.  Other       Clinically significant medication issues were identified that warrant physician communication and completion of  prescribed/recommended actions by midnight of the next day:  No  Pharmacist comments:  - Clopidogrel planned for 3 weeks, stop date 07/28/23.  Time spent performing this drug regimen review (minutes):  20   Dennie Fetters, [place] 07/10/2023 11:50 AM

## 2023-07-10 NOTE — Plan of Care (Signed)
  Problem: Consults Goal: RH STROKE PATIENT EDUCATION Description: See Patient Education module for education specifics  07/10/2023 1246 by Elgie Congo, LPN Outcome: Progressing 07/10/2023 1217 by Tilden Dome A, LPN Outcome: Progressing   Problem: RH SAFETY Goal: RH STG ADHERE TO SAFETY PRECAUTIONS W/ASSISTANCE/DEVICE Description: STG Adhere to Safety Precautions With cues Assistance/Device. 07/10/2023 1246 by Elgie Congo, LPN Outcome: Progressing 07/10/2023 1217 by Tilden Dome A, LPN Outcome: Progressing   Problem: RH KNOWLEDGE DEFICIT Goal: RH STG INCREASE KNOWLEDGE OF HYPERTENSION Description: Patient will be able to manage HTN with medications and dietary modification using educational resources independently 07/10/2023 1246 by Elgie Congo, LPN Outcome: Progressing 07/10/2023 1217 by Tilden Dome A, LPN Outcome: Progressing Goal: RH STG INCREASE KNOWLEGDE OF HYPERLIPIDEMIA Description: Patient will be able to manage HLD with medications and dietary modification using educational resources independently 07/10/2023 1246 by Elgie Congo, LPN Outcome: Progressing 07/10/2023 1217 by Tilden Dome A, LPN Outcome: Progressing Goal: RH STG INCREASE KNOWLEDGE OF STROKE PROPHYLAXIS Description: Patient will be able to manage secondary risks with medications and dietary modification using educational resources independently 07/10/2023 1246 by Elgie Congo, LPN Outcome: Progressing 07/10/2023 1217 by Tilden Dome A, LPN Outcome: Progressing   Problem: RH Vision Goal: RH LTG Vision (Specify) Outcome: Progressing

## 2023-07-10 NOTE — Plan of Care (Signed)
  Problem: RH Balance Goal: LTG Patient will maintain dynamic standing with ADLs (OT) Description: LTG:  Patient will maintain dynamic standing balance with assist during activities of daily living (OT)  Flowsheets (Taken 07/10/2023 1236) LTG: Pt will maintain dynamic standing balance during ADLs with: Independent   Problem: Sit to Stand Goal: LTG:  Patient will perform sit to stand in prep for activites of daily living with assistance level (OT) Description: LTG:  Patient will perform sit to stand in prep for activites of daily living with assistance level (OT) Flowsheets (Taken 07/10/2023 1236) LTG: PT will perform sit to stand in prep for activites of daily living with assistance level: Independent   Problem: RH Bathing Goal: LTG Patient will bathe all body parts with assist levels (OT) Description: LTG: Patient will bathe all body parts with assist levels (OT) Flowsheets (Taken 07/10/2023 1236) LTG: Pt will perform bathing with assistance level/cueing: Independent with assistive device  LTG: Position pt will perform bathing: Shower   Problem: RH Dressing Goal: LTG Patient will perform upper body dressing (OT) Description: LTG Patient will perform upper body dressing with assist, with/without cues (OT). Flowsheets (Taken 07/10/2023 1236) LTG: Pt will perform upper body dressing with assistance level of: Independent Goal: LTG Patient will perform lower body dressing w/assist (OT) Description: LTG: Patient will perform lower body dressing with assist, with/without cues in positioning using equipment (OT) Flowsheets (Taken 07/10/2023 1236) LTG: Pt will perform lower body dressing with assistance level of: Independent with assistive device   Problem: RH Toileting Goal: LTG Patient will perform toileting task (3/3 steps) with assistance level (OT) Description: LTG: Patient will perform toileting task (3/3 steps) with assistance level (OT)  Flowsheets (Taken 07/10/2023 1236) LTG: Pt  will perform toileting task (3/3 steps) with assistance level: Independent with assistive device   Problem: RH Vision Goal: RH LTG Vision Consulting civil engineer) Flowsheets (Taken 07/10/2023 1236) LTG: Vision Goals: Pt will be proficient with ocularmotor exercises to improve R eye strength.   Problem: RH Simple Meal Prep Goal: LTG Patient will perform simple meal prep w/assist (OT) Description: LTG: Patient will perform simple meal prep with assistance, with/without cues (OT). Flowsheets (Taken 07/10/2023 1236) LTG: Pt will perform simple meal prep with assistance level of: Independent with assistive device   Problem: RH Laundry Goal: LTG Patient will perform laundry w/assist, cues (OT) Description: LTG: Patient will perform laundry with assistance, with/without cues (OT). Flowsheets (Taken 07/10/2023 1236) LTG: Pt will perform laundry with assistance level of: Independent with assistive device   Problem: RH Light Housekeeping Goal: LTG Patient will perform light housekeeping w/assist (OT) Description: LTG: Patient will perform light housekeeping with assistance, with/without cues (OT). Flowsheets (Taken 07/10/2023 1236) LTG: Pt will perform light housekeeping with assistance level of: Independent with assistive device   Problem: RH Toilet Transfers Goal: LTG Patient will perform toilet transfers w/assist (OT) Description: LTG: Patient will perform toilet transfers with assist, with/without cues using equipment (OT) Flowsheets (Taken 07/10/2023 1236) LTG: Pt will perform toilet transfers with assistance level of: Independent with assistive device   Problem: RH Tub/Shower Transfers Goal: LTG Patient will perform tub/shower transfers w/assist (OT) Description: LTG: Patient will perform tub/shower transfers with assist, with/without cues using equipment (OT) Flowsheets (Taken 07/10/2023 1236) LTG: Pt will perform tub/shower stall transfers with assistance level of: Independent with assistive  device LTG: Pt will perform tub/shower transfers from: Walk in shower

## 2023-07-10 NOTE — Progress Notes (Addendum)
Patient ID: Chloe Holmes, female   DOB: 10/03/1945, 77 y.o.   MRN: [id number]   Sw informed by covering physician-Dr. Carlis Abbott patient will be ready for d/c by her upcoming appointment on Wednesday 11/13. Sw also informed patient was seen by neurology and it has been confirmed that patient did have a seizure and recommended to continue Depakote.   2:34 PM: Sw informed patient. Patient informed PA informed her that diagnosis will remain. No additional questions or concerns.

## 2023-07-10 NOTE — Plan of Care (Signed)
  Problem: Consults Goal: RH STROKE PATIENT EDUCATION Description: See Patient Education module for education specifics  Outcome: Progressing   Problem: RH SAFETY Goal: RH STG ADHERE TO SAFETY PRECAUTIONS W/ASSISTANCE/DEVICE Description: STG Adhere to Safety Precautions With cues Assistance/Device. Outcome: Progressing   Problem: RH KNOWLEDGE DEFICIT Goal: RH STG INCREASE KNOWLEDGE OF HYPERTENSION Description: Patient will be able to manage HTN with medications and dietary modification using educational resources independently Outcome: Progressing Goal: RH STG INCREASE KNOWLEGDE OF HYPERLIPIDEMIA Description: Patient will be able to manage HLD with medications and dietary modification using educational resources independently Outcome: Progressing Goal: RH STG INCREASE KNOWLEDGE OF STROKE PROPHYLAXIS Description: Patient will be able to manage secondary risks with medications and dietary modification using educational resources independently Outcome: Progressing

## 2023-07-10 NOTE — Evaluation (Signed)
Speech Language Pathology Assessment and Plan  Patient Details  Name: Chloe Holmes MRN: [id number] Date of Birth: 1946-07-01  SLP Diagnosis: None Rehab Potential: Excellent ELOS: 7 days    Today's Date: 07/10/2023 SLP Individual Time: 1100-1200 SLP Individual Time Calculation (min): 60 min   Hospital Problem: Principal Problem:   Seizure disorder [facility])  Past Medical History:  Past Medical History:  Diagnosis Date   Arthritis    Diverticulosis    Dyspnea    At pre-op , Chloe Holmes  (Chloe Holmes) reports past Hx of SOB that she found out was being caused by frequent PVCs, she believes the PVCs were related to her poor tolerance of daily statin medication regimen ; however reports resolution of SOB now that she is only taking statin three times Chloe week and taking bystolic to manage dysrhythmia.   GERD (gastroesophageal reflux disease)    History of hiatal hernia    HLD (hyperlipidemia)    Hypertension    Past Surgical History:  Past Surgical History:  Procedure Laterality Date   ABDOMINAL HYSTERECTOMY     CERVICAL DISC SURGERY     COLONOSCOPY  2016   DILATION AND CURETTAGE OF UTERUS     ESOPHAGEAL MANOMETRY N/Chloe 10/03/2013   Procedure: ESOPHAGEAL MANOMETRY (EM);  Surgeon: Beverley Fiedler, MD;  Location: [facility];  Service: Gastroenterology;  Laterality: N/Chloe;   EYE SURGERY     bilateral cataracts    ORBITAL FRACTURE SURGERY     TOTAL KNEE ARTHROPLASTY Left 01/27/2019   Procedure: TOTAL KNEE ARTHROPLASTY;  Surgeon: Durene Romans, MD;  Location: [facility];  Service: Orthopedics;  Laterality: Left;  70 mins    Assessment / Plan / Recommendation Clinical Impression HPI: Chloe Holmes is Chloe 77 y/o female with PMH of HTN admitted to [facility] on 11/10 following Chloe fall at home with AMS/aphasia. CT head and face showed no acute cortical based infarct or signs of hemorrhage, but did reveal right orbital blowout fracture. 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 MRI initially negative and EEG consistent for dysfunction arising from the left temporal lobe. Chloe Holmes developed consistent visual disturbances and MRI was repeated, showed small L PCA infarct.   Clinical Impression:  Communication: WFL Receptive and Expressive language  Cognition: Patient scored WFL on all subtest's of the Cognistat with the exception of mild deficits in memory. SLP continued to evaluate memory through paragraph comprehension task. Patient answered comprehension questions with 100% accuracy independently immediately after paragraph and with ~8 minute delay. Patient ability to recall in depth medical history and life experiences indicating functional memory. Attention, awareness and problem solving WFL noted through standardized and non-standardized evaluation measures.  Dysarthria: 100% intelligible during conversation independently Patient does not need further SLP services due to [facility] cognitive functioning. Will sign off at this time. Please re-consult ST if change in status occurs.     Skilled Therapeutic Interventions          Patient evaluated using Chloe non-standardized cognitive linguistic assessment and bedside swallow evaluation to assess current cognitive, communicative and swallowing function. See above for details.    SLP Assessment  Patient does not need any further Speech Lanaguage Pathology Services    Recommendations  SLP Diet Recommendations: Age appropriate regular solids;Thin Medication Administration: Whole meds with liquid Patient destination: Home Follow up Recommendations: None Equipment Recommended: None recommended by SLP           Pain None reported  SLP Evaluation Cognition Overall Cognitive Status: Within Functional Limits for tasks assessed Arousal/Alertness: Awake/alert Orientation Level: Oriented X4 Year: 2024 Month: November Day of Week: Correct  Comprehension Auditory Comprehension Overall Auditory Comprehension:  Appears within functional limits for tasks assessed Expression Expression Primary Mode of Expression: Verbal Verbal Expression Overall Verbal Expression: Appears within functional limits for tasks assessed Written Expression Dominant Hand: Right Oral Motor Oral Motor/Sensory Function Overall Oral Motor/Sensory Function: Within functional limits Motor Speech Overall Motor Speech: Appears within functional limits for tasks assessed  Care Tool Care Tool Cognition Ability to hear (with hearing aid or hearing appliances if normally used Ability to hear (with hearing aid or hearing appliances if normally used): 0. Adequate - no difficulty in normal conservation, social interaction, listening to TV   Expression of Ideas and Wants Expression of Ideas and Wants: 4. Without difficulty (complex and basic) - expresses complex messages without difficulty and with speech that is clear and easy to understand   Understanding Verbal and Non-Verbal Content Understanding Verbal and Non-Verbal Content: 4. Understands (complex and basic) - clear comprehension without cues or repetitions  Memory/Recall Ability Memory/Recall Ability : Current season;That he or she is in Chloe hospital/hospital unit    Recommendations for other services: None   Discharge Criteria: Patient will be discharged from SLP if patient refuses treatment 3 consecutive times without medical reason, if treatment goals not met, if there is Chloe change in medical status, if patient makes no progress towards goals or if patient is discharged from hospital.  The above assessment, treatment plan, treatment alternatives and goals were discussed and mutually agreed upon: by patient  Ketina Mars M.Chloe., CF-SLP 07/10/2023, 12:19 PM

## 2023-07-10 NOTE — Progress Notes (Signed)
Patient ID: Chloe Holmes, female   DOB: [birth year], 77 y.o.   MRN: [id number] Met with the patient to review current situation, rehab schedule, team conference and plan of care. Patient nauseous from all the "vitamins"; requesting they be decreased or discontinued; along with liberalization of diet from [facility]. Statin intolerance in hx. just went up to 4/7 days a week on lipitor; concerned about sudden increase to 7/7 days and would like to see the ophthalmologist again, has questions about visual changes.  Patient noted she does not agree with dx. Of seizure although Dr. Otelia Limes notes seizure likely and will continue Depakote indefinitely. Wants to be sure she is able to get to her appointment for orbital fracture surgery on Wednesday Nov 20th at 3 pm. Reviewed DAPT. Glass taping for diplopia tolerated and phlebitis addressed. Continue to follow along to address educational needs to facilitate preparation for discharge. Pamelia Hoit

## 2023-07-10 NOTE — Progress Notes (Signed)
Inpatient Rehabilitation Care Coordinator Assessment and Plan Patient Details  Name: Chloe Holmes MRN: [id number] Date of Birth: 10/17/1945  Today's Date: 07/10/2023  Hospital Problems: Principal Problem:   Seizure disorder [facility])  Past Medical History:  Past Medical History:  Diagnosis Date   Arthritis    Diverticulosis    Dyspnea    At pre-op , pt  (a retired Insurance underwriter) reports past Hx of SOB that she found out was being caused by frequent PVCs, she believes the PVCs were related to her poor tolerance of daily statin medication regimen ; however reports resolution of SOB now that she is only taking statin three times a week and taking bystolic to manage dysrhythmia.   GERD (gastroesophageal reflux disease)    History of hiatal hernia    HLD (hyperlipidemia)    Hypertension    Past Surgical History:  Past Surgical History:  Procedure Laterality Date   ABDOMINAL HYSTERECTOMY     CERVICAL DISC SURGERY     COLONOSCOPY  2016   DILATION AND CURETTAGE OF UTERUS     ESOPHAGEAL MANOMETRY N/A 10/03/2013   Procedure: ESOPHAGEAL MANOMETRY (EM);  Surgeon: Beverley Fiedler, MD;  Location: [facility];  Service: Gastroenterology;  Laterality: N/A;   EYE SURGERY     bilateral cataracts    ORBITAL FRACTURE SURGERY     TOTAL KNEE ARTHROPLASTY Left 01/27/2019   Procedure: TOTAL KNEE ARTHROPLASTY;  Surgeon: Durene Romans, MD;  Location: [facility];  Service: Orthopedics;  Laterality: Left;  70 mins   Social History:  reports that she has never smoked. She has never used smokeless tobacco. She reports current alcohol use of about 7.0 - 10.0 standard drinks of alcohol per week. She reports that she does not use drugs.  Family / Support Systems Marital Status: Single Patient Roles: Parent Spouse/Significant Other: n/a Children: Son, Sheliah Hatch and [place], Daughter Other Supports: Joni Reining, Relative and Johnny Bridge, friend Anticipated Caregiver: Rotating 24/7 supervision from family/friends. Son is primary  contact. Ability/Limitations of Caregiver: None Caregiver Availability: 24/7 Family Dynamics: support from friends, family and children  Social History Preferred language: English Religion: Catholic Cultural Background: n/a Education: Some Naval architect - How often do you need to have someone help you when you read instructions, pamphlets, or other written material from your doctor or pharmacy?: Never Writes: Yes Employment Status: Retired Date Retired/Disabled/Unemployed: n/a Marine scientist Issues: n/a Guardian/Conservator: n/a   Abuse/Neglect Abuse/Neglect Assessment Can Be Completed: Yes Physical Abuse: Denies Verbal Abuse: Denies Sexual Abuse: Denies Exploitation of patient/patient's resources: Denies Self-Neglect: Denies  Patient response to: Social Isolation - How often do you feel lonely or isolated from those around you?: Never  Emotional Status Pt's affect, behavior and adjustment status: Pleasanr Recent Psychosocial Issues: Coping Psychiatric History: N/A Substance Abuse History: N/A  Patient / Family Perceptions, Expectations & Goals Pt/Family understanding of illness & functional limitations: yes Premorbid pt/family roles/activities: Independent without AD Anticipated changes in roles/activities/participation: Roating assistance and supervision from friends and family. Pt/family expectations/goals: Sup/MOD I  [employer]: None Premorbid Home Care/DME Agencies: None Transportation available at discharge: friends and family Is the patient able to respond to transportation needs?: Yes In the past 12 months, has lack of transportation kept you from medical appointments or from getting medications?: No In the past 12 months, has lack of transportation kept you from meetings, work, or from getting things needed for daily living?: No Resource referrals recommended: Neuropsychology  Discharge Planning Living  Arrangements: Alone Support Systems:  Friends/neighbors Type of Residence: Private residence (1 level home, 1 step.) Insurance Resources: Electrical engineer Screen Referred: No Living Expenses: Own Money Management: Patient Does the patient have any problems obtaining your medications?: No Home Management: Independent Patient/Family Preliminary Plans: Assistance from friends, family and children Care Coordinator Barriers to Discharge: Decreased caregiver support, Lack of/limited family support Care Coordinator Anticipated Follow Up Needs: HH/OP Expected length of stay: 10-14 Days  Clinical Impression Sw met with patient introduced self and explained role. Patient anticipates discharging home with support and supervision from friends and family. Son serves as Engineer, civil (consulting). Patient lives in a 1 level, 1 step entry home. Patient has expressed having an appointment on this upcoming Wednesday at 3 PM. Patient expressed she has shared this with the team and would like to ensure she is discharged before this upcoming appointment. SW will follow up with the team. No additional questions or concerns.   Andria Rhein 07/10/2023, 2:03 PM

## 2023-07-10 NOTE — Evaluation (Signed)
Occupational Therapy Assessment and Plan  Patient Details  Name: Chloe Holmes MRN: [id number] Date of Birth: [birth year]  OT Diagnosis: disturbance of vision and muscle weakness (generalized) Rehab Potential: Rehab Potential (ACUTE ONLY): Excellent ELOS: 5-7 days   Today's Date: 07/10/2023 OT Individual Time: 2423-5361 OT Individual Time Calculation (min): 80 min     Hospital Problem: Principal Problem:   Seizure disorder [facility])   Past Medical History:  Past Medical History:  Diagnosis Date   Arthritis    Diverticulosis    Dyspnea    At pre-op , pt  (a retired Insurance underwriter) reports past Hx of SOB that she found out was being caused by frequent PVCs, she believes the PVCs were related to her poor tolerance of daily statin medication regimen ; however reports resolution of SOB now that she is only taking statin three times a week and taking bystolic to manage dysrhythmia.   GERD (gastroesophageal reflux disease)    History of hiatal hernia    HLD (hyperlipidemia)    Hypertension    Past Surgical History:  Past Surgical History:  Procedure Laterality Date   ABDOMINAL HYSTERECTOMY     CERVICAL DISC SURGERY     COLONOSCOPY  2016   DILATION AND CURETTAGE OF UTERUS     ESOPHAGEAL MANOMETRY N/A 10/03/2013   Procedure: ESOPHAGEAL MANOMETRY (EM);  Surgeon: Beverley Fiedler, MD;  Location: [facility] END[facility]COPY;  Service: Gastroenterology;  Laterality: N/A;   EYE SURGERY     bilateral cataracts    ORBITAL FRACTURE SURGERY     TOTAL KNEE ARTHROPLASTY Left 01/27/2019   Procedure: TOTAL KNEE ARTHROPLASTY;  Surgeon: Durene Romans, MD;  Location: [facility] ORS;  Service: Orthopedics;  Laterality: Left;  70 mins    Assessment & Plan Clinical Impression: Chloe Holmes is a 77 year old female who presented to the emergency department on 07/05/2023 after a fall at home and hitting her head.  She remembers the fall stating she tripped over her feet while trying to retrieve her phone that she had dropped.  She was  able to call EMS via her smart watch and she was found wandering in her backyard.  Bruising was evident on her nose and right hand and she was presenting with word salad.  No witnesses.  She lives alone.  She was brought in as code stroke and initial MRI was negative for acute finding.  CT head was negative for acute large territory infarct or hypodensity however right orbital floor fracture was noted.  CTA head and neck without LVO.  cEEG performed.  LTM with continuous slow, left hemisphere, maximal left temporal region.  Due to these findings, neurology felt her event was likely due to a seizure. Keppra load was initiated. ENT and ophthalmology were consulted for right orbital floor fracture.  While history was limited due to the patient's aphasia, there were no signs of entrapment and no intervention. She developed visual hallucinations on 11-12 and repeat CT head was performed.  Keppra dose was increased and repeat EEG was performed 11/13.  She continued to endorse intermittent double vision and hallucinations of what appeared to be small blood vessels or branches on the wall appearing as wallpaper.  Repeat MRI with and without contrast obtained.  Keppra was discontinued due to possible side effect of visual hallucinations.  She was started on Depakote.  Aspirin and Plavix were started for 3 weeks then aspirin alone planned.  Lovenox started for DVT prophylaxis.  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  Hemoglobin A1c is 5.2%.  CBC and CMP within normal limits.  2D echo with estimated left ventricular ejection fraction of 55 to 60%.  Left ventricular diastolic parameters are consistent with grade 1 diastolic dysfunction.  She has no signs or symptoms of heart failure.  Heart healthy diet with thin liquids.  She is requiring min assist for safe transfers and gait with rollator.The patient requires inpatient medicine and rehabilitation evaluations and services for ongoing  dysfunction secondary to acute left parietal infarct.   Outpatient seizure precautions should be explained to the patient prior to discharge. Restrictions include no driving until seizure free for 6 months.        ROS: +Hallucinations. + R headache. Positives per HPI above. Denies fevers, chills, N/V, abdominal pain, SOB, chest pain, new weakness or paraesthesias.             Past Medical History:    .  Patient transferred to CIR on 07/09/2023 .    Patient currently requires supervision with basic self-care skills secondary to decreased coordination, decreased visual motor skills and diplopia, and decreased balance strategies.  Prior to hospitalization, patient was fully independent.   Patient will benefit from skilled intervention to increase independence with basic self-care skills and increase level of independence with iADL prior to discharge home independently.  Anticipate patient will require intermittent supervision and follow up outpatient. Pt will need transportation as she will not be able to drive for 6 months.   OT - End of Session Activity Tolerance: Tolerates 10 - 20 min activity with multiple rests Endurance Deficit: Yes Endurance Deficit Description: pt c/o nausea due to taking vitamins this am OT Assessment Rehab Potential (ACUTE ONLY): Excellent OT Patient demonstrates impairments in the following area(s): Balance;Endurance;Motor;Vision OT Basic ADL's Functional Problem(s): Grooming;Bathing;Dressing;Toileting OT Advanced ADL's Functional Problem(s): Simple Meal Preparation;Laundry;Light Housekeeping OT Transfers Functional Problem(s): Tub/Shower;Toilet OT Additional Impairment(s): None OT Plan OT Intensity: Minimum of 1-2 x/day, 45 to 90 minutes OT Frequency: 5-7 days a week OT Duration/Estimated Length of Stay: 5-7 days OT Treatment/Interventions: Social worker;Discharge planning;Patient/family education;Therapeutic  Exercise;Therapeutic Activities;UE/LE Coordination activities;Visual/perceptual remediation/compensation;Psychosocial support;Self Care/advanced ADL retraining;Neuromuscular re-education;Functional mobility training OT Self Feeding Anticipated Outcome(s): independent OT Basic Self-Care Anticipated Outcome(s): Mod Ind OT Toileting Anticipated Outcome(s): Mod Ind OT Bathroom Transfers Anticipated Outcome(s): Mod Ind OT Recommendation Patient destination: Home Follow Up Recommendations: Outpatient OT Equipment Recommended: None recommended by OT Equipment Details: pt has a built in Tour manager   OT Evaluation Precautions/Restrictions  Precautions Precautions: Fall Precaution Comments: diplopia Restrictions Weight Bearing Restrictions: No   Pain Pain Assessment Pain Score: 0-No pain Home Living/Prior Functioning Home Living Family/patient expects to be discharged to:: Private residence Living Arrangements: Alone Available Help at Discharge: Family, Available 24 hours/day Type of Home: House Home Access: Stairs to enter Secretary/administrator of Steps: 1 Home Layout: One level Bathroom Shower/Tub: Health visitor: Standard  Lives With: Alone Prior Function Level of Independence: Independent with basic ADLs, Independent with homemaking with ambulation, Independent with gait, Independent with transfers  Able to Take Stairs?: Yes Driving: Yes 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 Was there recently.  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 Vision Baseline Vision/History: 1 Wears glasses Ability to See in Adequate Light: 1 Impaired Patient Visual Report: Diplopia;Eye fatigue/eye pain/headache Vision Assessment?: Yes Eye Alignment: Impaired (comment) (misalignment of R eye) Ocular Range of Motion: Within Functional Limits (pt  able to move R eye in all ranges) Alignment/Gaze Preference: Within Defined  Limits Tracking/Visual Pursuits: Able to track stimulus in all quads without difficulty Saccades: Within functional limits Convergence: Impaired (comment) (unable to fully converge R eye at same time as L) Visual Fields: No apparent deficits Diplopia Assessment: Disappears with one eye closed Depth Perception:  [facility]) Additional Comments: adjusted R eye occlusion to not fully cover pupil.  On worth 4 dot test, pt demonstrated R eye suppression Perception  Perception: Within Functional Limits Praxis Praxis: WFL Cognition Cognition Overall Cognitive Status: Within Functional Limits for tasks assessed Arousal/Alertness: Awake/alert Orientation Level: Person;Place;Situation Person: Oriented Place: Oriented Situation: Oriented Brief Interview for Mental Status (BIMS) Repetition of Three Words (First Attempt): 3 Temporal Orientation: Year: Correct Temporal Orientation: Month: Accurate within 5 days Temporal Orientation: Day: Correct Recall: "Sock": Yes, no cue required Recall: "Blue": Yes, after cueing ("a color") Recall: "Bed": Yes, no cue required BIMS Summary Score: 14 Sensation Sensation Light Touch: Appears Intact Hot/Cold: Appears Intact Proprioception: Appears Intact Stereognosis: Appears Intact Coordination Gross Motor Movements are Fluid and Coordinated: Yes Fine Motor Movements are Fluid and Coordinated: Yes Finger Nose Finger Test: WFL Motor  Motor Motor - Skilled Clinical Observations: decreased LE coordination with dynamic balance (may be due to diplopia)  Trunk/Postural Assessment  Cervical Assessment Cervical Assessment: Within Functional Limits Thoracic Assessment Thoracic Assessment: Within Functional Limits Lumbar Assessment Lumbar Assessment: Within Functional Limits Postural Control Postural Control: Within Functional Limits  Balance Dynamic Sitting Balance Dynamic Sitting - Level of Assistance: 5: Stand by assistance Static Standing Balance Static  Standing - Level of Assistance: 5: Stand by assistance Dynamic Standing Balance Dynamic Standing - Level of Assistance: 4: Min assist Extremity/Trunk Assessment RUE Assessment RUE Assessment: Within Functional Limits LUE Assessment LUE Assessment: Within Functional Limits  Care Tool Care Tool Self Care Eating   Eating Assist Level: Independent    Oral Care    Oral Care Assist Level: Independent    Bathing   Body parts bathed by patient: Right arm;Left arm;Chest;Abdomen;Front perineal area;Buttocks;Right upper leg;Face;Left lower leg;Right lower leg;Left upper leg     Assist Level: Supervision/Verbal cueing    Upper Body Dressing(including orthotics)   What is the patient wearing?: Pull over shirt;Bra   Assist Level: Set up assist    Lower Body Dressing (excluding footwear)   What is the patient wearing?: Underwear/pull up;Pants Assist for lower body dressing: Supervision/Verbal cueing    Putting on/Taking off footwear   What is the patient wearing?: Non-skid slipper socks Assist for footwear: Supervision/Verbal cueing       Care Tool Toileting Toileting activity   Assist for toileting: Supervision/Verbal cueing     Care Tool Bed Mobility Roll left and right activity   Roll left and right assist level: Independent    Sit to lying activity   Sit to lying assist level: Independent    Lying to sitting on side of bed activity   Lying to sitting on side of bed assist level: the ability to move from lying on the back to sitting on the side of the bed with no back support.: Independent     Care Tool Transfers Sit to stand transfer   Sit to stand assist level: Supervision/Verbal cueing    Chair/bed transfer   Chair/bed transfer assist level: Supervision/Verbal cueing     Toilet transfer   Assist Level: Supervision/Verbal cueing     Care Tool Cognition  Expression of Ideas and Wants Expression of Ideas and Wants: 4.  Without difficulty (complex and basic) -  expresses complex messages without difficulty and with speech that is clear and easy to understand  Understanding Verbal and Non-Verbal Content Understanding Verbal and Non-Verbal Content: 4. Understands (complex and basic) - clear comprehension without cues or repetitions   Memory/Recall Ability Memory/Recall Ability : Current season;That he or she is in a hospital/hospital unit   Refer to Care Plan for Long Term Goals  SHORT TERM GOAL WEEK 1 OT Short Term Goal 1 (Week 1): STGs = LTGs  Recommendations for other services: Therapeutic Recreation  Outing/community reintegration   Skilled Therapeutic Intervention ADL ADL ADL Comments: pt is supervision with all self care and ADL transfers   Pt seen for initial evaluation and ADL training with a focus on complete self care without an AD.  Pt completed toileting, shower, dressing with supervision. She did need to keep the occlusion glasses on to avoid diplopia. Activity level limited by nausea today.  Pt worked on ocular motor exercises with pencil pushups, brock string, figure 8 visual tracking.   Reviewed role of OT, discussed POC and pt's goals, and ELOS. Pt resting in bed With all needs met and bed alarm set.      Discharge Criteria: Patient will be discharged from OT if patient refuses treatment 3 consecutive times without medical reason, if treatment goals not met, if there is a change in medical status, if patient makes no progress towards goals or if patient is discharged from hospital.  The above assessment, treatment plan, treatment alternatives and goals were discussed and mutually agreed upon: by patient  [facility] 07/10/2023, 12:56 PM

## 2023-07-10 NOTE — Progress Notes (Signed)
Inpatient Rehabilitation Center Individual Statement of Services  Patient Name:  Chloe Holmes  Date:  07/10/2023  Welcome to the Inpatient Rehabilitation Center.  Our goal is to provide you with an individualized program based on your diagnosis and situation, designed to meet your specific needs.  With this comprehensive rehabilitation program, you will be expected to participate in at least 3 hours of rehabilitation therapies Monday-Friday, with modified therapy programming on the weekends.  Your rehabilitation program will include the following services:  Physical Therapy (PT), Occupational Therapy (OT), Speech Therapy (ST), 24 hour per day rehabilitation nursing, Therapeutic Recreaction (TR), Neuropsychology, Care Coordinator, Rehabilitation Medicine, Nutrition Services, Pharmacy Services, and Other  Weekly team conferences will be held on Wednesdays to discuss your progress.  Your Inpatient Rehabilitation Care Coordinator will talk with you frequently to get your input and to update you on team discussions.  Team conferences with you and your family in attendance may also be held.  Expected length of stay: 10-14 Days  Overall anticipated outcome: supervision to mod   Depending on your progress and recovery, your program may change. Your Inpatient Rehabilitation Care Coordinator will coordinate services and will keep you informed of any changes. Your Inpatient Rehabilitation Care Coordinator's name and contact numbers are listed  below.  The following services may also be recommended but are not provided by the Inpatient Rehabilitation Center:   Home Health Rehabiltiation Services Outpatient Rehabilitation Services   Arrangements will be made to provide these services after discharge if needed.  Arrangements include referral to agencies that provide these services.  Your insurance has been verified to be:   Medicare A & B Your primary doctor is:  Creola Corn, MD  Pertinent  information will be shared with your doctor and your insurance company.  Inpatient Rehabilitation Care Coordinator:  Lavera Guise, [place] [phone] or [phone]  Information discussed with and copy given to patient by: Andria Rhein, 07/10/2023, 11:10 AM

## 2023-07-10 NOTE — Progress Notes (Signed)
PROGRESS NOTE   Subjective/Complaints: No new complaints this morning No seizures noted on yesterday's EEG, patient asks whether she had a seizure as she was told the slowing on initial EEG could be age related  ROS: +diplopia   Objective:   EEG adult  Result Date: 07/09/2023 Charlsie Quest, MD     07/09/2023  1:01 PM Patient Name: Chloe Holmes MRN: [id number] Epilepsy Attending: Charlsie Quest Referring Physician/Provider: Lynnae January, NP Date: 07/09/2023 Duration: 24.28 mins Patient history:  77 y.o. female presenting with fall and expressive greater than receptive aphasia. EEG to evaluate for seizure Level of alertness: Awake AEDs during EEG study: VPA Technical aspects: This EEG study was done with scalp electrodes positioned according to the 10-20 International system of electrode placement. Electrical activity was reviewed with band pass filter of 1-70Hz , sensitivity of 7 uV/mm, display speed of 81mm/sec with a 60Hz  notched filter applied as appropriate. EEG data were recorded continuously and digitally stored.  Video monitoring was available and reviewed as appropriate. Description: The posterior dominant rhythm consists of 8-9 Hz activity of moderate voltage (25-35 uV) seen predominantly in posterior head regions, symmetric and reactive to eye opening and eye closing. EEG showed intermittent 3 to 6 Hz theta-delta slowing in left temporal region. Hyperventilation and photic stimulation were not performed.    ABNORMALITY - Intermittent slow, left temporal region  IMPRESSION: This study is suggestive of cortical dysfunction arising from left temporal region. No seizures or epileptiform discharges were seen throughout the recording.  Charlsie Quest   ECHOCARDIOGRAM COMPLETE  Result Date: 07/09/2023    ECHOCARDIOGRAM REPORT   Patient Name:   [facility] Date of Exam: 07/09/2023 Medical Rec #:  [id number]            Height:       65.0 in Accession #:    [id number]          Weight:       206.3 lb Date of Birth:  1946-08-08           BSA:          2.005 m Patient Age:    77 years            BP:           180/76 mmHg Patient Gender: F                   HR:           67 bpm. Exam Location:  Inpatient Procedure: 2D Echo, Cardiac Doppler and Color Doppler Indications:    Stroke  History:        Patient has prior history of Echocardiogram examinations, most                 recent 05/15/2022. Risk Factors:Hypertension.  Sonographer:    Darlys Gales Referring Phys: [id number] Lynnae January IMPRESSIONS  1. Left ventricular ejection fraction, by estimation, is 55 to 60%. The left ventricle has normal function. The left ventricle has no regional wall motion abnormalities. There is mild left ventricular hypertrophy. Left ventricular diastolic parameters are consistent with Grade I diastolic dysfunction (impaired  relaxation).  2. Right ventricular systolic function is normal. The right ventricular size is normal. There is normal pulmonary artery systolic pressure.  3. Left atrial size was mildly dilated.  4. The mitral valve is normal in structure. Trivial mitral valve regurgitation. No evidence of mitral stenosis.  5. The aortic valve is tricuspid. Aortic valve regurgitation is not visualized. No aortic stenosis is present.  6. The inferior vena cava is normal in size with greater than 50% respiratory variability, suggesting right atrial pressure of 3 mmHg. FINDINGS  Left Ventricle: Left ventricular ejection fraction, by estimation, is 55 to 60%. The left ventricle has normal function. The left ventricle has no regional wall motion abnormalities. The left ventricular internal cavity size was normal in size. There is  mild left ventricular hypertrophy. Left ventricular diastolic parameters are consistent with Grade I diastolic dysfunction (impaired relaxation). Normal left ventricular filling pressure. Right Ventricle: The right ventricular size  is normal. No increase in right ventricular wall thickness. Right ventricular systolic function is normal. There is normal pulmonary artery systolic pressure. The tricuspid regurgitant velocity is 2.47 m/s, and  with an assumed right atrial pressure of 3 mmHg, the estimated right ventricular systolic pressure is 27.4 mmHg. Left Atrium: Left atrial size was mildly dilated. Right Atrium: Right atrial size was normal in size. Pericardium: There is no evidence of pericardial effusion. Mitral Valve: The mitral valve is normal in structure. Trivial mitral valve regurgitation. No evidence of mitral valve stenosis. Tricuspid Valve: The tricuspid valve is normal in structure. Tricuspid valve regurgitation is trivial. No evidence of tricuspid stenosis. Aortic Valve: The aortic valve is tricuspid. Aortic valve regurgitation is not visualized. No aortic stenosis is present. Aortic valve mean gradient measures 4.0 mmHg. Aortic valve peak gradient measures 7.4 mmHg. Aortic valve area, by VTI measures 1.72 cm. Pulmonic Valve: The pulmonic valve was normal in structure. Pulmonic valve regurgitation is not visualized. No evidence of pulmonic stenosis. Aorta: The aortic root is normal in size and structure. Venous: The inferior vena cava is normal in size with greater than 50% respiratory variability, suggesting right atrial pressure of 3 mmHg. IAS/Shunts: No atrial level shunt detected by color flow Doppler.  LEFT VENTRICLE PLAX 2D LVIDd:         3.60 cm   Diastology LVIDs:         2.60 cm   LV e' medial:    6.85 cm/s LV PW:         1.10 cm   LV E/e' medial:  9.6 LV IVS:        1.30 cm   LV e' lateral:   7.51 cm/s LVOT diam:     1.80 cm   LV E/e' lateral: 8.8 LV SV:         58 LV SV Index:   29 LVOT Area:     2.54 cm  RIGHT VENTRICLE RV S prime:     13.10 cm/s TAPSE (M-mode): 2.4 cm LEFT ATRIUM             Index        RIGHT ATRIUM           Index LA Vol (A2C):   62.8 ml 31.33 ml/m  RA Area:     19.70 cm LA Vol (A4C):   56.6 ml  28.24 ml/m  RA Volume:   53.70 ml  26.79 ml/m LA Biplane Vol: 62.1 ml 30.98 ml/m  AORTIC VALVE AV Area (Vmax):    1.84 cm AV  Area (Vmean):   1.79 cm AV Area (VTI):     1.72 cm AV Vmax:           136.00 cm/s AV Vmean:          103.000 cm/s AV VTI:            0.338 m AV Peak Grad:      7.4 mmHg AV Mean Grad:      4.0 mmHg LVOT Vmax:         98.20 cm/s LVOT Vmean:        72.500 cm/s LVOT VTI:          0.228 m LVOT/AV VTI ratio: 0.67  AORTA Ao Root diam: 2.70 cm Ao Asc diam:  3.60 cm MITRAL VALVE               TRICUSPID VALVE MV Area (PHT): 2.07 cm    TR Peak grad:   24.4 mmHg MV Decel Time: 366 msec    TR Vmax:        247.00 cm/s MV E velocity: 66.00 cm/s MV A velocity: 99.60 cm/s  SHUNTS MV E/A ratio:  0.66        Systemic VTI:  0.23 m                            Systemic Diam: 1.80 cm Chilton Si MD Electronically signed by Chilton Si MD Signature Date/Time: 07/09/2023/11:20:53 AM    Final    MR BRAIN W WO CONTRAST  Result Date: 07/08/2023 CLINICAL DATA:  Meningitis/CNS infection suspected. Visual hallucinations. Recent fall. Expressive aphasia. EXAM: MRI HEAD WITHOUT AND WITH CONTRAST TECHNIQUE: Multiplanar, multiecho pulse sequences of the brain and surrounding structures were obtained without and with intravenous contrast. CONTRAST:  10mL GADAVIST GADOBUTROL 1 MMOL/ML IV SOLN COMPARISON:  CT earlier same day.  MRI 3 days ago. FINDINGS: Brain: Chronic small-vessel ischemic changes affect pons. No focal cerebellar insult. Cerebral hemispheres show moderate chronic small-vessel ischemic changes of the white matter. Punctate acute infarction affecting the left parietal cortex. As an isolated finding, this could be incidental. No evidence of mass, hemorrhage, hydrocephalus or extra-axial collection. After contrast administration, no abnormal brain or leptomeningeal enhancement occurs. Vascular: Major vessels at the base of the brain show flow. Skull and upper cervical spine: Negative  Sinuses/Orbits: Sinuses are clear. Known orbital blowout fracture of the orbital floor on the right. Other: None IMPRESSION: 1. Punctate (1 mm) acute infarction affecting the left parietal cortex. As an isolated finding, this could be incidental. 2. Chronic small-vessel ischemic changes of the pons and cerebral hemispheric white matter. 3. Known orbital blowout fracture of the orbital floor on the right. 4. No imaging finding to suggest the diagnosis of meningitis. Electronically Signed   By: Paulina Fusi M.D.   On: 07/08/2023 14:26   Recent Labs    07/09/23 0817  WBC 5.5  HGB 13.2  HCT 39.8  PLT 201   Recent Labs    07/08/23 0438 07/09/23 0817  NA 138 138  K 3.9 3.9  CL 108 108  CO2 21* 23  GLUCOSE 98 99  BUN 20 20  CREATININE 0.91 0.74  CALCIUM 8.7* 8.9    Intake/Output Summary (Last 24 hours) at 07/10/2023 1043 Last data filed at 07/10/2023 0802 Gross per 24 hour  Intake 320 ml  Output --  Net 320 ml        Physical Exam: Vital Signs Blood pressure [name])  149/91, pulse 66, temperature 98.5 F (36.9 C), resp. rate 18, height 5\' 5"  (1.651 m), weight 87.2 kg, SpO2 98%. Gen: no distress, normal appearing HENT:  + R eye injection + R glasses taped. EOMI. PERRLA. Visual fields intact.  Cardiovascular: RRR, no murmurs/rub/gallops. No Edema. Peripheral pulses 2+  Respiratory: CTAB. No rales, rhonchi, or wheezing. On RA.  Abdomen: + bowel sounds, normoactive. No distention or tenderness.  GU: Not examined.   Skin: + R facial bruising + R AC erythema and raised area over prior IV; no warmth, fluctuance, or drainage + R 2nd toe wound - closed Psych: pleasant, normal affect MSK:      R second hammertoe       S/p L TKA       Neurologic exam:  Cognition: AAO to person, place, time and event.  Language: Fluent, No substitutions or neoglisms. No dysarthria. Names 3/3 objects correctly.  Memory: Recalls 3/3 objects at 5 minutes. No apparent deficits  Insight: Good  insight into  current condition.  Mood: Pleasant affect, appropriate mood.  Sensation: To light touch intact in BL UEs and LEs  Reflexes: 2+ in BL UE and LEs. Negative Hoffman's and babinski signs bilaterally.  CN: 2-12 grossly intact.  Coordination: No apparent tremors. No ataxia on FTN, HTS bilaterally.  Spasticity: MAS 0 in all extremities.  Strength:                RUE: 5/5 SA, 5/5 EF, 5/5 EE, 5/5 WE, 5/5 FF, 5/5 FA                 LUE: 5/5 SA, 5/5 EF, 5/5 EE, 5/5 WE, 5/5 FF, 5/5 FA                 RLE: 5/5 HF, 5/5 KE, 5/5 DF, 5/5 EHL, 5/5 PF                 LLE:  5/5 HF, 5/5 KE, 5/5 DF, 5/5 EHL, 5/5 PF    Assessment/Plan: 1. Functional deficits which require 3+ hours per day of interdisciplinary therapy in a comprehensive inpatient rehab setting. Physiatrist is providing close team supervision and 24 hour management of active medical problems listed below. Physiatrist and rehab team continue to assess barriers to discharge/monitor patient progress toward functional and medical goals  Care Tool:  Bathing              Bathing assist       Upper Body Dressing/Undressing Upper body dressing        Upper body assist      Lower Body Dressing/Undressing Lower body dressing            Lower body assist       Toileting Toileting    Toileting assist       Transfers Chair/bed transfer  Transfers assist           Locomotion Ambulation   Ambulation assist              Walk 10 feet activity   Assist           Walk 50 feet activity   Assist           Walk 150 feet activity   Assist           Walk 10 feet on uneven surface  activity   Assist           Wheelchair     Assist  Wheelchair 50 feet with 2 turns activity    Assist            Wheelchair 150 feet activity     Assist          Blood pressure (!) 149/91, pulse 66, temperature 98.5 F (36.9 C), resp. rate 18, height 5\' 5"  (1.651  m), weight 87.2 kg, SpO2 98%.    Medical Problem List and Plan: 1. 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  Antithrombotics: -DVT/anticoagulation:  Pharmaceutical: Lovenox             -antiplatelet therapy: Aspirin and Plavix for three weeks followed by aspirin alone   3. Pain Management: Tylenol, Fioricet as needed   4. Mood/Behavior/Sleep: LCSW to evaluate and provide emotional support             -antipsychotic agents: n/a   5. Neuropsych/cognition: This patient is capable of making decisions on her own behalf.   6. Skin/Wound Care: Routine skin care checks   7. Fluids/Electrolytes/Nutrition: Routine Is and Os and follow-up chemistries             -on heart healthy diet with thin liquids             -continue folic acid and MVI   8: New onset seizure:             -continue Depakote 500 mg every 8 hours             -Follow-up neurology as outpatient              - EEG 11/14 results pending   9: Hyperlipidemia: continue statin   10: Headache: Fioricet as needed   11: Visual disturbance/hallucinations: changed from Keppra to Depakote on 11/13 (repeat MRI 11/13 acute punctate infarction left parietal cortex)              - Ongoing 11/14              12: Orbital floor fracture OD (no entrapment): -Fredderick Phenix, MD (ophthalmology) and Nelva Nay. Elijah Birk, MD (ENT) consulted on 11/10.  Recommend to reconsult in 1 week per hospitalist, Dr. Lowell Guitar.   13: IBS/C: reordered Linzess  (30-60 minutes before breakfast); patient wants control of this med, discussed with nursing and pharmacy,  magnesium gluconate 250mg  ordered HS   14: History of hypertension: home Bystolic 10 mg and hydrochlorothiazide 20 mg currently held for permissive HTN              - Add PRN hydralazine 10 mg Q8H for Systolic >190, Diastolic >110  Restart Bistolic 2.5mg  daily  15. Seizure: clarified with neurology who feels patient had seizure and will need to remain on Depakote life long  16. Suboptimal potassim: kdur 10 ordered 11/15  LOS: 1 days A FACE TO FACE EVALUATION WAS PERFORMED  Drema Pry Itzael Liptak 07/10/2023, 10:43 AM

## 2023-07-10 NOTE — Evaluation (Signed)
Physical Therapy Assessment and Plan  Patient Details  Name: Chloe Holmes MRN: [id number] Date of Birth: 10/21/1945  PT Diagnosis: Coordination disorder, Difficulty walking, Dizziness and giddiness, and Muscle weakness Rehab Potential: Fair ELOS: 5-7 days   Today's Date: 07/10/2023 PT Individual Time: 2355-7322 PT Individual Time Calculation (min): 73 min    Hospital Problem: Principal Problem:   Seizure disorder [facility])   Past Medical History:  Past Medical History:  Diagnosis Date   Arthritis    Diverticulosis    Dyspnea    At pre-op , pt  (a retired Insurance underwriter) reports past Hx of SOB that she found out was being caused by frequent PVCs, she believes the PVCs were related to her poor tolerance of daily statin medication regimen ; however reports resolution of SOB now that she is only taking statin three times a week and taking bystolic to manage dysrhythmia.   GERD (gastroesophageal reflux disease)    History of hiatal hernia    HLD (hyperlipidemia)    Hypertension    Past Surgical History:  Past Surgical History:  Procedure Laterality Date   ABDOMINAL HYSTERECTOMY     CERVICAL DISC SURGERY     COLONOSCOPY  2016   DILATION AND CURETTAGE OF UTERUS     ESOPHAGEAL MANOMETRY N/A 10/03/2013   Procedure: ESOPHAGEAL MANOMETRY (EM);  Surgeon: Beverley Fiedler, MD;  Location: [facility] ENDOSCOPY;  Service: Gastroenterology;  Laterality: N/A;   EYE SURGERY     bilateral cataracts    ORBITAL FRACTURE SURGERY     TOTAL KNEE ARTHROPLASTY Left 01/27/2019   Procedure: TOTAL KNEE ARTHROPLASTY;  Surgeon: Durene Romans, MD;  Location: [facility] ORS;  Service: Orthopedics;  Laterality: Left;  70 mins    Assessment & Plan Clinical Impression: Patient is a 77 y.o. female who presented to the emergency department on 07/05/2023 after a fall at home and hitting her head. She remembers the fall stating she tripped over her feet while trying to retrieve her phone that she had dropped. She was able to call EMS  via her smart watch and she was found wandering in her backyard. Bruising was evident on her nose and right hand and she was presenting with word salad. No witnesses. She lives alone. She was brought in as code stroke and initial MRI was negative for acute finding. CT head was negative for acute large territory infarct or hypodensity however right orbital floor fracture was noted. CTA head and neck without LVO. cEEG performed. LTM with continuous slow, left hemisphere, maximal left temporal region. Due to these findings, neurology felt her event was likely due to a seizure. Keppra load was initiated. ENT and ophthalmology were consulted for right orbital floor fracture. While history was limited due to the patient's aphasia, there were no signs of entrapment and no intervention. She developed visual hallucinations on 11-12 and repeat CT head was performed. Keppra dose was increased and repeat EEG was performed 11/13. She continued to endorse intermittent double vision and hallucinations of what appeared to be small blood vessels or branches on the wall appearing as wallpaper. Repeat MRI with and without contrast obtained. Keppra was discontinued due to possible side effect of visual hallucinations. She was started on Depakote. Aspirin and Plavix were started for 3 weeks then aspirin alone planned. Lovenox started for DVT prophylaxis. 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 Hemoglobin A1c is 5.2%. CBC and CMP within normal limits. 2D echo with estimated left ventricular ejection fraction of 55  to 60%. Left ventricular diastolic parameters are consistent with grade 1 diastolic dysfunction. She has no signs or symptoms of heart failure. Heart healthy diet with thin liquids. She is requiring min assist for safe transfers and gait with rollator.The patient requires inpatient medicine and rehabilitation evaluations and services for ongoing dysfunction secondary to acute left parietal  infarct. Patient transferred to CIR on 07/09/2023.   Patient currently requires min assist with mobility secondary to muscle weakness, decreased cardiorespiratoy endurance, decreased visual acuity, and decreased standing balance and decreased balance strategies.  Prior to hospitalization, patient was independent  with mobility and lived with Alone in a House home.  Home access is 1Stairs to enter.  Patient will benefit from skilled PT intervention to maximize safe functional mobility, minimize fall risk, and decrease caregiver burden for planned discharge home with 24 hour assist.  Anticipate patient will benefit from follow up OP at discharge.  PT - End of Session Activity Tolerance: Tolerates 10 - 20 min activity with multiple rests Endurance Deficit: Yes PT Assessment Rehab Potential (ACUTE/IP ONLY): Fair PT Barriers to Discharge: Decreased caregiver support;Home environment access/layout;Lack of/limited family support PT Patient demonstrates impairments in the following area(s): Balance;Edema;Endurance;Motor;Nutrition;Pain;Safety PT Transfers Functional Problem(s): Bed to Chair;Car;Furniture PT Locomotion Functional Problem(s): Ambulation;Stairs PT Plan PT Intensity: Minimum of 1-2 x/day ,45 to 90 minutes PT Frequency: 5 out of 7 days PT Duration Estimated Length of Stay: 5-7 days PT Treatment/Interventions: Ambulation/gait training;Community reintegration;DME/adaptive equipment instruction;Neuromuscular re-education;Psychosocial support;Stair training;UE/LE Strength taining/ROM;UE/LE Coordination activities;Therapeutic Activities;Skin care/wound management;Pain management;Functional electrical stimulation;Discharge planning;Balance/vestibular training;Cognitive remediation/compensation;Disease management/prevention;Functional mobility training;Patient/family education;Splinting/orthotics;Therapeutic Exercise;Visual/perceptual remediation/compensation PT Transfers Anticipated Outcome(s):  supervision PT Locomotion Anticipated Outcome(s): CGA/ supervision PT Recommendation Recommendations for Other Services: None Follow Up Recommendations: Outpatient PT;24 hour supervision/assistance Patient destination: Home Equipment Recommended: To be determined   PT Evaluation Precautions/Restrictions Precautions Precautions: Fall Precaution Comments: diplopia, facial/ orbital fractures Restrictions Weight Bearing Restrictions: No General   Vital SignsTherapy Vitals Temp: 98.7 F (37.1 C) Pulse Rate: 70 Resp: 17 BP: (!) 167/96 Patient Position (if appropriate): Lying Oxygen Therapy SpO2: 97 % O2 Device: Room Air Pain Pain Assessment Pain Scale: 0-10 Pain Score: 0-No pain Pain Interference Pain Interference Pain Effect on Sleep: 1. Rarely or not at all Pain Interference with Therapy Activities: 1. Rarely or not at all Pain Interference with Day-to-Day Activities: 1. Rarely or not at all Home Living/Prior Functioning Home Living Living Arrangements: Alone Available Help at Discharge: Available 24 hours/day;Friend(s);Neighbor;Available PRN/intermittently Type of Home: House Home Access: Stairs to enter [employer] of Steps: 1 Entrance Stairs-Rails: None Home Layout: One level Bathroom Shower/Tub: Health visitor: Standard Bathroom Accessibility: Yes  Lives With: Alone Prior Function Level of Independence: Independent with basic ADLs;Independent with homemaking with ambulation;Independent with gait;Independent with transfers  Able to Take Stairs?: Yes Driving: Yes 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 Was there recently.  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 Vision/Perception  Vision - History Ability to See in Adequate Light: 1 Impaired Vision - Assessment Eye Alignment: Impaired (comment) (misalignment of R eye) Ocular Range of Motion: Within Functional Limits (pt  able to move R eye in all ranges) Alignment/Gaze Preference: Within Defined Limits Tracking/Visual Pursuits: Able to track stimulus in all quads without difficulty Saccades: Within functional limits Convergence: Impaired (comment) (unable to fully converge R eye at same time as L) Diplopia Assessment: Disappears with one eye closed Perception Perception: Within Functional Limits  Cognition Overall  Cognitive Status: Within Functional Limits for tasks assessed Arousal/Alertness: Awake/alert Orientation Level: Oriented X4 Attention: Focused Focused Attention: Appears intact Memory: Appears intact Awareness: Appears intact Problem Solving: Appears intact Safety/Judgment: Impaired Sensation Sensation Light Touch: Appears Intact Coordination Gross Motor Movements are Fluid and Coordinated: No Fine Motor Movements are Fluid and Coordinated: No Heel Shin Test: [facility] Motor  Motor Motor: Other (comment) Motor - Skilled Clinical Observations: decreased LE coordination with dynamic balance with slowed reaction timefor correction (maybe 2/2 diplopia)   Trunk/Postural Assessment  Cervical Assessment Cervical Assessment: Within Functional Limits Thoracic Assessment Thoracic Assessment: Within Functional Limits Lumbar Assessment Lumbar Assessment: Within Functional Limits Postural Control Postural Control: Within Functional Limits  Balance Standardized Balance Assessment Standardized Balance Assessment: Berg Balance Test Berg Balance Test Sit to Stand: Able to stand  independently using hands Standing Unsupported: Able to stand 2 minutes with supervision Sitting with Back Unsupported but Feet Supported on Floor or Stool: Able to sit 2 minutes under supervision Stand to Sit: Uses backs of legs against chair to control descent Transfers: Able to transfer with verbal cueing and /or supervision Standing Unsupported with Eyes Closed: Able to stand 10 seconds with supervision Standing  Ubsupported with Feet Together: Able to place feet together independently and stand for 1 minute with supervision From Standing, Reach Forward with Outstretched Arm: Can reach confidently >25 cm (10") From Standing Position, Pick up Object from Floor: Able to pick up shoe, needs supervision From Standing Position, Turn to Look Behind Over each Shoulder: Turn sideways only but maintains balance Turn 360 Degrees: Able to turn 360 degrees safely but slowly Standing Unsupported, Alternately Place Feet on Step/Stool: Able to complete 4 steps without aid or supervision Standing Unsupported, One Foot in Front: Able to plae foot ahead of the other independently and hold 30 seconds Standing on One Leg: Able to lift leg independently and hold equal to or more than 3 seconds Total Score: 37 Dynamic Sitting Balance Dynamic Sitting - Level of Assistance: 5: Stand by assistance Static Standing Balance Static Standing - Level of Assistance: 5: Stand by assistance;Other (comment) (CGA) Dynamic Standing Balance Dynamic Standing - Level of Assistance: 4: Min assist Extremity Assessment      RLE Assessment RLE Assessment: Within Functional Limits LLE Assessment LLE Assessment: Within Functional Limits  Care Tool Care Tool Bed Mobility Roll left and right activity   Roll left and right assist level: Independent    Sit to lying activity   Sit to lying assist level: Independent with assistive device    Lying to sitting on side of bed activity   Lying to sitting on side of bed assist level: the ability to move from lying on the back to sitting on the side of the bed with no back support.: Independent with assistive device     Care Tool Transfers Sit to stand transfer   Sit to stand assist level: Supervision/Verbal cueing    Chair/bed transfer   Chair/bed transfer assist level: Contact Guard/Touching assist    Car transfer   Car transfer assist level: Contact Guard/Touching assist      Care  Tool Locomotion Ambulation   Assist level: Minimal Assistance - Patient > 75% Assistive device: No Device Max distance: 150 ft  Walk 10 feet activity   Assist level: Supervision/Verbal cueing (reaching out for steady handholds) Assistive device: No Device   Walk 50 feet with 2 turns activity   Assist level: Contact Guard/Touching assist Assistive device: No Device  Walk 150 feet activity  Assist level: Minimal Assistance - Patient > 75% Assistive device: No Device  Walk 10 feet on uneven surfaces activity Walk 10 feet on uneven surfaces activity did not occur: Safety/medical concerns      Stairs   Assist level: Contact Guard/Touching assist Stairs assistive device: 2 hand rails Max number of stairs: 8  Walk up/down 1 step activity   Walk up/down 1 step (curb) assist level: Contact Guard/Touching assist Walk up/down 1 step or curb assistive device: Other (comment) (HHA)  Walk up/down 4 steps activity   Walk up/down 4 steps assist level: Contact Guard/Touching assist Walk up/down 4 steps assistive device: Other (comment) (HHA)  Walk up/down 12 steps activity Walk up/down 12 steps activity did not occur: Safety/medical concerns      Pick up small objects from floor   Pick up small object from the floor assist level: Contact Guard/Touching assist    Wheelchair Is the patient using a wheelchair?: No   Wheelchair activity did not occur: N/A      Wheel 50 feet with 2 turns activity Wheelchair 50 feet with 2 turns activity did not occur: N/A    Wheel 150 feet activity Wheelchair 150 feet activity did not occur: N/A      Refer to Care Plan for Long Term Goals  SHORT TERM GOAL WEEK 1 PT Short Term Goal 1 (Week 1): STG = LTG d/t ELOS  Recommendations for other services: None   Skilled Therapeutic Intervention Mobility Bed Mobility Bed Mobility: Rolling Right;Rolling Left;Supine to Sit;Sit to Supine Rolling Right: Independent Rolling Left: Independent Supine to Sit:  Supervision/Verbal cueing Sitting - Scoot to Edge of Bed: Supervision/Verbal cueing Sit to Supine: Supervision/Verbal cueing Transfers Transfers: Sit to Stand;Stand to Sit;Stand Pivot Transfers Sit to Stand: Contact Guard/Touching assist Stand to Sit: Contact Guard/Touching assist Stand Pivot Transfers: Contact Guard/Touching assist;Minimal Assistance - Patient > 75% Transfer (Assistive device): None Locomotion  Gait Ambulation: Yes Gait Assistance: Contact Guard/Touching assist;Minimal Assistance - Patient > 75% Gait Distance (Feet): 200 Feet Assistive device: None Gait Assistance Details: Verbal cues for precautions/safety;Other (comment) (vc for balance) Gait Gait: Yes Gait Pattern: Decreased step length - right;Decreased step length - left;Decreased stance time - right Gait velocity: reduced Stairs / Additional Locomotion Stairs: Yes Stairs Assistance: Contact Guard/Touching assist;Minimal Assistance - Patient > 75% Stair Management Technique: Two rails Number of Stairs: 8 Height of Stairs: 6 Wheelchair Mobility Wheelchair Mobility: No  Skilled Intervention: PT Evaluation completed; see above for results. PT educated patient in roles of PT vs OT, PT POC, rehab potential, rehab goals, and discharge recommendations along with recommendation for follow-up rehabilitation services. Individual treatment initiated:  Patient supine in bed upon PT arrival. Patient alert and agreeable to PT session. No pain complaint at start of session. Does c/o nausea that pt relates to morning consumption of vitamins on empty stomach. Pt also c/o diplopia that seems to be improving slowly with occlusion tape over medial aspect of R lens. At end of session, pt doffs glasses and relates improvement in vision that she hopes is not temporary.   Therapeutic Activity: Bed Mobility: Patient performed supine to/from sit with Mod I using bed rails/ edge of mattress. No cueing provided. Pt is able to sit on EOB  with no physical assist and hold balance statically though continues to c/o nausea. Is able to reach to bedside table for flip-flops to wear for ambulation d/t pain in L foot from fallen arch and painful pressure to bare floor. Dons with IND.  Transfers: Patient performed sit <> stand and stand pivot transfers with supervision/ CGA for balance upon stance. Reaches out for handholds when dynamically reaching outside of BOS. VC provided for conscious focus to required movements for reaching as well as to breathe throughout movement.   Gait Training:  Patient ambulated up to 150 feet using no AD with CGA/ MinA for balance. Pt reaches out consistently for steady hand hold of surface throughout distance when available. Ambulated with slower pace, significant path deviation of >8in to R>L. Provided vc/tc for level gaze and upright posture.   Neuromuscular Re-ed: NMR facilitated during session with focus on standing balance. Pt guided in berg balance testing within limits of vision/ nausea. Pt has greatest difficulty with tasks requiring NBOS, turning, reciprocating movement. Patient demonstrates increased fall risk as noted by score of  37/56 on Berg Balance Scale.  (<36= high risk for falls, close to 100%; 37-45 significant >80%; 46-51 moderate >50%; 52-55 lower >25%)  NMR performed for improvements in motor control and coordination, balance, sequencing, judgement, and self confidence/ efficacy in performing all aspects of mobility at highest level of independence.   Patient supine in bed at end of session with brakes locked, bed alarm set, and all needs within reach.    Discharge Criteria: Patient will be discharged from PT if patient refuses treatment 3 consecutive times without medical reason, if treatment goals not met, if there is a change in medical status, if patient makes no progress towards goals or if patient is discharged from hospital.  The above assessment, treatment plan, treatment  alternatives and goals were discussed and mutually agreed upon: by patient  Loel Dubonnet PT, DPT, CSRS 07/10/2023, 5:16 PM

## 2023-07-10 NOTE — Progress Notes (Signed)
Inpatient Rehabilitation  Patient information reviewed and entered into eRehab system by Oyuki Hogan M. Eri Mcevers, M.A., CCC/SLP, PPS Coordinator.  Information including medical coding, functional ability and quality indicators will be reviewed and updated through discharge.    

## 2023-07-11 DIAGNOSIS — H532 Diplopia: Secondary | ICD-10-CM

## 2023-07-11 DIAGNOSIS — G40909 Epilepsy, unspecified, not intractable, without status epilepticus: Secondary | ICD-10-CM | POA: Diagnosis not present

## 2023-07-11 DIAGNOSIS — I10 Essential (primary) hypertension: Secondary | ICD-10-CM

## 2023-07-11 NOTE — Progress Notes (Signed)
Physical Therapy Session Note  Patient Details  Name: Chloe Holmes MRN: [id number] Date of Birth: 10/03/1945  Today's Date: 07/11/2023 PT Individual Time: 1445-1530 PT Individual Time Calculation (min): 45 min   Short Term Goals: Week 1:  PT Short Term Goal 1 (Week 1): STG = LTG d/t ELOS  Skilled Therapeutic Interventions/Progress Updates: Pt presents supine in bed and agreeable to therapy.  Pt transfers sup to sit w/ mod I.  Pt sat EOB to doff slipper socks and don socks and shoes w/ set-up.  Pt transfers sit to stand w/ supervision 2/2 speed.  Pt amb to w/c.  Pt wheeled to outdoor for change in scenery as well as gait on uneven surfaces.  Pt amb x 120' w/ RW and close supervision to bench.  Pt amb short distances w/o AD and CGA, including ramped surface as well as obstacle course through car barriers.  Pt amb 150' w/o AD and CGA/min A w/o LOB.  Pt transfers from benches w/ supervision.  Pt negotiated through gift shop w/ directions from PT.  Pt amb into small gym up ramp and across mulch pit and down step w/ min/CGA.  Pt returned to room and doffed shoes and socks, donned slipper socks w/ supervision.  Pt returned to supine w/ mod I and remained in bed.  Bed alarm on and all needs in reach.     Therapy Documentation Precautions:  Precautions Precautions: Fall Precaution Comments: diplopia, facial/ orbital fractures Restrictions Weight Bearing Restrictions: No General:   Vital Signs: Therapy Vitals Temp: 97.7 F (36.5 C) Temp Source: Oral Pulse Rate: 70 Resp: 14 BP: 137/72 Patient Position (if appropriate): Lying Oxygen Therapy SpO2: 99 % O2 Device: Room Air Pain:0/10, although just got over HA.     Therapy/Group: Individual Therapy  [name] 07/11/2023, 4:02 PM

## 2023-07-11 NOTE — Progress Notes (Signed)
PROGRESS NOTE   Subjective/Complaints: Pt still with diplopia. Questions whether she had a seizure in the first place. thinks her flip flop became twisted up causing her to fall  ROS: Patient denies fever, rash, sore throat, blurred vision, dizziness, nausea, vomiting, diarrhea, cough, shortness of breath or chest pain,   headache, or mood change.    Objective:   EEG adult  Result Date: 07/09/2023 Charlsie Quest, MD     07/09/2023  1:01 PM Patient Name: Kyndra Eick MRN: [id number] Epilepsy Attending: Charlsie Quest Referring Physician/Provider: Lynnae January, NP Date: 07/09/2023 Duration: 24.28 mins Patient history:  77 y.o. female presenting with fall and expressive greater than receptive aphasia. EEG to evaluate for seizure Level of alertness: Awake AEDs during EEG study: VPA Technical aspects: This EEG study was done with scalp electrodes positioned according to the 10-20 International system of electrode placement. Electrical activity was reviewed with band pass filter of 1-70Hz , sensitivity of 7 uV/mm, display speed of 76mm/sec with a 60Hz  notched filter applied as appropriate. EEG data were recorded continuously and digitally stored.  Video monitoring was available and reviewed as appropriate. Description: The posterior dominant rhythm consists of 8-9 Hz activity of moderate voltage (25-35 uV) seen predominantly in posterior head regions, symmetric and reactive to eye opening and eye closing. EEG showed intermittent 3 to 6 Hz theta-delta slowing in left temporal region. Hyperventilation and photic stimulation were not performed.    ABNORMALITY - Intermittent slow, left temporal region  IMPRESSION: This study is suggestive of cortical dysfunction arising from left temporal region. No seizures or epileptiform discharges were seen throughout the recording.  Charlsie Quest   ECHOCARDIOGRAM COMPLETE  Result Date: 07/09/2023     ECHOCARDIOGRAM REPORT   Patient Name:   James P Thompson Md Pa Date of Exam: 07/09/2023 Medical Rec #:  [id number]           Height:       65.0 in Accession #:    [id number]          Weight:       206.3 lb Date of Birth:  [birth year]           BSA:          2.005 m Patient Age:    77 years            BP:           180/76 mmHg Patient Gender: F                   HR:           67 bpm. Exam Location:  Inpatient Procedure: 2D Echo, Cardiac Doppler and Color Doppler Indications:    Stroke  History:        Patient has prior history of Echocardiogram examinations, most                 recent 05/15/2022. Risk Factors:Hypertension.  Sonographer:    Darlys Gales Referring Phys: [id number] Lynnae January IMPRESSIONS  1. Left ventricular ejection fraction, by estimation, is 55 to 60%. The left ventricle has normal function. The left ventricle has no regional wall motion  abnormalities. There is mild left ventricular hypertrophy. Left ventricular diastolic parameters are consistent with Grade I diastolic dysfunction (impaired relaxation).  2. Right ventricular systolic function is normal. The right ventricular size is normal. There is normal pulmonary artery systolic pressure.  3. Left atrial size was mildly dilated.  4. The mitral valve is normal in structure. Trivial mitral valve regurgitation. No evidence of mitral stenosis.  5. The aortic valve is tricuspid. Aortic valve regurgitation is not visualized. No aortic stenosis is present.  6. The inferior vena cava is normal in size with greater than 50% respiratory variability, suggesting right atrial pressure of 3 mmHg. FINDINGS  Left Ventricle: Left ventricular ejection fraction, by estimation, is 55 to 60%. The left ventricle has normal function. The left ventricle has no regional wall motion abnormalities. The left ventricular internal cavity size was normal in size. There is  mild left ventricular hypertrophy. Left ventricular diastolic parameters are consistent with Grade I  diastolic dysfunction (impaired relaxation). Normal left ventricular filling pressure. Right Ventricle: The right ventricular size is normal. No increase in right ventricular wall thickness. Right ventricular systolic function is normal. There is normal pulmonary artery systolic pressure. The tricuspid regurgitant velocity is 2.47 m/s, and  with an assumed right atrial pressure of 3 mmHg, the estimated right ventricular systolic pressure is 27.4 mmHg. Left Atrium: Left atrial size was mildly dilated. Right Atrium: Right atrial size was normal in size. Pericardium: There is no evidence of pericardial effusion. Mitral Valve: The mitral valve is normal in structure. Trivial mitral valve regurgitation. No evidence of mitral valve stenosis. Tricuspid Valve: The tricuspid valve is normal in structure. Tricuspid valve regurgitation is trivial. No evidence of tricuspid stenosis. Aortic Valve: The aortic valve is tricuspid. Aortic valve regurgitation is not visualized. No aortic stenosis is present. Aortic valve mean gradient measures 4.0 mmHg. Aortic valve peak gradient measures 7.4 mmHg. Aortic valve area, by VTI measures 1.72 cm. Pulmonic Valve: The pulmonic valve was normal in structure. Pulmonic valve regurgitation is not visualized. No evidence of pulmonic stenosis. Aorta: The aortic root is normal in size and structure. Venous: The inferior vena cava is normal in size with greater than 50% respiratory variability, suggesting right atrial pressure of 3 mmHg. IAS/Shunts: No atrial level shunt detected by color flow Doppler.  LEFT VENTRICLE PLAX 2D LVIDd:         3.60 cm   Diastology LVIDs:         2.60 cm   LV e' medial:    6.85 cm/s LV PW:         1.10 cm   LV E/e' medial:  9.6 LV IVS:        1.30 cm   LV e' lateral:   7.51 cm/s LVOT diam:     1.80 cm   LV E/e' lateral: 8.8 LV SV:         58 LV SV Index:   29 LVOT Area:     2.54 cm  RIGHT VENTRICLE RV S prime:     13.10 cm/s TAPSE (M-mode): 2.4 cm LEFT ATRIUM              Index        RIGHT ATRIUM           Index LA Vol (A2C):   62.8 ml 31.33 ml/m  RA Area:     19.70 cm LA Vol (A4C):   56.6 ml 28.24 ml/m  RA Volume:   53.70 ml  26.79 ml/m  LA Biplane Vol: 62.1 ml 30.98 ml/m  AORTIC VALVE AV Area (Vmax):    1.84 cm AV Area (Vmean):   1.79 cm AV Area (VTI):     1.72 cm AV Vmax:           136.00 cm/s AV Vmean:          103.000 cm/s AV VTI:            0.338 m AV Peak Grad:      7.4 mmHg AV Mean Grad:      4.0 mmHg LVOT Vmax:         98.20 cm/s LVOT Vmean:        72.500 cm/s LVOT VTI:          0.228 m LVOT/AV VTI ratio: 0.67  AORTA Ao Root diam: 2.70 cm Ao Asc diam:  3.60 cm MITRAL VALVE               TRICUSPID VALVE MV Area (PHT): 2.07 cm    TR Peak grad:   24.4 mmHg MV Decel Time: 366 msec    TR Vmax:        247.00 cm/s MV E velocity: 66.00 cm/s MV A velocity: 99.60 cm/s  SHUNTS MV E/A ratio:  0.66        Systemic VTI:  0.23 m                            Systemic Diam: 1.80 cm Chilton Si MD Electronically signed by Chilton Si MD Signature Date/Time: 07/09/2023/11:20:53 AM    Final    Recent Labs    07/09/23 0817  WBC 5.5  HGB 13.2  HCT 39.8  PLT 201   Recent Labs    07/09/23 0817  NA 138  K 3.9  CL 108  CO2 23  GLUCOSE 99  BUN 20  CREATININE 0.74  CALCIUM 8.9    Intake/Output Summary (Last 24 hours) at 07/11/2023 0813 Last data filed at 07/11/2023 0731 Gross per 24 hour  Intake 460 ml  Output --  Net 460 ml        Physical Exam: Vital Signs Blood pressure (!) 140/87, pulse 69, temperature 98.2 F (36.8 C), resp. rate 16, height 5\' 5"  (1.651 m), weight 87.2 kg, SpO2 98%. Constitutional: No distress . Vital signs reviewed. HEENT: bruising right eye/face, EOMI, oral membranes moist Neck: supple Cardiovascular: RRR without murmur. No JVD    Respiratory/Chest: CTA Bilaterally without wheezes or rales. Normal effort    GI/Abdomen: BS +, non-tender, non-distended Ext: no clubbing, cyanosis, or edema Psych: pleasant and  cooperative  Skin: + R facial bruising as above + R AC erythema and raised area over prior IV; no warmth, fluctuance, or drainage + R 2nd toe wound -no change   MSK:      R second hammertoe       S/p L TKA       Neurologic exam:  Alert and oriented x 3. Normal insight and awareness. Intact Memory. Normal language and speech. Cranial nerve exam unremarkable. Sensation: To light touch intact in BL UEs and LEs  Reflexes: 2+ in BL UE and LEs.    Coordination: No apparent tremors. No ataxia on FTN, HTS bilaterally.  Spasticity: MAS 0 in all extremities.  Strength:                RUE: 5/5 SA, 5/5 EF, 5/5 EE, 5/5 WE, 5/5 FF, 5/5 FA  LUE: 5/5 SA, 5/5 EF, 5/5 EE, 5/5 WE, 5/5 FF, 5/5 FA                 RLE: 5/5 HF, 5/5 KE, 5/5 DF, 5/5 EHL, 5/5 PF                 LLE:  5/5 HF, 5/5 KE, 5/5 DF, 5/5 EHL, 5/5 PF --no change 11/16   Assessment/Plan: 1. Functional deficits which require 3+ hours per day of interdisciplinary therapy in a comprehensive inpatient rehab setting. Physiatrist is providing close team supervision and 24 hour management of active medical problems listed below. Physiatrist and rehab team continue to assess barriers to discharge/monitor patient progress toward functional and medical goals  Care Tool:  Bathing    Body parts bathed by patient: Right arm, Left arm, Chest, Abdomen, Front perineal area, Buttocks, Right upper leg, Face, Left lower leg, Right lower leg, Left upper leg         Bathing assist Assist Level: Supervision/Verbal cueing     Upper Body Dressing/Undressing Upper body dressing   What is the patient wearing?: Pull over shirt    Upper body assist Assist Level: Set up assist    Lower Body Dressing/Undressing Lower body dressing      What is the patient wearing?: Underwear/pull up, Pants     Lower body assist Assist for lower body dressing: Set up assist     Toileting Toileting    Toileting assist Assist for toileting:  Supervision/Verbal cueing     Transfers Chair/bed transfer  Transfers assist     Chair/bed transfer assist level: Supervision/Verbal cueing     Locomotion Ambulation   Ambulation assist      Assist level: Minimal Assistance - Patient > 75% Assistive device: No Device Max distance: 150 ft   Walk 10 feet activity   Assist     Assist level: Supervision/Verbal cueing (reaching out for steady handholds) Assistive device: No Device   Walk 50 feet activity   Assist    Assist level: Contact Guard/Touching assist Assistive device: No Device    Walk 150 feet activity   Assist    Assist level: Minimal Assistance - Patient > 75% Assistive device: No Device    Walk 10 feet on uneven surface  activity   Assist Walk 10 feet on uneven surfaces activity did not occur: Safety/medical concerns         Wheelchair     Assist Is the patient using a wheelchair?: No   Wheelchair activity did not occur: N/A         Wheelchair 50 feet with 2 turns activity    Assist    Wheelchair 50 feet with 2 turns activity did not occur: N/A       Wheelchair 150 feet activity     Assist  Wheelchair 150 feet activity did not occur: N/A       Blood pressure (!) 140/87, pulse 69, temperature 98.2 F (36.8 C), resp. rate 16, height 5\' 5"  (1.651 m), weight 87.2 kg, SpO2 98%.    Medical Problem List and Plan: 1. 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  Antithrombotics: -DVT/anticoagulation:  Pharmaceutical: Lovenox             -antiplatelet therapy: Aspirin and Plavix for three weeks followed  by aspirin alone   3. Pain Management: Tylenol, Fioricet as needed   4. Mood/Behavior/Sleep: LCSW to evaluate and provide emotional support             -antipsychotic agents: n/a   5. Neuropsych/cognition: This patient is capable of making decisions on her own behalf.   6. Skin/Wound Care: Routine skin care checks   7. Fluids/Electrolytes/Nutrition: Routine Is and Os and follow-up chemistries             -on heart healthy diet with thin liquids             -continue folic acid and MVI   8: New onset seizure:             -continue Depakote 500 mg every 8 hours             -Follow-up neurology as outpatient              - EEG 11/14 with left cortical dysfunction but otherwise unremarkable--spoke with pt regarding seizure treatment. Neurology feels she will need to be on prophylaxis life long   9: Hyperlipidemia: continue statin   10: Headache: Fioricet as needed   11: Visual disturbance/hallucinations: changed from Keppra to Depakote on 11/13 (repeat MRI 11/13 acute punctate infarction left parietal cortex)              - Ongoing 11/14              12: Orbital floor fracture OD (no entrapment): -Fredderick Phenix, MD (ophthalmology) and Nelva Nay. Elijah Birk, MD (ENT) consulted on 11/10.  Recommend to reconsult in 1 week per hospitalist, Dr. Lowell Guitar.   13: IBS/C: reordered Linzess  (30-60 minutes before breakfast); patient wants control of this med, discussed with nursing and pharmacy, magnesium gluconate 250mg  ordered HS   14: History of hypertension: home Bystolic 10 mg and hydrochlorothiazide 20 mg currently held for permissive HTN              - Add PRN hydralazine 10 mg Q8H for Systolic >190, Diastolic >110  Restarted Bistolic 2.5mg  daily on 11/15--borderline today--obsv before making further changes  16. Suboptimal potassim: kdur 10 ordered 11/15  LOS: 2 days A FACE TO FACE EVALUATION WAS PERFORMED  Ranelle Oyster 07/11/2023, 8:13 AM

## 2023-07-11 NOTE — Plan of Care (Signed)
  Problem: Consults Goal: RH STROKE PATIENT EDUCATION Description: See Patient Education module for education specifics  Outcome: Progressing   Problem: RH SAFETY Goal: RH STG ADHERE TO SAFETY PRECAUTIONS W/ASSISTANCE/DEVICE Description: STG Adhere to Safety Precautions With cues Assistance/Device. Outcome: Progressing   Problem: RH KNOWLEDGE DEFICIT Goal: RH STG INCREASE KNOWLEDGE OF HYPERTENSION Description: Patient will be able to manage HTN with medications and dietary modification using educational resources independently Outcome: Progressing Goal: RH STG INCREASE KNOWLEGDE OF HYPERLIPIDEMIA Description: Patient will be able to manage HLD with medications and dietary modification using educational resources independently Outcome: Progressing Goal: RH STG INCREASE KNOWLEDGE OF STROKE PROPHYLAXIS Description: Patient will be able to manage secondary risks with medications and dietary modification using educational resources independently Outcome: Progressing   Problem: RH Vision Goal: RH LTG Vision (Specify) Outcome: Progressing

## 2023-07-11 NOTE — Plan of Care (Signed)
  Problem: RH Balance Goal: LTG Patient will maintain dynamic standing balance (PT) Description: LTG:  Patient will maintain dynamic standing balance with assistance during mobility activities (PT) Flowsheets (Taken 07/11/2023 0652) LTG: Pt will maintain dynamic standing balance during mobility activities with:: Supervision/Verbal cueing   Problem: Sit to Stand Goal: LTG:  Patient will perform sit to stand with assistance level (PT) Description: LTG:  Patient will perform sit to stand with assistance level (PT) Flowsheets (Taken 07/11/2023 5956) LTG: PT will perform sit to stand in preparation for functional mobility with assistance level: Supervision/Verbal cueing   Problem: RH Bed Mobility Goal: LTG Patient will perform bed mobility with assist (PT) Description: LTG: Patient will perform bed mobility with assistance, with/without cues (PT). Flowsheets (Taken 07/11/2023 [phone]) LTG: Pt will perform bed mobility with assistance level of: Independent Note: With no complaints of dizziness   Problem: RH Bed to Chair Transfers Goal: LTG Patient will perform bed/chair transfers w/assist (PT) Description: LTG: Patient will perform bed to chair transfers with assistance (PT). Flowsheets (Taken 07/11/2023 [phone]) LTG: Pt will perform Bed to Chair Transfers with assistance level: Supervision/Verbal cueing   Problem: RH Car Transfers Goal: LTG Patient will perform car transfers with assist (PT) Description: LTG: Patient will perform car transfers with assistance (PT). Flowsheets (Taken 07/11/2023 [phone]) LTG: Pt will perform car transfers with assist:: Supervision/Verbal cueing   Problem: RH Furniture Transfers Goal: LTG Patient will perform furniture transfers w/assist (OT/PT) Description: LTG: Patient will perform furniture transfers  with assistance (OT/PT). Flowsheets (Taken 07/11/2023 [phone]) LTG: Pt will perform furniture transfers with assist:: Supervision/Verbal cueing   Problem: RH  Ambulation Goal: LTG Patient will ambulate in controlled environment (PT) Description: LTG: Patient will ambulate in a controlled environment, # of feet with assistance (PT). Flowsheets (Taken 07/11/2023 [phone]) LTG: Pt will ambulate in controlled environ  assist needed:: Supervision/Verbal cueing LTG: Ambulation distance in controlled environment: for more than 150 ft using LRAD Goal: LTG Patient will ambulate in home environment (PT) Description: LTG: Patient will ambulate in home environment, # of feet with assistance (PT). Flowsheets (Taken 07/11/2023 [phone]) LTG: Pt will ambulate in home environ  assist needed:: Supervision/Verbal cueing LTG: Ambulation distance in home environment: up to 50 ft bouts using LRAD   Problem: RH Stairs Goal: LTG Patient will ambulate up and down stairs w/assist (PT) Description: LTG: Patient will ambulate up and down # of stairs with assistance (PT) Flowsheets (Taken 07/11/2023 0652) LTG: Pt will ambulate up/down stairs assist needed:: Supervision/Verbal cueing LTG: Pt will  ambulate up and down number of stairs: at least one step with no HR as per home environment

## 2023-07-11 NOTE — Progress Notes (Signed)
Physical Therapy Session Note  Patient Details  Name: Chloe Holmes MRN: [id number] Date of Birth: [birth year]  Today's Date: 07/11/2023 PT Individual Time: 1307-1420 PT Individual Time Calculation (min): 73 min   Short Term Goals: Week 1:  PT Short Term Goal 1 (Week 1): STG = LTG d/t ELOS  Skilled Therapeutic Interventions/Progress Updates: Patient supine in bed on entrance to room. Patient alert and agreeable to PT session.   Patient reported slight nausea (no reports of vomiting) and a light headache (RN provided tylenol).  During building pt rapport at beginning of session, pt reported L SI pain (existing prior) where she had dry needling performed due to knots, and cortizone shot. PTA palpated and found some knots with pt educated on trigger point release and agreeable to have that done. Pt main goal this session is to work on strengthening B LE's.   Therapeutic Activity: Bed Mobility: Pt performed supine<>sit on EOB with modI. No VC required.  Transfers: Pt performed sit<>stand transfers throughout session with no AD. No VC required.   Gait Training:  Pt ambulated from room<>main gym (roughly 90') using no AD with CGA. Pt demonstrated the following gait deviations with therapist providing the described cuing and facilitation for improvement:  - mild deviations to R and L with pt occasionally furniture surfing with VC to ambulate in middle of hallway to avoid - decreased B step clearance and reciprocal arm swing (no VC for these this session as main focus was to avoid furniture surfing with the little ambulation did at beginning and end of session).  Therapeutic Exercise: Pt performed the following exercises with therapist providing the described cuing and facilitation for improvement. - Standing hip extension on L LE (following trigger point release) 2 x 10 with VC for pt to avoid forward flexion and to increase retro step length (theraband tied to pole on stairs) - pt prone  on hi/low mat performing hip extension (bilaterally) with knees in flexion to bias glutes 2 x 10 - LAQ with 8lb ankle weight sitting edge of mat 3 x 10 - Squats from hi/low mat (lowered) with 10 lbs (2 weighted bars cobanned together) with pt performing 20 reps before needing a rest break. Pt provided with VC to avoid lifting feet off ground when sitting, and controlling descent. Pt performed 2 rounds until close to fatigue.   Manual Therapy: - trigger point release to L glute/piriformis. Pt noted to have several knots that were released during this intervention with pt reporting that L LE felt like it wasn't going to "collapse" as when previously walking during therapy.   Patient supine in bed with RN present at end of session with brakes locked, bed alarm set, and all needs within reach.      Therapy Documentation Precautions:  Precautions Precautions: Fall Precaution Comments: diplopia, facial/ orbital fractures Restrictions Weight Bearing Restrictions: No  Therapy/Group: Individual Therapy  Carolie Mcilrath PTA 07/11/2023, 3:25 PM

## 2023-07-11 NOTE — Progress Notes (Signed)
Occupational Therapy Session Note  Patient Details  Name: Chloe Holmes MRN: [id number] Date of Birth: 19-Jan-1946  Today's Date: 07/11/2023 OT Individual Time: 3244-0102 OT Individual Time Calculation (min): 71 min    Short Term Goals: Week 1:  OT Short Term Goal 1 (Week 1): STGs = LTGs  Skilled Therapeutic Interventions/Progress Updates:  Pt greeted supine in bed, pt agreeable to OT intervention.  Pt nauseous during session, asked nurse for zofran.     Transfers/bed mobility: pt completed supine>sit with supervision, ambulatory transfer to bathroom and sink with no AD and supervision. Pt completed ambualtory transfer to gym with CGA, pt noted to furniture walk d/t unsteadiness on feet from visual deficits.   ADLs:  Grooming: hand hygiene at sink with supervision.  UB dressing:donned [place] shirt from sitting on toilet with set- up assist  LB dressing: donned pants and underwear with set- up assist Footwear: donned socks MODI from EOB  Toileting: pt with continent urine void able to complete 3/3 toileting tasks with supervision.    Exercises: pt completed below visual exercises to increase R eye strength and improve diplopia:  - utilized brock string for gaze stabilization and visual tracking, pt continues to endorse double vision with images that are farther away -worked on figure 8, noted mild nystagmus when tracking to L side - pt completed "pencil push ups" for R eye strengthening -utilized BITS to challenge visual pursuits with pt sequencing through numbers 1-15 while numbers were rotating clockwise, 93% accuracy, 1.57 sec reaction time.  -utilized BITS for visual tracking with pt reaching out of BOS to tap stimulus on screen with an emphasis on gaze stabilization, 100% accuracy, 1.02 sec reaction time  - pt completed visual tracking exercise on bits to visually track letters A-Z, 89% accuracy, 2.23 sec reaction time   Pt requested to spend some time on the Nustep to  facilitate improved strength and global endurance, pt has nustep at home. Pt completed 9 mins on nustep at level 5 workload.    Pt seems to have increased double vision from far away but if pt focuses on images the double image does resolve            Ended session with pt supine  in bed with all needs within reach and bed alarm activated.                    Therapy Documentation Precautions:  Precautions Precautions: Fall Precaution Comments: diplopia, facial/ orbital fractures Restrictions Weight Bearing Restrictions: No  Pain: Unrated pain reported from nausea, meds and rest breaks provided as needed.    Therapy/Group: Individual Therapy  Milan Carpinelli [facility] 07/11/2023, 10:55 AM

## 2023-07-12 DIAGNOSIS — I10 Essential (primary) hypertension: Secondary | ICD-10-CM | POA: Diagnosis not present

## 2023-07-12 DIAGNOSIS — H532 Diplopia: Secondary | ICD-10-CM | POA: Diagnosis not present

## 2023-07-12 DIAGNOSIS — G40909 Epilepsy, unspecified, not intractable, without status epilepticus: Secondary | ICD-10-CM | POA: Diagnosis not present

## 2023-07-12 NOTE — Plan of Care (Signed)
  Problem: RH SAFETY Goal: RH STG ADHERE TO SAFETY PRECAUTIONS W/ASSISTANCE/DEVICE Description: STG Adhere to Safety Precautions With cues Assistance/Device. Outcome: Progressing   Problem: RH KNOWLEDGE DEFICIT Goal: RH STG INCREASE KNOWLEDGE OF HYPERTENSION Description: Patient will be able to manage HTN with medications and dietary modification using educational resources independently Outcome: Progressing

## 2023-07-12 NOTE — Plan of Care (Signed)
  Problem: Consults Goal: RH STROKE PATIENT EDUCATION Description: See Patient Education module for education specifics  Outcome: Progressing   Problem: RH SAFETY Goal: RH STG ADHERE TO SAFETY PRECAUTIONS W/ASSISTANCE/DEVICE Description: STG Adhere to Safety Precautions With cues Assistance/Device. Outcome: Progressing   Problem: RH KNOWLEDGE DEFICIT Goal: RH STG INCREASE KNOWLEDGE OF HYPERTENSION Description: Patient will be able to manage HTN with medications and dietary modification using educational resources independently Outcome: Progressing Goal: RH STG INCREASE KNOWLEGDE OF HYPERLIPIDEMIA Description: Patient will be able to manage HLD with medications and dietary modification using educational resources independently Outcome: Progressing Goal: RH STG INCREASE KNOWLEDGE OF STROKE PROPHYLAXIS Description: Patient will be able to manage secondary risks with medications and dietary modification using educational resources independently Outcome: Progressing

## 2023-07-12 NOTE — Progress Notes (Signed)
PROGRESS NOTE   Subjective/Complaints: Pt having persistent  nausea with depakote. Zofran usually helps. Pt would like to talk to neurology about her seizures. Intermittent diplopia  ROS: Patient denies fever, rash, sore throat, blurred vision, dizziness, vomiting, diarrhea, cough, shortness of breath or chest pain, joint or back/neck pain, headache, or mood change.    Objective:   No results found. Recent Labs    07/09/23 0817  WBC 5.5  HGB 13.2  HCT 39.8  PLT 201   Recent Labs    07/09/23 0817  NA 138  K 3.9  CL 108  CO2 23  GLUCOSE 99  BUN 20  CREATININE 0.74  CALCIUM 8.9    Intake/Output Summary (Last 24 hours) at 07/12/2023 0747 Last data filed at 07/12/2023 0711 Gross per 24 hour  Intake 697 ml  Output --  Net 697 ml        Physical Exam: Vital Signs Blood pressure 131/78, pulse 65, temperature 98 F (36.7 C), temperature source Oral, resp. rate 18, height 5\' 5"  (1.651 m), weight 87.2 kg, SpO2 98%. Constitutional: No distress . Vital signs reviewed. HEENT: RIght facial, periocular bruising, EOMI, oral membranes moist Neck: supple Cardiovascular: RRR without murmur. No JVD    Respiratory/Chest: CTA Bilaterally without wheezes or rales. Normal effort    GI/Abdomen: BS +, non-tender, non-distended Ext: no clubbing, cyanosis, or edema Psych: pleasant and cooperative  Skin: + R facial bruising as above + R AC erythema and raised area over prior IV; no warmth, fluctuance, or drainage + R 2nd toe wound -no changes 11/17   MSK:      R second hammertoe       S/p L TKA       Neurologic exam:  Alert and oriented x 3. Normal insight and awareness. Intact Memory. Normal language and speech. Cranial nerve exam unremarkable. Sensation: To light touch intact in BL UEs and LEs  Reflexes: 2+ in BL UE and LEs.    Coordination: No apparent tremors. No ataxia on FTN, HTS bilaterally.  Spasticity: MAS 0 in  all extremities.  Strength:                RUE: 5/5 SA, 5/5 EF, 5/5 EE, 5/5 WE, 5/5 FF, 5/5 FA                 LUE: 5/5 SA, 5/5 EF, 5/5 EE, 5/5 WE, 5/5 FF, 5/5 FA                 RLE: 5/5 HF, 5/5 KE, 5/5 DF, 5/5 EHL, 5/5 PF                 LLE:  5/5 HF, 5/5 KE, 5/5 DF, 5/5 EHL, 5/5 PF --no change 11/17   Assessment/Plan: 1. Functional deficits which require 3+ hours per day of interdisciplinary therapy in a comprehensive inpatient rehab setting. Physiatrist is providing close team supervision and 24 hour management of active medical problems listed below. Physiatrist and rehab team continue to assess barriers to discharge/monitor patient progress toward functional and medical goals  Care Tool:  Bathing    Body parts bathed by patient: Right arm, Left arm,  Chest, Abdomen, Front perineal area, Buttocks, Right upper leg, Face, Left lower leg, Right lower leg, Left upper leg         Bathing assist Assist Level: Supervision/Verbal cueing     Upper Body Dressing/Undressing Upper body dressing   What is the patient wearing?: Pull over shirt    Upper body assist Assist Level: Set up assist    Lower Body Dressing/Undressing Lower body dressing      What is the patient wearing?: Underwear/pull up, Pants     Lower body assist Assist for lower body dressing: Set up assist     Toileting Toileting    Toileting assist Assist for toileting: Supervision/Verbal cueing     Transfers Chair/bed transfer  Transfers assist     Chair/bed transfer assist level: Supervision/Verbal cueing     Locomotion Ambulation   Ambulation assist      Assist level: Minimal Assistance - Patient > 75% Assistive device: No Device Max distance: 150   Walk 10 feet activity   Assist     Assist level: Contact Guard/Touching assist Assistive device: No Device   Walk 50 feet activity   Assist    Assist level: Contact Guard/Touching assist Assistive device: No Device    Walk 150  feet activity   Assist    Assist level: Minimal Assistance - Patient > 75% Assistive device: No Device    Walk 10 feet on uneven surface  activity   Assist Walk 10 feet on uneven surfaces activity did not occur: Safety/medical concerns   Assist level: Minimal Assistance - Patient > 75% Assistive device: Other (comment) (no device.)   Wheelchair     Assist Is the patient using a wheelchair?: No   Wheelchair activity did not occur: N/A         Wheelchair 50 feet with 2 turns activity    Assist    Wheelchair 50 feet with 2 turns activity did not occur: N/A       Wheelchair 150 feet activity     Assist  Wheelchair 150 feet activity did not occur: N/A       Blood pressure 131/78, pulse 65, temperature 98 F (36.7 C), temperature source Oral, resp. rate 18, height 5\' 5"  (1.651 m), weight 87.2 kg, SpO2 98%.    Medical Problem List and Plan: 1. 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  Antithrombotics: -DVT/anticoagulation:  Pharmaceutical: Lovenox             -antiplatelet therapy: Aspirin and Plavix for three weeks followed by aspirin alone   3. Pain Management: Tylenol, Fioricet as needed   4. Mood/Behavior/Sleep: LCSW to evaluate and provide emotional support             -antipsychotic agents: n/a   5. Neuropsych/cognition: This patient is capable of making decisions  on her own behalf.   6. Skin/Wound Care: Routine skin care checks   7. Fluids/Electrolytes/Nutrition: Routine Is and Os and follow-up chemistries             -on heart healthy diet with thin liquids              -continue folic acid and MVI   8: New onset seizure:             -continue Depakote 500 mg every 8 hours             -Follow-up neurology as outpatient              - EEG 11/14 with left cortical dysfunction but otherwise unremarkable--spoke with pt regarding seizure treatment. Neurology feels she will need to be on prophylaxis life long   11/17 **Pt would like to speak with neurology about her seizures prior to discharge.   -will check vpa level and LFT's given persistent nausea   -may need to consider another agent given sx 9: Hyperlipidemia: continue statin   10: Headache: Fioricet as needed   11: Visual disturbance/hallucinations: changed from Keppra to Depakote on 11/13 (repeat MRI 11/13 acute punctate infarction left parietal cortex)              - Ongoing 11/14              12: Orbital floor fracture OD (no entrapment): -Fredderick Phenix, MD (ophthalmology) and Nelva Nay. Elijah Birk, MD (ENT) consulted on 11/10.  Recommend to reconsult in 1 week per hospitalist, Dr. Lowell Guitar.   13: IBS/C: reordered Linzess  (30-60 minutes before breakfast); patient wants control of this med, discussed with nursing and pharmacy, magnesium gluconate 250mg  ordered HS   14: History of hypertension: home Bystolic 10 mg and hydrochlorothiazide 20 mg currently held for permissive HTN              - Add PRN hydralazine 10 mg Q8H for Systolic >190, Diastolic >110  Restarted Bistolic 2.5mg  daily on 11/15--  -11/17 bp's improving except for one reading which was borderline--continue current regimen 16. Suboptimal potassim: kdur 10 ordered 11/15  LOS: 3 days A FACE TO FACE EVALUATION WAS PERFORMED  Ranelle Oyster 07/12/2023, 7:47 AM

## 2023-07-12 NOTE — Progress Notes (Signed)
Physical Therapy Session Note  Patient Details  Name: Chloe Holmes MRN: [id number] Date of Birth: November 14, 1945  Today's Date: 07/12/2023 PT Individual Time: 0630-1601 PT Individual Time Calculation (min): 45 min   Short Term Goals: Week 1:  PT Short Term Goal 1 (Week 1): STG = LTG d/t ELOS  Skilled Therapeutic Interventions/Progress Updates:  Pt was seen bedside in the am. Pt performed bed mobility with S. Pt able to don/doff socks and shoes with S. Pt performed all sit to stand and stand pivot transfers with S. Pt ambulated 150 and 80 feet without assistive device and contact guard with verbal cues. Pt performed cone taps and alternating step taps, 3 sets x 10 reps each with contact guard and verbal cues. Pt performed slalom cone course 60 feet without assistive device and contact guard with verbal cues. Pt performed cone course 60 feet x 5 with walkng stick in R hand and S with no loss of balance and more confidence as per patient. Pt returned to room ambulating with walking stick and S with no loss balance. Pt left sitting up in bed with all needs within reach and bed alarm on.   Therapy Documentation Precautions:  Precautions Precautions: Fall Precaution Comments: diplopia, facial/ orbital fractures Restrictions Weight Bearing Restrictions: No General:   Pain: No c/o pain  Therapy/Group: Individual Therapy  Rayford Halsted 07/12/2023, 12:24 PM

## 2023-07-12 NOTE — Progress Notes (Signed)
Occupational Therapy Session Note  Patient Details  Name: Chloe Holmes MRN: [id number] Date of Birth: 1946/05/28  Today's Date: 07/12/2023 OT Individual Time: 1500-1540 OT Individual Time Calculation (min): 40 min    Short Term Goals: Week 1:  OT Short Term Goal 1 (Week 1): STGs = LTGs  Skilled Therapeutic Interventions/Progress Updates:      Therapy Documentation Precautions:  Precautions Precautions: Fall Precaution Comments: diplopia, facial/ orbital fractures Restrictions Weight Bearing Restrictions: No General: "I live in [place] for part of the year!" Pt supine in bed upon OT arrival, agreeable to OT session.  Pain: no pain reported  ADL: Transfers: Pt ambulated without AD at supervision level throughout unit hallway from room><therapy gym no LOB/SOB  Exercises: Pt completed 15 minutes of arm bike backward in order to increase BUE/BLE strength and endurance in preparation for increased independence in ADLs such as bathing. No rest break, on level 6 resistance.   Other Treatments: Pt and OT discussed vision when ambulating d/t double vision when looking down. OT instructed pt to look in front of her when walking to scan the scene for safety and look ahead to prevent double vision.    Pt supine in bed with bed alarm activated, 2 bed rails up, call light within reach and 4Ps assessed.   Therapy/Group: Individual Therapy  Velia Meyer, OTD, OTR/L 07/12/2023, 3:56 PM

## 2023-07-12 NOTE — Consult Note (Signed)
ENT CONSULT:  Reason for Consult: Right orbital fracture  Referring Physician:  Suzanna Obey MD  HPI: Chloe Holmes is an 77 y.o. female who suffered a seizure vs. Stroke 07/09/23 with ground level fall, no admitted to PM&R, on aspirin/plavix and seizure medications. Dr. Elijah Birk with [facility] ENT initially consulted. I've been consulted for surgical management.   Patient has had improving diplopia daily with a major improvement over the last 48 hours. She notes diplopia primarily with downward gaze. None with resting, superior, lateral or medial gaze. She has been using tape on her bifocals to reduce diplopia. She is a former Engineer, civil (consulting).    Past Medical History:  Diagnosis Date   Arthritis    Diverticulosis    Dyspnea    At pre-op , pt  (a retired Insurance underwriter) reports past Hx of SOB that she found out was being caused by frequent PVCs, she believes the PVCs were related to her poor tolerance of daily statin medication regimen ; however reports resolution of SOB now that she is only taking statin three times a week and taking bystolic to manage dysrhythmia.   GERD (gastroesophageal reflux disease)    History of hiatal hernia    HLD (hyperlipidemia)    Hypertension     Past Surgical History:  Procedure Laterality Date   ABDOMINAL HYSTERECTOMY     CERVICAL DISC SURGERY     COLONOSCOPY  2016   DILATION AND CURETTAGE OF UTERUS     ESOPHAGEAL MANOMETRY N/A 10/03/2013   Procedure: ESOPHAGEAL MANOMETRY (EM);  Surgeon: Beverley Fiedler, MD;  Location: [facility] ENDOSCOPY;  Service: Gastroenterology;  Laterality: N/A;   EYE SURGERY     bilateral cataracts    ORBITAL FRACTURE SURGERY     TOTAL KNEE ARTHROPLASTY Left 01/27/2019   Procedure: TOTAL KNEE ARTHROPLASTY;  Surgeon: Durene Romans, MD;  Location: [facility] ORS;  Service: Orthopedics;  Laterality: Left;  70 mins    Family History  Problem Relation Age of Onset   Heart disease Mother    Cancer Mother        colon   Colon cancer Mother    Hypertension  Father    Heart disease Father    Lung cancer Father    Cancer Father        lung   Stomach cancer Paternal Grandfather    Esophageal cancer Neg Hx    Rectal cancer Neg Hx    Breast cancer Neg Hx     Social History:  reports that she has never smoked. She has never used smokeless tobacco. She reports current alcohol use of about 7.0 - 10.0 standard drinks of alcohol per week. She reports that she does not use drugs.  Allergies:  Allergies  Allergen Reactions   Morphine Other (See Comments)    Hypotension; had to be narcan'd . This was 40 years ago   Pantoprazole Cough    Medications: I have reviewed the patient's current medications.  No results found for this or any previous visit (from the past 48 hour(s)).  No results found.  RKY:HCWCBJSE other than stated per HPI  Blood pressure 131/78, pulse 65, temperature 98 F (36.7 C), temperature source Oral, resp. rate 18, height 5\' 5"  (1.651 m), weight 87.2 kg, SpO2 98%.  PHYSICAL EXAM:  CONSTITUTIONAL: well developed, nourished, no distress and alert and oriented x 3 EYES: PERRL, EOMI. Diplopia at extreme of downward gaze.  VA 20:40 OU, OS.   HENT: Head : Right facial ecchymosis / soft tissue  hematoma.  Ears: External ears normal.  Nose: MMM Mouth/Throat: MMM NECK: supple, trachea normal and no thyromegaly or cervical LAD NEURO: CN II-XII symmetric intact   Studies Reviewed: CT Maxillofacial 07/05/23 IMPRESSION: 1. Acute Right Orbital floor and lamina papyracea fractures. Comminuted and displaced right floor fracture with herniated intraorbital fat, marginally herniated inferior rectus muscle. Minimal intraorbital contusion associated. Hemorrhage in the right maxillary sinus. 2. No other acute facial fracture identified. Additional right face and periorbital superficial hematoma/contusion. 3. 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     Electronically Signed   By: Odessa Fleming M.D.   On:  07/05/2023 05:04    Assessment/Plan: Right orbital floor and medial wall blowout fracture after GLF from seizure vs. Stroke. She has had improving diplopia since the injury, now just with extreme of downward gaze. No gross enophthalmos or hypo-globus. Discussed surgery in detail with patient including my recommendation against surgery.   Recommendations: - Defer surgical intervention for now given absence of indications for surgery (risks outweigh benefits) - F/u in 1 month for repeat evaluation - Consider eye patching PRN symptomatic diplopia  I have personally spent 31 minutes involved in face-to-face and non-face-to-face activities for this patient on the day of the visit.  Professional time spent includes the following activities, in addition to those noted in the documentation: preparing to see the patient (eg, review of tests), obtaining and/or reviewing separately obtained history, performing a medically appropriate examination and/or evaluation, counseling and educating the patient/family/caregiver, ordering medications, tests or procedures, referring and communicating with other healthcare professionals, documenting clinical information in the electronic or other health record, independently interpreting results and communicating results with the patient/family/caregiver, care coordination.  Electronically signed by:  Scarlette Ar, MD  Staff Physician Facial Plastic & Reconstructive Surgery Otolaryngology - Head and Neck Surgery [facility]   07/12/2023, 11:10 AM

## 2023-07-12 NOTE — IPOC Note (Signed)
Overall Plan of Care [facility]) Patient Details Name: Chloe Holmes MRN: [id number] DOB: [birth year]  Admitting Diagnosis: Seizure disorder [facility]), small stroke  Hospital Problems: Principal Problem:   Seizure disorder [facility])     Functional Problem List: Nursing Safety, Endurance, Medication Management  PT Balance, Edema, Endurance, Motor, Nutrition, Pain, Safety  OT Balance, Endurance, Motor, Vision  SLP    TR         Basic ADL's: OT Grooming, Bathing, Dressing, Toileting     Advanced  ADL's: OT Simple Meal Preparation, Laundry, Light Housekeeping     Transfers: PT Bed to Chair, Car, Lobbyist, Technical brewer: PT Ambulation, Stairs     Additional Impairments: OT None  SLP        TR      Anticipated Outcomes Item Anticipated Outcome  Self Feeding independent  Swallowing      Basic self-care  Mod Ind  Toileting  Mod Ind   Bathroom Transfers Mod Ind  Bowel/Bladder  n/a  Transfers  supervision  Locomotion  CGA/ supervision  Communication     Cognition     Pain  n/a  Safety/Judgment  manage w cues   Therapy Plan: PT Intensity: Minimum of 1-2 x/day ,45 to 90 minutes PT Frequency: 5 out of 7 days PT Duration Estimated Length of Stay: 5-7 days OT Intensity: Minimum of 1-2 x/day, 45 to 90 minutes OT Duration/Estimated Length of Stay: 5-7 days SLP Duration/Estimated Length of Stay: 7 days   Team Interventions: Nursing Interventions Patient/Family Education, Medication Management, Discharge Planning, Disease Management/Prevention  PT interventions Ambulation/gait training, Community reintegration, DME/adaptive equipment instruction, Neuromuscular re-education, Psychosocial support, Stair training, UE/LE Strength taining/ROM, UE/LE Coordination activities, Therapeutic Activities, Skin care/wound management, Pain management, Functional electrical stimulation, Discharge planning, Balance/vestibular training, Cognitive  remediation/compensation, Disease management/prevention, Functional mobility training, Patient/family education, Splinting/orthotics, Therapeutic Exercise, Visual/perceptual remediation/compensation  OT Interventions Warden/ranger, DME/adaptive equipment instruction, Discharge planning, Patient/family education, Therapeutic Exercise, Therapeutic Activities, UE/LE Coordination activities, Visual/perceptual remediation/compensation, Psychosocial support, Self Care/advanced ADL retraining, Neuromuscular re-education, Functional mobility training  SLP Interventions    TR Interventions    SW/CM Interventions Discharge Planning, Psychosocial Support, Patient/Family Education, Disease Management/Prevention   Barriers to Discharge MD  Medical stability  Nursing Lack of/limited family support 1 level 1 ste, no rails solo; family and friends to work out 24/7 care  PT Decreased caregiver support, Home environment Best boy, Lack of/limited family support    OT      SLP      SW Decreased caregiver support, Lack of/limited family support     Team Discharge Planning: Destination: PT-Home ,OT- Home , SLP-Home Projected Follow-up: PT-Outpatient PT, 24 hour supervision/assistance, OT-  Outpatient OT, SLP-None Projected Equipment Needs: PT-To be determined, OT- None recommended by OT, SLP-None recommended by SLP Equipment Details: PT- , OT-pt has a built in shower bench Patient/family involved in discharge planning: PT- Patient,  OT-Patient, SLP-Patient  MD ELOS: 5-7 days Medical Rehab Prognosis:  Excellent Assessment: The patient has been admitted for CIR therapies with the diagnosis of seizure disorder, small stroke. The team will be addressing functional mobility, strength, stamina, balance, safety, adaptive techniques and equipment, self-care, bowel and bladder mgt, patient and caregiver education, NMR, community reentry. Goals have been set at mod I for self-care and supervision for  mobility. Anticipated discharge destination is home with family.        See Team Conference Notes for weekly updates to the plan of care

## 2023-07-12 NOTE — Progress Notes (Signed)
Occupational Therapy Session Note  Patient Details  Name: Sharell Fetherolf MRN: [id number] Date of Birth: 07/02/1946  Today's Date: 07/12/2023 OT Individual Time: 0200-0230 OT Individual Time Calculation (min): 30 min    Short Term Goals: Week 1:  OT Short Term Goal 1 (Week 1): STGs = LTGs  Skilled Therapeutic Interventions/Progress Updates:    Patient resting in bed at the time of arrrival, patient indicated that she rested well and had no  report of pain.  Patient in agreement with completing UB exercises to improve UB strength. The pt initiated therapy session by completing pencil exercises 7x followed by the figure eight eye exercises. The pt went on to complete UB exercise using the 3lb dumb bell  2 sets of 10 for bicep curls and shld flexion, however, based on time constraints she was able to demonstrate knowledge for completing horizontal abduction, elbow extension and pull ups for effective carryover. .  At the end of the session the pt returned to bed LOF with her call light and bedside table within reach.   Therapy Documentation Precautions:  Precautions Precautions: Fall Precaution Comments: diplopia, facial/ orbital fractures Restrictions Weight Bearing Restrictions: No   Therapy/Group: Individual Therapy  Lavona Mound 07/12/2023, 3:57 PM

## 2023-07-12 NOTE — Progress Notes (Signed)
Occupational Therapy Session Note  Patient Details  Name: Chloe Holmes MRN: [id number] Date of Birth: 02-21-1946  Session 1: {CHL IP REHAB OT TIME CALCULATIONS:304400400} Session 2: {CHL IP REHAB OT TIME CALCULATIONS:304400400}  Short Term Goals: Week 1:  OT Short Term Goal 1 (Week 1): STGs = LTGs  Skilled Therapeutic Interventions/Progress Updates:    Session 1: Patient agreeable to participate in OT session. Reports *** pain level.   Patient participated in skilled OT session focusing on ***. Therapist facilitated/assessed/developed/educated/integrated/elicited *** in order to improve/facilitate/promote   Session 2: Patient agreeable to participate in OT session. Reports *** pain level.   Patient participated in skilled OT session focusing on ***. Therapist facilitated/assessed/developed/educated/integrated/elicited *** in order to improve/facilitate/promote    Therapy Documentation Precautions:  Precautions Precautions: Fall Precaution Comments: diplopia, facial/ orbital fractures Restrictions Weight Bearing Restrictions: No  Therapy/Group: Individual Therapy  Limmie Patricia, OTR/L,CBIS  Supplemental OT - [facility] and [facility] Secure Chat Preferred   07/12/2023, 10:06 PM

## 2023-07-13 DIAGNOSIS — G40909 Epilepsy, unspecified, not intractable, without status epilepticus: Secondary | ICD-10-CM | POA: Diagnosis not present

## 2023-07-13 DIAGNOSIS — I6389 Other cerebral infarction: Secondary | ICD-10-CM | POA: Diagnosis not present

## 2023-07-13 DIAGNOSIS — M48062 Spinal stenosis, lumbar region with neurogenic claudication: Secondary | ICD-10-CM | POA: Diagnosis not present

## 2023-07-13 DIAGNOSIS — S0231XS Fracture of orbital floor, right side, sequela: Secondary | ICD-10-CM

## 2023-07-13 LAB — VALPROIC ACID LEVEL: Valproic Acid Lvl: 105 ug/mL — ABNORMAL HIGH (ref 50.0–100.0)

## 2023-07-13 LAB — CBC
HCT: 37.3 % (ref 36.0–46.0)
Hemoglobin: 12.3 g/dL (ref 12.0–15.0)
MCH: 29.9 pg (ref 26.0–34.0)
MCHC: 33 g/dL (ref 30.0–36.0)
MCV: 90.5 fL (ref 80.0–100.0)
Platelets: 196 10*3/uL (ref 150–400)
RBC: 4.12 MIL/uL (ref 3.87–5.11)
RDW: 12.2 % (ref 11.5–15.5)
WBC: 4.7 10*3/uL (ref 4.0–10.5)
nRBC: 0 % (ref 0.0–0.2)

## 2023-07-13 LAB — COMPREHENSIVE METABOLIC PANEL
ALT: 19 U/L (ref 0–44)
AST: 23 U/L (ref 15–41)
Albumin: 3.4 g/dL — ABNORMAL LOW (ref 3.5–5.0)
Alkaline Phosphatase: 51 U/L (ref 38–126)
Anion gap: 7 (ref 5–15)
BUN: 19 mg/dL (ref 8–23)
CO2: 25 mmol/L (ref 22–32)
Calcium: 8.7 mg/dL — ABNORMAL LOW (ref 8.9–10.3)
Chloride: 102 mmol/L (ref 98–111)
Creatinine, Ser: 0.85 mg/dL (ref 0.44–1.00)
GFR, Estimated: >60 mL/min (ref >60)
Glucose, Bld: 85 mg/dL (ref 70–99)
Potassium: 3.9 mmol/L (ref 3.5–5.1)
Sodium: 134 mmol/L — ABNORMAL LOW (ref 135–145)
Total Bilirubin: 0.7 mg/dL (ref <1.2)
Total Protein: 6.2 g/dL — ABNORMAL LOW (ref 6.5–8.1)

## 2023-07-13 NOTE — Progress Notes (Signed)
Physical Therapy Session Note  Patient Details  Name: Chloe Holmes MRN: [id number] Date of Birth: 25-Jun-1946  Today's Date: 07/13/2023 PT Individual Time: 0811-0920 PT Individual Time Calculation (min): 69 min   Short Term Goals: Week 1:  PT Short Term Goal 1 (Week 1): STG = LTG d/t ELOS  Skilled Therapeutic Interventions/Progress Updates:  Patient supine in bed on entrance to room. Patient alert and agreeable to PT session.   Patient with no pain complaint at start of session. Requests to shower prior to dressing for therapy.   Pt relates that her vision has greatly improved with less diplopia and only notes her double vision when tilting head down and looking down. Tape removed from glasses. Also relates that ENT MD stopped in to visit with her and informed her that movement in her cheek is a hematoma and not a fracture. If orbital fracture present, should be managed conservatively and should not need surgery. Requested pt to cancel Wed appointment and to make an appointment with him in a few weeks instead. So pt may not need to d/c on Wed.   Therapeutic Activity: Bed Mobility: Pt performed supine > sit with supervision/ Mod I. VC/ tc required for taking time after transition to self-assess for dizziness. While seated EOB, pt provided with her bags and collects clothing for the day. . Transfers: Pt performed sit<>stand and stand pivot transfers throughout session with supervision. No vc required for technique. Participates in shower after therapist places towels for safety/ comfort. Completes shower and dressing with distant supervision.   Gait Training:  Pt ambulated >100 ft using walking stick with CGA/ supervision. Demonstrated improved balance with reduced but mild path deviation. Provided vc/ tc for focus on goal/ destination.  Therapeutic Exercise: Pt guided in continuous reciprocation of BUE and BLE using NuStep L6 x and then x47min with focus on maintaining RPE of  11-12 and METs ~2.6-3.0. MD rounding on pt at mark and discussed ENT visit with pt as well as pt's concern re: seizure diagnosis and state law requiring at least 6mos driving restriction following diagnosis.   Patient seated EOB at end of session with brakes locked, no alarm set, and all needs within reach. RN and nursing student present to provide pt with morning meds.    Therapy Documentation Precautions:  Precautions Precautions: Fall Precaution Comments: diplopia, facial/ orbital fractures Restrictions Weight Bearing Restrictions: No  Pain:  No pain related this session.   Therapy/Group: Individual Therapy  Loel Dubonnet PT, DPT, CSRS 07/13/2023, 8:28 AM

## 2023-07-13 NOTE — Plan of Care (Signed)
  Problem: RH SAFETY Goal: RH STG ADHERE TO SAFETY PRECAUTIONS W/ASSISTANCE/DEVICE Description: STG Adhere to Safety Precautions With cues Assistance/Device. Outcome: Progressing   Problem: RH KNOWLEDGE DEFICIT Goal: RH STG INCREASE KNOWLEDGE OF HYPERTENSION Description: Patient will be able to manage HTN with medications and dietary modification using educational resources independently Outcome: Progressing Goal: RH STG INCREASE KNOWLEDGE OF STROKE PROPHYLAXIS Description: Patient will be able to manage secondary risks with medications and dietary modification using educational resources independently Outcome: Progressing

## 2023-07-13 NOTE — Progress Notes (Signed)
PROGRESS NOTE   Subjective/Complaints:  DIscussed LE weakness  ENT eval appreciated DIscussed seizure d/o treatment  ROS: Patient denies CP, SOB, N/V/D    Objective:   No results found. Recent Labs    07/13/23 0459  WBC 4.7  HGB 12.3  HCT 37.3  PLT 196   Recent Labs    07/13/23 0459  NA 134*  K 3.9  CL 102  CO2 25  GLUCOSE 85  BUN 19  CREATININE 0.85  CALCIUM 8.7*    Intake/Output Summary (Last 24 hours) at 07/13/2023 0851 Last data filed at 07/13/2023 0714 Gross per 24 hour  Intake 960 ml  Output --  Net 960 ml        Physical Exam: Vital Signs Blood pressure (!) 148/87, pulse (!) 58, temperature 98.2 F (36.8 C), temperature source Oral, resp. rate 18, height 5\' 5"  (1.651 m), weight 87.2 kg, SpO2 99%.  General: No acute distress Mood and affect are appropriate Heart: Regular rate and rhythm no rubs murmurs or extra sounds Lungs: Clear to auscultation, breathing unlabored, no rales or wheezes Abdomen: Positive bowel sounds, soft nontender to palpation, nondistended Extremities: No clubbing, cyanosis, or edema Skin: No evidence of breakdown, no evidence of rash   Skin: + R facial bruising as above + R AC erythema and raised area over prior IV; no warmth, fluctuance, or drainage + R 2nd toe wound -no changes 11/17   MSK:      R second hammertoe       S/p L TKA       Neurologic exam:  Alert and oriented x 3. Normal insight and awareness. Intact Memory. Normal language and speech.  Spasticity: MAS 0 in all extremities.  Strength:        5/5 in BUE and BLE on MMT    Assessment/Plan: 1. Functional deficits which require 3+ hours per day of interdisciplinary therapy in a comprehensive inpatient rehab setting. Physiatrist is providing close team supervision and 24 hour management of active medical problems listed below. Physiatrist and rehab team continue to assess barriers to  discharge/monitor patient progress toward functional and medical goals  Care Tool:  Bathing    Body parts bathed by patient: Right arm, Left arm, Chest, Abdomen, Front perineal area, Buttocks, Right upper leg, Face, Left lower leg, Right lower leg, Left upper leg         Bathing assist Assist Level: Supervision/Verbal cueing     Upper Body Dressing/Undressing Upper body dressing   What is the patient wearing?: Pull over shirt    Upper body assist Assist Level: Set up assist    Lower Body Dressing/Undressing Lower body dressing      What is the patient wearing?: Underwear/pull up, Pants     Lower body assist Assist for lower body dressing: Set up assist     Toileting Toileting    Toileting assist Assist for toileting: Supervision/Verbal cueing     Transfers Chair/bed transfer  Transfers assist     Chair/bed transfer assist level: Supervision/Verbal cueing     Locomotion Ambulation   Ambulation assist      Assist level: Supervision/Verbal cueing Assistive device: Other (comment) (walking stick)  Max distance: 150   Walk 10 feet activity   Assist     Assist level: Supervision/Verbal cueing Assistive device:  (walkig stick)   Walk 50 feet activity   Assist    Assist level: Supervision/Verbal cueing Assistive device: Other (comment) (walking stick)    Walk 150 feet activity   Assist    Assist level: Supervision/Verbal cueing (walking stick) Assistive device: No Device (walking stick)    Walk 10 feet on uneven surface  activity   Assist Walk 10 feet on uneven surfaces activity did not occur: Safety/medical concerns   Assist level: Minimal Assistance - Patient > 75% Assistive device: Other (comment) (no device.)   Wheelchair     Assist Is the patient using a wheelchair?: No   Wheelchair activity did not occur: N/A         Wheelchair 50 feet with 2 turns activity    Assist    Wheelchair 50 feet with 2 turns  activity did not occur: N/A       Wheelchair 150 feet activity     Assist  Wheelchair 150 feet activity did not occur: N/A       Blood pressure (!) 148/87, pulse (!) 58, temperature 98.2 F (36.8 C), temperature source Oral, resp. rate 18, height 5\' 5"  (1.651 m), weight 87.2 kg, SpO2 99%.    Medical Problem List and Plan: 1. 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  Antithrombotics: -DVT/anticoagulation:  Pharmaceutical: Lovenox             -antiplatelet therapy: Aspirin and Plavix for three weeks followed by aspirin alone   3. Pain Management: Tylenol, Fioricet as needed   4. Mood/Behavior/Sleep: LCSW to evaluate and provide emotional support             -antipsychotic agents: n/a   5. Neuropsych/cognition: This patient is capable of making decisions on her own behalf.   6. Skin/Wound Care: Routine skin care checks   7. Fluids/Electrolytes/Nutrition: Routine Is and Os and follow-up chemistries             -on heart healthy diet with thin liquids             -continue folic acid and MVI   8: New onset seizure:             -continue Depakote 500 mg every 8 hours             -Follow-up neurology as outpatient              - EEG 11/14 with left cortical dysfunction but otherwise unremarkable--spoke with pt regarding seizure treatment. Neurology feels she will need to be on prophylaxis life long- will need to f/u with Neuro as OP    Discussed  driving post seizure .   - 9: Hyperlipidemia: continue statin   10: Headache: Fioricet as needed   11: Visual disturbance/hallucinations: changed from  Keppra to Depakote on 11/13 (repeat MRI 11/13 acute punctate infarction left parietal cortex)              - Ongoing 11/14              12: Orbital floor fracture OD (no entrapment): -Fredderick Phenix, MD (ophthalmology) and [facility]. Elijah Birk, MD (ENT) consulted on 11/10.  Recommend to reconsult in 1 week per hospitalist, Dr. Lowell Guitar. Right inferior rectus herniation which would result in decreased downward gaze  - do not think diplopia is related to CVA 13: IBS/C: reordered Linzess  (30-60 minutes before breakfast); patient wants control of this med, discussed with nursing and pharmacy, magnesium gluconate 250mg  ordered HS   14: History of hypertension: home Bystolic 10 mg and hydrochlorothiazide 20 mg currently held for permissive HTN              - Add PRN hydralazine 10 mg Q8H for Systolic >190, Diastolic >110  Restarted Bistolic 2.5mg  daily on 11/15--  -11/17 bp's improving except for one reading which was borderline--continue current regimen Vitals:   07/12/23 1908 07/13/23 0435  BP: (!) 149/84 (!) 148/87  Pulse: 65 (!) 58  Resp: 18 18  Temp: 97.9 F (36.6 C) 98.2 F (36.8 C)  SpO2: 98% 99%    16. Suboptimal potassim: kdur 10 ordered 11/15    Latest Ref Rng & Units 07/13/2023    4:59 AM 07/09/2023    8:17 AM 07/08/2023    4:38 AM  BMP  Glucose 70 - 99 mg/dL 85  99  98   BUN 8 - 23 mg/dL 19  20  20    Creatinine 0.44 - 1.00 mg/dL 7.82  9.56  2.13   Sodium 135 - 145 mmol/L 134  138  138   Potassium 3.5 - 5.1 mmol/L 3.9  3.9  3.9   Chloride 98 - 111 mmol/L 102  108  108   CO2 22 - 32 mmol/L 25  23  21    Calcium 8.9 - 10.3 mg/dL 8.7  8.9  8.7      LOS: 4 days A FACE TO FACE EVALUATION WAS PERFORMED  Chloe Holmes 07/13/2023, 8:51 AM

## 2023-07-14 DIAGNOSIS — S0231XS Fracture of orbital floor, right side, sequela: Secondary | ICD-10-CM | POA: Diagnosis not present

## 2023-07-14 DIAGNOSIS — G40909 Epilepsy, unspecified, not intractable, without status epilepticus: Secondary | ICD-10-CM | POA: Diagnosis not present

## 2023-07-14 DIAGNOSIS — M48062 Spinal stenosis, lumbar region with neurogenic claudication: Secondary | ICD-10-CM | POA: Diagnosis not present

## 2023-07-14 DIAGNOSIS — I6389 Other cerebral infarction: Secondary | ICD-10-CM | POA: Diagnosis not present

## 2023-07-14 MED ORDER — DIVALPROEX SODIUM 500 MG PO DR TAB
500.0000 mg | DELAYED_RELEASE_TABLET | Freq: Two times a day (BID) | ORAL | Status: DC
  Administered 2023-07-14 – 2023-07-17 (×6): 500 mg via ORAL
  Filled 2023-07-14 (×6): qty 1

## 2023-07-14 NOTE — Progress Notes (Addendum)
PROGRESS NOTE   Subjective/Complaints: Amb longer distances DIscussed LE weakness Discussed low back issues and spinal stenosis  ROS: Patient denies CP, SOB, N/V/D    Objective:   No results found. Recent Labs    07/13/23 0459  WBC 4.7  HGB 12.3  HCT 37.3  PLT 196   Recent Labs    07/13/23 0459  NA 134*  K 3.9  CL 102  CO2 25  GLUCOSE 85  BUN 19  CREATININE 0.85  CALCIUM 8.7*    Intake/Output Summary (Last 24 hours) at 07/14/2023 6578 Last data filed at 07/14/2023 0700 Gross per 24 hour  Intake 1137 ml  Output --  Net 1137 ml        Physical Exam: Vital Signs Blood pressure (!) 142/81, pulse 66, temperature 98.1 F (36.7 C), temperature source Oral, resp. rate 16, height 5\' 5"  (1.651 m), weight 87.2 kg, SpO2 98%.  General: No acute distress Mood and affect are appropriate Heart: Regular rate and rhythm no rubs murmurs or extra sounds Lungs: Clear to auscultation, breathing unlabored, no rales or wheezes Abdomen: Positive bowel sounds, soft nontender to palpation, nondistended Extremities: No clubbing, cyanosis, or edema Skin: No evidence of breakdown, no evidence of rash   Skin: + R facial bruising as above   MSK:             Neurologic exam:  Alert and oriented x 3. Normal insight and awareness. Intact Memory. Normal language and speech.  Spasticity: MAS 0 in all extremities.  Strength:        5/5 in BUE and BLE on MMT    Assessment/Plan: 1. Functional deficits which require 3+ hours per day of interdisciplinary therapy in a comprehensive inpatient rehab setting. Physiatrist is providing close team supervision and 24 hour management of active medical problems listed below. Physiatrist and rehab team continue to assess barriers to discharge/monitor patient progress toward functional and medical goals  Care Tool:  Bathing    Body parts bathed by patient: Right arm, Left arm, Chest,  Abdomen, Front perineal area, Buttocks, Right upper leg, Face, Left lower leg, Right lower leg, Left upper leg         Bathing assist Assist Level: Supervision/Verbal cueing     Upper Body Dressing/Undressing Upper body dressing   What is the patient wearing?: Pull over shirt    Upper body assist Assist Level: Set up assist    Lower Body Dressing/Undressing Lower body dressing      What is the patient wearing?: Underwear/pull up, Pants     Lower body assist Assist for lower body dressing: Set up assist     Toileting Toileting    Toileting assist Assist for toileting: Supervision/Verbal cueing     Transfers Chair/bed transfer  Transfers assist     Chair/bed transfer assist level: Supervision/Verbal cueing     Locomotion Ambulation   Ambulation assist      Assist level: Supervision/Verbal cueing Assistive device: Other (comment) (walking stick) Max distance: 150   Walk 10 feet activity   Assist     Assist level: Supervision/Verbal cueing Assistive device:  (walkig stick)   Walk 50 feet activity   Assist  Assist level: Supervision/Verbal cueing Assistive device: Other (comment) (walking stick)    Walk 150 feet activity   Assist    Assist level: Supervision/Verbal cueing (walking stick) Assistive device: No Device (walking stick)    Walk 10 feet on uneven surface  activity   Assist Walk 10 feet on uneven surfaces activity did not occur: Safety/medical concerns   Assist level: Minimal Assistance - Patient > 75% Assistive device: Other (comment) (no device.)   Wheelchair     Assist Is the patient using a wheelchair?: No             Wheelchair 50 feet with 2 turns activity    Assist            Wheelchair 150 feet activity     Assist          Blood pressure (!) 142/81, pulse 66, temperature 98.1 F (36.7 C), temperature source Oral, resp. rate 16, height 5\' 5"  (1.651 m), weight 87.2 kg, SpO2  98%.    Medical Problem List and Plan: 1. 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  Antithrombotics: -DVT/anticoagulation:  Pharmaceutical: Lovenox may d/c             -antiplatelet therapy: Aspirin and Plavix for three weeks followed by aspirin alone starting Jul 31, 2023   3. Pain Management: Tylenol, Fioricet as needed   4. Mood/Behavior/Sleep: LCSW to evaluate and provide emotional support             -antipsychotic agents: n/a   5. Neuropsych/cognition: This patient is capable of making decisions on her own behalf.   6. Skin/Wound Care: Routine skin care checks   7. Fluids/Electrolytes/Nutrition: Routine Is and Os and follow-up chemistries             -on heart healthy diet with thin liquids             -continue folic acid and MVI   8: New onset seizure:             -continue Depakote 500 mg every 8 hours- based on levels reduce to q12h             -Follow-up neurology as outpatient              - EEG 11/14 with left cortical dysfunction but otherwise unremarkable--spoke with pt regarding seizure treatment. Neurology feels she will need to be on prophylaxis life long- will need to f/u with Neuro as OP    Discussed driving post seizure .None per state low until seizure free x 6 mo, must be cleared by neuro    - 9: Hyperlipidemia: continue statin   10: Headache: Fioricet as needed   11: Visual disturbance/hallucinations: changed from Keppra to Depakote on 11/13 (repeat MRI 11/13 acute punctate infarction left parietal cortex)              - improved  12: Orbital  floor fracture OD (no entrapment): -Fredderick Phenix, MD (ophthalmology) and [facility]  Elijah Birk, MD (ENT) consulted on 11/10.  Recommend to reconsult in 1 week per hospitalist, Dr. Lowell Guitar. Right inferior rectus herniation which would result in decreased downward gaze  - do not think diplopia is related to CVA 13: IBS/C: reordered Linzess  (30-60 minutes before breakfast); patient wants control of this med, discussed with nursing and pharmacy, magnesium gluconate 250mg  ordered HS   14: History of hypertension: home Bystolic 10 mg and hydrochlorothiazide 20 mg currently held for permissive HTN              - Add PRN hydralazine 10 mg Q8H for Systolic >190, Diastolic >110  Restarted Bistolic 2.5mg  daily on 11/15--  -11/17 bp's improving except for one reading which was borderline--continue current regimen Vitals:   07/13/23 2005 07/14/23 0445  BP: (!) 161/83 (!) 142/81  Pulse: 62 66  Resp: 16 16  Temp: 98.1 F (36.7 C) 98.1 F (36.7 C)  SpO2: 100% 98%    16. Suboptimal potassim: kdur 10 ordered 11/15    Latest Ref Rng & Units 07/13/2023    4:59 AM 07/09/2023    8:17 AM 07/08/2023    4:38 AM  BMP  Glucose 70 - 99 mg/dL 85  99  98   BUN 8 - 23 mg/dL 19  20  20    Creatinine 0.44 - 1.00 mg/dL 1.61  0.96  0.45   Sodium 135 - 145 mmol/L 134  138  138   Potassium 3.5 - 5.1 mmol/L 3.9  3.9  3.9   Chloride 98 - 111 mmol/L 102  108  108   CO2 22 - 32 mmol/L 25  23  21    Calcium 8.9 - 10.3 mg/dL 8.7  8.9  8.7      LOS: 5 days A FACE TO FACE EVALUATION WAS PERFORMED  Victorino Sparrow Annalicia Renfrew 07/14/2023, 8:32 AM

## 2023-07-14 NOTE — Progress Notes (Signed)
Physical Therapy Session Note  Patient Details  Name: Chloe Holmes MRN: [id number] Date of Birth: 05-20-1946  Today's Date: 07/14/2023 PT Individual Time: 1120-1203 PT Individual Time Calculation (min): 43 min   Short Term Goals: Week 1:  PT Short Term Goal 1 (Week 1): STG = LTG d/t ELOS  Skilled Therapeutic Interventions/Progress Updates:  Patient supine in bed on entrance to room. Patient alert and agreeable to PT session.   Patient with no pain complaint at start of session. Relates continued improvement in vision.   Therapeutic Activity: Bed Mobility: Pt performed supine > sit with Mod I. Pt is able to reach shoes and don with Mod I while seated EOB. Discussion re: elastic laces for shoes and potential for use of [employer] silicone laces.  Transfers: Pt performed sit<>stand and stand pivot transfers throughout session with supervision. Pt also performs toilet transfer and hygiene with distant supervision. No cueing required.   Gait Training:  Pt ambulated >200' x2 using walking stick with supervision. Decrease path deviation noted this session. One instance of min/ mild stepping strategy to L used for misstep and minor balance shift. No lean into walking stick.    Neuromuscular Re-ed: NMR facilitated during session with focus on dynamic standing balance. Pt guided in touch to color discs placed on tray tables in front of pt. 3 colors on one tray table and 3 on other. Pt required to take one step in order to reach and allowed to self choose hand to reach and foot to step out. Started with slow call of colors with pt able to maintain balance throughout. Colors called faster throughout and ending with two called colors at one time with fast call of new sequence just prior to completion of prior. No LOB throughout and good awareness of self throughout.  One seated rest break between bouts.   NMR performed for improvements in motor control and coordination, balance,  sequencing, judgement, and self confidence/ efficacy in performing all aspects of mobility at highest level of independence.   Patient supine in bed at end of session with brakes locked, bed alarm set, and all needs within reach.   Therapy Documentation Precautions:  Precautions Precautions: Fall Precaution Comments: diplopia, facial/ orbital fractures Restrictions Weight Bearing Restrictions: No  Pain:  Pt with no pain complaint this session.    Therapy/Group: Individual Therapy  Loel Dubonnet PT, DPT, CSRS 07/14/2023, 4:49 PM

## 2023-07-14 NOTE — Progress Notes (Signed)
Patient ID: Chloe Holmes, female   DOB: 06/16/1946, 77 y.o.   MRN: [id number]  No DME or follow up recommendations currently.

## 2023-07-14 NOTE — Plan of Care (Signed)
  Problem: Consults Goal: RH STROKE PATIENT EDUCATION Description: See Patient Education module for education specifics  07/14/2023 0509 by Vella Raring, RN Outcome: Progressing 07/14/2023 0509 by Vella Raring, RN Outcome: Progressing   Problem: RH SAFETY Goal: RH STG ADHERE TO SAFETY PRECAUTIONS W/ASSISTANCE/DEVICE Description: STG Adhere to Safety Precautions With cues Assistance/Device. Outcome: Progressing   Problem: RH SAFETY Goal: RH STG ADHERE TO SAFETY PRECAUTIONS W/ASSISTANCE/DEVICE Description: STG Adhere to Safety Precautions With cues Assistance/Device. Outcome: Progressing   Problem: RH KNOWLEDGE DEFICIT Goal: RH STG INCREASE KNOWLEDGE OF HYPERTENSION Description: Patient will be able to manage HTN with medications and dietary modification using educational resources independently Outcome: Progressing Goal: RH STG INCREASE KNOWLEGDE OF HYPERLIPIDEMIA Description: Patient will be able to manage HLD with medications and dietary modification using educational resources independently Outcome: Progressing Goal: RH STG INCREASE KNOWLEDGE OF STROKE PROPHYLAXIS Description: Patient will be able to manage secondary risks with medications and dietary modification using educational resources independently Outcome: Progressing

## 2023-07-14 NOTE — Plan of Care (Signed)
  Problem: Sit to Stand Goal: LTG:  Patient will perform sit to stand with assistance level (PT) Description: LTG:  Patient will perform sit to stand with assistance level (PT) Flowsheets (Taken 07/14/2023 1719) LTG: PT will perform sit to stand in preparation for functional mobility with assistance level: Independent   Problem: RH Bed Mobility Goal: LTG Patient will perform bed mobility with assist (PT) Description: LTG: Patient will perform bed mobility with assistance, with/without cues (PT). Flowsheets (Taken 07/11/2023 [phone]) LTG: Pt will perform bed mobility with assistance level of: Independent   Problem: RH Bed to Chair Transfers Goal: LTG Patient will perform bed/chair transfers w/assist (PT) Description: LTG: Patient will perform bed to chair transfers with assistance (PT). Flowsheets (Taken 07/14/2023 1719) LTG: Pt will perform Bed to Chair Transfers with assistance level: Independent with assistive device    Problem: RH Furniture Transfers Goal: LTG Patient will perform furniture transfers w/assist (OT/PT) Description: LTG: Patient will perform furniture transfers  with assistance (OT/PT). Flowsheets (Taken 07/14/2023 1719) LTG: Pt will perform furniture transfers with assist:: Independent with assistive device

## 2023-07-14 NOTE — Progress Notes (Signed)
Occupational Therapy Session Note  Patient Details  Name: Chloe Holmes MRN: [id number] Date of Birth: 14-Feb-1946  Today's Date: 07/14/2023 OT Individual Time: 4098-1191 OT Individual Time Calculation (min): 65 min    Short Term Goals: Week 1:  OT Short Term Goal 1 (Week 1): STGs = LTGs  Skilled Therapeutic Interventions/Progress Updates:    Pt received in room ready for therapy. Reassessed vision today.  Pt has improved motor control of R eye and now only has diplopia in B lower quadrants (but is relieved by tilting head down) or gazing past 3 feet in front of her at eye level when ambulating. Pt had been looking down in front of feet when walking before. Pt completed grooming tasks without occlusion on glasses and felt she was not having diplopia.    She engaged in pencil pushups which did fatigue her eyes, recommended to only do 5 at a time.  Once her eyes were relaxed she ambulated with walking pole to gym to do standing exercises at [facility] with a focus on fast visual tracking and scanning. Pt did well with activities.   She practiced reaching into upper and lower cupboards to simulate reaching for items in kitchen. Pt had no difficulty.    Used scifit arm bike in standing to challenge UB strength and endurance at resistance 8 for 5 minutes and then ambulated back to room.    Discussed her set up/plan for grocery shopping, meal prep, housekeeping and laundry at home. Pt has no concerns about completing those tasks.     Pt resting in bed with all needs met.    Therapy Documentation Precautions:  Precautions Precautions: Fall Precaution Comments: diplopia, facial/ orbital fractures Restrictions Weight Bearing Restrictions: No      Pain: no pain    ADL: ADL ADL Comments: pt is set up to distant supervision with all self care and ADL transfers      Therapy/Group: Individual Therapy  Karimah Winquist 07/14/2023, 12:23 PM

## 2023-07-14 NOTE — Progress Notes (Signed)
Physical Therapy Session Note  Patient Details  Name: Chloe Holmes MRN: [id number] Date of Birth: [birth year]  Today's Date: 07/14/2023 PT Individual Time: 1005-1101; 1421 - 1504 PT Individual Time Calculation (min): 56 min; 43 min   Short Term Goals: Week 1:  PT Short Term Goal 1 (Week 1): STG = LTG d/t ELOS  SESSION 1 Skilled Therapeutic Interventions/Progress Updates: Patient supine in bed on entrance to room. Patient alert and agreeable to PT session.   Patient reported no pain at beginning of session. Pt wanted to continue working on lower body strengthening this session.  Therapeutic Activity: Bed Mobility: Pt performed supine<>sit on EOB with modI (HOB slightly elevated). No VC required.  Transfers: Pt performed sit<>stand transfers throughout session with walking stick (supervision). No VC required.   Gait Training:  Pt ambulated from room<>main gym using walking stick with supervision. Pt demonstrated the following gait deviations with therapist providing the described cuing and facilitation for improvement:  - Decreased B foot clearance (pt recalled previous therapy cues of increasing height) - Pt noted to have increase in balance since walking without AD several days ago with this PTA - Gait deviations to the R and L with VC for pt to look forward ahead instead of down at ground with noted increase in balance.  Therapeutic Exercise: Pt performed the following exercises with therapist providing the described cuing and facilitation for improvement. - LAQ B LE with 10lb ankle weight x 30 reps with no rest break (VC to control movement) - Standing HS curls with B UE support on hi/low mat (raised high). Started with 7.5lbs (x 10 reps) on R LE with pt reporting slight pull in lateral aspect popliteal fossa (moved to 5lbs - 1 x 10, 1 x 15 - with pt cued to keep R ankle in neutral position vs slightly dorsiflexed to decrease strain on gastrocnemius with pt reporting decrease in  pull). HS curl standing with L LE at 7.5 lbs 2 x 10, 1 x 15. Pt also cued to return LE to flat foot position vs on toes.  - Sit to stands on hi/low mat lowered with 10 lbs bar (2 5lb bars cobanned together) with VC to avoid lifting B LE off floor when sitting. Pt recalled previous cue to avoid using back of knee to assist with standing on edge of mat.   Patient supine in bed at end of session with brakes locked, bed alarm set, and all needs within reach.  SESSION 2 Skilled Therapeutic Interventions/Progress Updates: Patient supine in bed on entrance to room. Patient alert and agreeable to PT session.   Patient reported no pain at beginning of session.   Therapeutic Activity: Bed Mobility: Pt performed supine<>sit on EOB with modI. VC Transfers: Pt performed sit<>stand transfers throughout session with walking stick and with supervision for safety. Pt ambulated short distance from bed to toilet to void bladder (charted) with distant supervision. Pt performed self hygiene in bathroom, and then at sink to wash hands prior to leaving room.  Gait Training:  Pt ambulated from room<>day room gym using walking stick with supervision. Pt recalled previous cue from session to look forward ahead vs down at ground in order to increase dynamic standing balance (decrease in gait deviations when doing so). Pt also self noted to increase step clearance bilaterally throughout session without cues required.   Neuromuscular Re-ed: NMR facilitated during session with focus on dynamic standing balance, dual-tasking, and memory recall. - Pt to grab sequenced color cones called  out by therapist (4 colors) on at a time to place in basketball net (net tied together with pins) while having to navigate bowling pins. Pt performed task without issue in recalling sequence, with walking stick and with supervision throughout. - Challenge increased to pt having to walk to cones set on 3 tables. Pt instructed to stack empty cone  with big base onto another cone (big base) with colored disc between. Pt then to navigate while holding 2 sets of stacked cones in B UE's while navigating bowling pins. Pt required VC to recall only placing each cone with big base to big base. Pt performed task with no AD and close supervision for safety and required VC to coordinate required movement of UE's to ensure that cones don't fall (basket ball goal set over head of pt in order to increase challenge to reaching over head for objects at home). Pt then wanted to see if she could place one stack of cones with small base to small base and disc placed between. Pt demonstrated ability to do so with required increase in focus to motor attention to place into basket without them falling.   NMR performed for improvements in motor control and coordination, balance, sequencing, judgement, and self confidence/ efficacy in performing all aspects of mobility at highest level of independence.   Therapeutic Exercise: Pt performed the following exercises with therapist providing the described cuing and facilitation for improvement. - NuStep 15 min on level 6 with pt performing 1080 steps without a rest break. Pt adjusted self in Nustep without assistance. Pt performed from self report of wanting to do so to work on increasing cardiovascular endurance and B LE strengthening with resistance.   Patient sitting EOB at end of session with brakes locked, bed alarm set, and all needs within reach.       Therapy Documentation Precautions:  Precautions Precautions: Fall Precaution Comments: diplopia, facial/ orbital fractures Restrictions Weight Bearing Restrictions: No  Therapy/Group: Individual Therapy  Uriel Dowding PTA 07/14/2023, 12:15 PM

## 2023-07-15 DIAGNOSIS — M48062 Spinal stenosis, lumbar region with neurogenic claudication: Secondary | ICD-10-CM | POA: Diagnosis not present

## 2023-07-15 DIAGNOSIS — G40909 Epilepsy, unspecified, not intractable, without status epilepticus: Secondary | ICD-10-CM | POA: Diagnosis not present

## 2023-07-15 DIAGNOSIS — S0231XS Fracture of orbital floor, right side, sequela: Secondary | ICD-10-CM | POA: Diagnosis not present

## 2023-07-15 DIAGNOSIS — I6389 Other cerebral infarction: Secondary | ICD-10-CM | POA: Diagnosis not present

## 2023-07-15 MED ORDER — NEBIVOLOL HCL 5 MG PO TABS
5.0000 mg | ORAL_TABLET | Freq: Every day | ORAL | Status: DC
  Administered 2023-07-16 – 2023-07-17 (×2): 5 mg via ORAL
  Filled 2023-07-15 (×2): qty 1

## 2023-07-15 NOTE — Progress Notes (Signed)
Occupational Therapy Session Note  Patient Details  Name: Chloe Holmes MRN: [id number] Date of Birth: 10-15-1945  Today's Date: 07/15/2023 OT Individual Time: 0922-1000 OT Individual Time Calculation (min): 38 min    Short Term Goals: Week 1:  OT Short Term Goal 1 (Week 1): STGs = LTGs  Skilled Therapeutic Interventions/Progress Updates:    Pt received in room ready for therapy. Pt reported she had completed shower earlier with nurse tech present.    Pt ambulated to gym to work on dynamic balance and general strengthening exercises to increase her endurance for IADLs.  Pt ambulated with walking pole with no A needed.  In gym, used 5 lb dowel bar for bicep curls, shoulder presses and core strength with "kayaking" exercises.  Sit to stands adding in modified dead lift using bar for back strength and balance,  standing chest press, eccentric stands to sit.  Using steps, pt worked on tap ups.  Tried eccentric step downs but pt felt knee "crunching". Pt returned to sitting edge of mat and used theraband around ankles for knee extension exercises. Pt reporting her diplopia is progressively improving.  Pt then ambulated back to her room with all needs met.   Therapy Documentation Precautions:  Precautions Precautions: Fall Precaution Comments: diplopia, facial/ orbital fractures Restrictions Weight Bearing Restrictions: No   Pain:  No c/o pain  ADL: ADL ADL Comments: pt is set up to distant supervision with all self care and ADL transfers   Therapy/Group: Individual Therapy  Earline Stiner 07/15/2023, 10:16 AM

## 2023-07-15 NOTE — Progress Notes (Signed)
Physical Therapy Session Note  Patient Details  Name: Chloe Holmes MRN: [id number] Date of Birth: [birth year]  Today's Date: 07/15/2023 PT Individual Time: 7425-9563 PT Individual Time Calculation (min): 71 min   Short Term Goals: Week 1:  PT Short Term Goal 1 (Week 1): STG = LTG d/t ELOS  Skilled Therapeutic Interventions/Progress Updates:  Patient supine in bed on entrance to room. Patient alert and agreeable to PT session.   Patient with no pain complaint at start of session.  Therapeutic Activity: Bed Mobility: Pt performed supine <> sit with Mod I. No cueing required.  Transfers: Pt performed sit<>stand and stand pivot transfers throughout session with Mod I overall and later in session provided supervision with BLE fatigue. Pt does relate past need for momentum build in order to rise to stand especially from lower heights seats including son's couch. Provided with education and demonstration of biomechanics behind sit<>stand transfers. With demo of foot placement, appropriate hip hinge, pt is able to control slow rise to stand and descent to sit with no momentum build and no UE use to assist.  Gait Training:  Pt ambulated >200 x2 ft using walking stick with supervision to/ from day room. Demonstrated good pace with consistent steps and mild intermittent path deviations. No c/o diplopia. Provided vc/ tc for maintaining level gaze with intermittent glances don to place ~3-60ft in front of pt and back to level gaze.  On return trip to room, pt guided in lateral stepping to each side for >50 ft in order to facilitate additional hip strengthening for more control in balance.   Therapeutic Exercise: Pt relates that she has not performed Nustep this day and wishes to continue to work on general strengthening and activity tolerance.  Pt guided in continuous reciprocation of BUE and BLE using NuStep L6 x with focus on maintaining pace ~60 steps/min and has noted MET level 2.6-3.0  throughout. RPE at 12 and able to hold conversation throughout.   Pt relates feeling grateful for additional few days in therapy and feels like she is finally working on things that will help her. Related that PT is recommending to continue at OPPT with clinicians she is familiar with at [facility] in order to continue to progress healing from head injury, stroke with focus on balance, strengthening.   Patient supine in bed at end of session with brakes locked, bed alarm set, and all needs within reach.  Therapy Documentation Precautions:  Precautions Precautions: Fall Precaution Comments: diplopia, facial/ orbital fractures Restrictions Weight Bearing Restrictions: No  Pain:  No complaint of pain this session.   Therapy/Group: Individual Therapy  Loel Dubonnet PT, DPT, CSRS 07/15/2023, 6:16 PM

## 2023-07-15 NOTE — Progress Notes (Signed)
Patient ID: Chloe Holmes, female   DOB: 03-Aug-1946, 77 y.o.   MRN: [id number]  Team Conference Report to Patient/Family  Team Conference discussion was reviewed with the patient and caregiver, including goals, any changes in plan of care and target discharge date.  Patient and caregiver express understanding and are in agreement.  The patient has a target discharge date of 07/17/23.   SW met with patient to discuss team conference updates. Patient ready for discharge on Friday. Patient will call her friend to confirm her ride on Friday between 9-11. Patient will reach out to SW if adjusted timeframe is needed. No additional questions or concerns.  Andria Rhein 07/15/2023, 1:43 PM

## 2023-07-15 NOTE — Progress Notes (Signed)
PROGRESS NOTE   Subjective/Complaints: Discussed events prior to admission including a blind spot as well as reduced strength/sensation RUE which caused her to drop her phone twice , in hospital pt had a couple episodes of multicolored pinwheels  No issues overnite  ROS: Patient denies CP, SOB, N/V/D    Objective:   No results found. Recent Labs    07/13/23 0459  WBC 4.7  HGB 12.3  HCT 37.3  PLT 196   Recent Labs    07/13/23 0459  NA 134*  K 3.9  CL 102  CO2 25  GLUCOSE 85  BUN 19  CREATININE 0.85  CALCIUM 8.7*    Intake/Output Summary (Last 24 hours) at 07/15/2023 0804 Last data filed at 07/15/2023 0744 Gross per 24 hour  Intake 734 ml  Output --  Net 734 ml        Physical Exam: Vital Signs Blood pressure 137/71, pulse 63, temperature (!) 96.3 F (35.7 C), resp. rate 17, height 5\' 5"  (1.651 m), weight 87.2 kg, SpO2 95%.  General: No acute distress Mood and affect are appropriate Heart: Regular rate and rhythm no rubs murmurs or extra sounds Lungs: Clear to auscultation, breathing unlabored, no rales or wheezes Abdomen: Positive bowel sounds, soft nontender to palpation, nondistended Extremities: No clubbing, cyanosis, or edema Skin: No evidence of breakdown, no evidence of rash Skin: + R facial bruising as above   MSK: No pain with UE or LE AROM              Neurologic exam:  Alert and oriented x 3. Normal insight and awareness. Intact Memory. Normal language and speech.  Spasticity: MAS 0 in all extremities.  Strength:        5/5 in BUE and BLE on MMT    Assessment/Plan: 1. Functional deficits which require 3+ hours per day of interdisciplinary therapy in a comprehensive inpatient rehab setting. Physiatrist is providing close team supervision and 24 hour management of active medical problems listed below. Physiatrist and rehab team continue to assess barriers to discharge/monitor patient  progress toward functional and medical goals  Care Tool:  Bathing    Body parts bathed by patient: Right arm, Left arm, Chest, Abdomen, Front perineal area, Buttocks, Right upper leg, Face, Left lower leg, Right lower leg, Left upper leg         Bathing assist Assist Level: Supervision/Verbal cueing     Upper Body Dressing/Undressing Upper body dressing   What is the patient wearing?: Pull over shirt    Upper body assist Assist Level: Set up assist    Lower Body Dressing/Undressing Lower body dressing      What is the patient wearing?: Underwear/pull up, Pants     Lower body assist Assist for lower body dressing: Set up assist     Toileting Toileting    Toileting assist Assist for toileting: Supervision/Verbal cueing     Transfers Chair/bed transfer  Transfers assist     Chair/bed transfer assist level: Supervision/Verbal cueing     Locomotion Ambulation   Ambulation assist      Assist level: Supervision/Verbal cueing Assistive device: Other (comment) (walking stick) Max distance: 150   Walk  10 feet activity   Assist     Assist level: Supervision/Verbal cueing Assistive device:  (walkig stick)   Walk 50 feet activity   Assist    Assist level: Supervision/Verbal cueing Assistive device: Other (comment) (walking stick)    Walk 150 feet activity   Assist    Assist level: Supervision/Verbal cueing (walking stick) Assistive device: No Device (walking stick)    Walk 10 feet on uneven surface  activity   Assist Walk 10 feet on uneven surfaces activity did not occur: Safety/medical concerns   Assist level: Minimal Assistance - Patient > 75% Assistive device: Other (comment) (no device.)   Wheelchair     Assist Is the patient using a wheelchair?: No             Wheelchair 50 feet with 2 turns activity    Assist            Wheelchair 150 feet activity     Assist          Blood pressure 137/71, pulse  63, temperature (!) 96.3 F (35.7 C), resp. rate 17, height 5\' 5"  (1.651 m), weight 87.2 kg, SpO2 95%.    Medical Problem List and Plan: 1. 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  Antithrombotics: -DVT/anticoagulation:  Pharmaceutical: Lovenox may d/c             -antiplatelet therapy: Aspirin and Plavix for three weeks followed by aspirin alone starting Jul 31, 2023   3. Pain Management: Tylenol, Fioricet as needed   4. Mood/Behavior/Sleep: LCSW to evaluate and provide emotional support             -antipsychotic agents: n/a   5. Neuropsych/cognition: This patient is capable of making decisions on her own behalf.   6. Skin/Wound Care: Routine skin care checks   7. Fluids/Electrolytes/Nutrition: Routine Is and Os and follow-up chemistries             -on heart healthy diet with thin liquids             -continue folic acid and MVI   8: New onset seizure:             -continue Depakote 500 mg every 8 hours- based on levels reduce to q12h             -Follow-up neurology as outpatient              - EEG 11/14 with left cortical dysfunction but otherwise unremarkable--spoke with pt regarding seizure treatment. Neurology feels she will need to be on prophylaxis life long- will need to f/u with Neuro as OP    Discussed driving post seizure .None per state low until seizure free x 6 mo, must be cleared by neuro    - 9: Hyperlipidemia: continue statin   10: Headache: Fioricet as needed   11: Visual disturbance/hallucinations: changed from Keppra to Depakote on  11/13 (repeat MRI 11/13 acute punctate infarction left parietal cortex)              -  improved  12: Orbital floor fracture OD (no entrapment): -Fredderick Phenix, MD (ophthalmology) and [facility]. Elijah Birk, MD (ENT) consulted on 11/10.  Recommend to reconsult in 1 week per hospitalist, Dr. Lowell Guitar. Right inferior rectus herniation which would result in decreased downward gaze  - do not think diplopia is related to CVA 13: IBS/C: reordered Linzess  (30-60 minutes before breakfast); patient wants control of this med, discussed with nursing and pharmacy, magnesium gluconate 250mg  ordered HS   14: History of hypertension: home Bystolic 10 mg and hydrochlorothiazide 20 mg currently held for permissive HTN              - Add PRN hydralazine 10 mg Q8H for Systolic >190, Diastolic >110  Restarted Bistolic 2.5mg  daily on 11/15--  -11/17 bp's improving except for one reading which was borderline--continue current regimen Vitals:   07/14/23 1947 07/15/23 0513  BP: (!) 142/79 137/71  Pulse: 64 63  Resp: 18 17  Temp: 98.1 F (36.7 C) (!) 96.3 F (35.7 C)  SpO2: 98% 95%    16. Suboptimal potassium: kdur 10 ordered 11/15- stable at 3.9 on 11/18    Latest Ref Rng & Units 07/13/2023    4:59 AM 07/09/2023    8:17 AM 07/08/2023    4:38 AM  BMP  Glucose 70 - 99 mg/dL 85  99  98   BUN 8 - 23 mg/dL 19  20  20    Creatinine 0.44 - 1.00 mg/dL 6.30  1.60  1.09   Sodium 135 - 145 mmol/L 134  138  138   Potassium 3.5 - 5.1 mmol/L 3.9  3.9  3.9   Chloride 98 - 111 mmol/L 102  108  108   CO2 22 - 32 mmol/L 25  23  21    Calcium 8.9 - 10.3 mg/dL 8.7  8.9  8.7    LOS: 6 days A FACE TO FACE EVALUATION WAS PERFORMED  Victorino Sparrow Raciel Caffrey 07/15/2023, 8:04 AM

## 2023-07-15 NOTE — Progress Notes (Signed)
Physical Therapy Session Note  Patient Details  Name: Chloe Holmes MRN: [id number] Date of Birth: Jul 28, 1946  Today's Date: 07/15/2023 PT Individual Time: 1114-1204 PT Individual Time Calculation (min): 50 min   Short Term Goals: Week 1:  PT Short Term Goal 1 (Week 1): STG = LTG d/t ELOS  Skilled Therapeutic Interventions/Progress Updates:  Patient supine in bed on entrance to room. On phone with plumber who is performing work on home. Patient alert and agreeable to PT session. Pt missed of skilled therapy due to personal issue. Will re-attempt as schedule and pt availability permits.   Patient with no pain complaint at start of session.  Therapeutic Activity: Bed Mobility: Pt performed supine <> sit with Mod I using bed positioning for hand assist. No vc required. . Transfers: Pt performed sit<>stand and stand pivot transfers throughout session with Mod I/ supervision. Toilet transfer performed with Mod I. Education provided re: biomechanics of body positioning for improved rise to stand without momentum build and reduced need to push with BUE.   Gait Training/ Neuromuscular Re-ed: Pt ambulated >200 ft using walking stick with supervision. Demonstrated one instance of BLE errant leg movements and noted to therapist. Does not repeat. Provided vc/ tc for focus, level gaze.  NMR facilitated during session with focus on dynamic standing balance and dynamic gait. Pt guided in ambulation through agility ladder: One step into each square out and back x4 One step in each square with high knees and slower pace out and back x6 Alternating toe tap with progression of step into next square out and back x6 Alternating step with progression into next square after controlled circling of swing LE around cone x1.   Pt with difficulty at start of each task but is able to demo improvement throughout. With continued education re: need to single leg balance over stance LE in order to place  swing LE anywhere desired with controlled movement. A few instances with min balance loss but pt able to self recover with stepping strategy and MinA. Overall completes with light CGA for safety. Does demonstrate improved performance throughout with improved control/ balance.   NMR performed for improvements in motor control and coordination, balance, sequencing, judgement, and self confidence/ efficacy in performing all aspects of mobility at highest level of independence.   Patient supine in bed at end of session with brakes locked, bed alarm set, and all needs within reach. Discussion re: most likely upgrade to Mod I in room tomorrow after discussion with OT to confirm.    Therapy Documentation Precautions:  Precautions Precautions: Fall Precaution Comments: diplopia, facial/ orbital fractures Restrictions Weight Bearing Restrictions: No  Pain:  No pain related this session.   Therapy/Group: Individual Therapy  Loel Dubonnet PT, DPT, CSRS 07/15/2023, 11:09 AM

## 2023-07-15 NOTE — Progress Notes (Signed)
Physical Therapy Session Note  Patient Details  Name: Chloe Holmes MRN: [id number] Date of Birth: 26-Oct-1945  Today's Date: 07/15/2023 PT Individual Time: 0810-0851 PT Individual Time Calculation (min): 41 min   Short Term Goals: Week 1:  PT Short Term Goal 1 (Week 1): STG = LTG d/t ELOS  Skilled Therapeutic Interventions/Progress Updates: Patient supine in bed with MD present on entrance to room. Patient alert and agreeable to PT session.   Patient reported no pain this morning.  Therapeutic Activity: Bed Mobility: Pt performed supine<>sit on EOB modI (HOB elevated). No VC required.  Transfers: Pt performed sit<>stand transfers throughout session with distant supervision and with walking stick.  Gait Training:  Pt ambulated from room<>day room gym using walking stick with supervision. When leaving room on the way to day room, pt attempted to walk without walking stick and caught front of R shoe on floor but was able to take stepping strategy to catch balance without CGA (pt reported not increasing clearance and wanted to work on that today). Pt then ambulated rest of the way with walking stick with supervision. Pt ambulated back to room with initial VC to decrease cadence and to avoid looking at the ground (pt still occasionally looked at ground for a few seconds, then back forward ahead). Noted increase in straight gait pattern vs deviated to R and L with decrease in cadence.  Neuromuscular Re-ed: NMR facilitated during session with focus on increasing B foot clearance, dynamic standing balance, and coordination. - Pt ambulated around nsg and day room loop with 5lb ankle weights donned bilaterally (2 laps) with CGA for safety. Pt with increased cues on 2nd lap to increase hip flexion to increase foot clearance. Pt required rest break. Pt ambulated around nsg and day room loop again without ankle weights with noted increase in foot clearance. Pt with cued to decrease cadence due to  some gait deviations to R and L. Pt presented with safer gait with cue. Pt also cued to decrease cadence when at home due to conformability with home environment. Pt also cued to look up in front vs down at ground due to having increase in B LE's crossing to midline (slight scissor) with pt noticing deviation (pt also educated to perform these safety measures at home). - Pt tossed bean bags to cornhole board with CGA for safety. Pt presented with unsteadiness while tossing and grabbing bag (table on R side with pt using R UE) but was able to maintain standing balance without assistance. Pt participated in 2 rounds with getting at least 75% of bags onto board and one in the hole. Pt ambulated without AD (CGA) and picked up bags from ground to place in basket to simulate picking up items on floor at home. Pt with CGA for safety but no LOB throughout. Pt cued to take time to pick up items due to having to look down at the floor.  NMR performed for improvements in motor control and coordination, balance, sequencing, judgement, and self confidence/ efficacy in performing all aspects of mobility at highest level of independence.   Patient supine in bed at end of session with brakes locked, bed alarm set, and all needs within reach.      Therapy Documentation Precautions:  Precautions Precautions: Fall Precaution Comments: diplopia, facial/ orbital fractures Restrictions Weight Bearing Restrictions: No  Therapy/Group: Individual Therapy  Clarke Peretz PTA 07/15/2023, 9:06 AM

## 2023-07-15 NOTE — Patient Care Conference (Signed)
Inpatient RehabilitationTeam Conference and Plan of Care Update Date: 07/15/2023   Time: 10:14 AM    Patient Name: Chloe Holmes      Medical Record Number: [id number]  Date of Birth: June 23, 1946 Sex: Female         Room/Bed: 4M02C/4M02C-01 Payor Info: Payor: MEDICARE / Plan: MEDICARE PART A AND B / Product Type: *No Product type* /    Admit Date/Time:  07/09/2023  3:34 PM  Primary Diagnosis:  Seizure disorder [facility])  Hospital Problems: Principal Problem:   Seizure disorder [facility])    Expected Discharge Date: Expected Discharge Date: 07/17/23  Team Members Present: Physician leading conference: Dr. Claudette Laws Social Worker Present: Lavera Guise, BSW Nurse Present: Chana Bode, RN PT Present: Ralph Leyden, PT OT Present: Primitivo Gauze, OT SLP Present: Everardo Pacific, SLP PPS Coordinator present : Fae Pippin, SLP     Current Status/Progress Goal Weekly Team Focus  Bowel/Bladder   Pt is continent of b/b. LBM: 11/19   Remain continent of b/b.   Assist w/ toileting needs as needed.    Swallow/Nutrition/ Hydration               ADL's   distant supervision to set up with all self care and ADL transfers   Mod ind   general strengthening and activity tolerance,   pt education, ADLs/ IADLs    Mobility   Bed mobility - ModI; Transfer - supervision/ Mod I with walking stick; Gait - supervision with walking stick  (gait deviations to R and L with mild unsteadiness that decreases when looking forward ahead vs down at ground)   supervision/ ModI  Barriers - diplopia improving daily, balance; Weekly Focus - safe d/c preparation, therex, functional mobility (picking items up from floor), multi-directional gait training, balance    Communication                Safety/Cognition/ Behavioral Observations               Pain   Pt denies pain.   Remain pain free.   Assess pain q shift & PRN.    Skin   Bruising to R side of face. Bruising to  bilat. arms/ABD   Maintain skin integrity.  Assess skin q shift & PRN.      Discharge Planning:  Son primary contact. Discharging home with support and supervision from family and friends. 1 level home, 1 step. D/C on Friday!   Team Discussion: Patient post Seizure with history of HTN.   Patient on target to meet rehab goals: yes, goals for discharge set for mod I overall.  *See Care Plan and progress notes for long and short-term goals.   Revisions to Treatment Plan:  N/a  Teaching Needs: Safety, medications, dietary modification, transfers, etc.   Current Barriers to Discharge: Lack of/limited family support  Possible Resolutions to Barriers: No DME No follow up recommended     Medical Summary Current Status: Double vision with downward gaze only, still has balance issues.  Barriers to Discharge: Other (comments);Self-care education  Barriers to Discharge Comments: Safety awareness Possible Resolutions to Barriers/Weekly Focus: Education regarding seizure disorder and medications.  Appreciate ENT consult   Continued Need for Acute Rehabilitation Level of Care: The patient requires daily medical management by a physician with specialized training in physical medicine and rehabilitation for the following reasons: Direction of a multidisciplinary physical rehabilitation program to maximize functional independence : Yes Medical management of patient stability for increased activity during participation in  an intensive rehabilitation regime.: Yes Analysis of laboratory values and/or radiology reports with any subsequent need for medication adjustment and/or medical intervention. : Yes   I attest that I was present, lead the team conference, and concur with the assessment and plan of the team.   Chana Bode B 07/15/2023, 4:13 PM

## 2023-07-16 ENCOUNTER — Other Ambulatory Visit (HOSPITAL_COMMUNITY): Payer: Self-pay

## 2023-07-16 DIAGNOSIS — G40909 Epilepsy, unspecified, not intractable, without status epilepticus: Secondary | ICD-10-CM | POA: Diagnosis not present

## 2023-07-16 DIAGNOSIS — I6389 Other cerebral infarction: Secondary | ICD-10-CM | POA: Diagnosis not present

## 2023-07-16 DIAGNOSIS — S0231XS Fracture of orbital floor, right side, sequela: Secondary | ICD-10-CM | POA: Diagnosis not present

## 2023-07-16 DIAGNOSIS — M48062 Spinal stenosis, lumbar region with neurogenic claudication: Secondary | ICD-10-CM | POA: Diagnosis not present

## 2023-07-16 MED ORDER — CLOPIDOGREL BISULFATE 75 MG PO TABS
75.0000 mg | ORAL_TABLET | Freq: Every day | ORAL | 0 refills | Status: AC
  Filled 2023-07-16: qty 12, 12d supply, fill #0

## 2023-07-16 MED ORDER — ATORVASTATIN CALCIUM 20 MG PO TABS
20.0000 mg | ORAL_TABLET | Freq: Every day | ORAL | 0 refills | Status: DC
  Filled 2023-07-16: qty 30, 30d supply, fill #0

## 2023-07-16 MED ORDER — NEBIVOLOL HCL 5 MG PO TABS
5.0000 mg | ORAL_TABLET | Freq: Every day | ORAL | 0 refills | Status: DC
  Filled 2023-07-16: qty 30, 30d supply, fill #0

## 2023-07-16 MED ORDER — ASPIRIN 81 MG PO TBEC
81.0000 mg | DELAYED_RELEASE_TABLET | Freq: Every day | ORAL | 0 refills | Status: AC
  Filled 2023-07-16: qty 30, 30d supply, fill #0

## 2023-07-16 MED ORDER — DIVALPROEX SODIUM 500 MG PO DR TAB
500.0000 mg | DELAYED_RELEASE_TABLET | Freq: Two times a day (BID) | ORAL | 0 refills | Status: DC
  Filled 2023-07-16: qty 60, 30d supply, fill #0

## 2023-07-16 MED ORDER — ACETAMINOPHEN 325 MG PO TABS: 325.0000 mg | ORAL_TABLET | ORAL | Status: AC | PRN

## 2023-07-16 NOTE — Progress Notes (Signed)
Physical Therapy Discharge Summary  Patient Details  Name: Chloe Holmes MRN: [id number] Date of Birth: February 26, 1946  Date of Discharge from PT service:July 16, 2023  Patient has met 9 of 9 long term goals due to improved activity tolerance, improved balance, increased strength, ability to compensate for deficits, improved awareness, and improved coordination.  Patient to discharge at an ambulatory level Modified Independent.   Patient's care partner is independent to provide the necessary physical assistance at discharge.  Reasons goals not met: n/a  Recommendation:  Patient will benefit from ongoing skilled PT services in outpatient setting to continue to advance safe functional mobility, address ongoing impairments in strength, coordination, balance, activity tolerance, cognition, safety awareness, and to minimize fall risk.  Equipment: No equipment provided  Reasons for discharge: treatment goals met and discharge from hospital  Patient/family agrees with progress made and goals achieved: Yes  PT Discharge Precautions/Restrictions Precautions Precautions: Fall Precaution Comments: Diplopia that has improved since evaluation Restrictions Weight Bearing Restrictions: No  Pain Pain Assessment Pain Scale: 0-10 Pain Score: 0-No pain Pain Interference Pain Interference Pain Effect on Sleep: 1. Rarely or not at all Pain Interference with Therapy Activities: 1. Rarely or not at all Pain Interference with Day-to-Day Activities: 1. Rarely or not at all Vision/Perception  Vision - History Ability to See in Adequate Light: 0 Adequate  Cognition Overall Cognitive Status: Within Functional Limits for tasks assessed Arousal/Alertness: Awake/alert Orientation Level: Oriented X4 Sensation Sensation Light Touch: Appears Intact Hot/Cold: Appears Intact Proprioception: Appears Intact Stereognosis: Appears Intact Coordination Gross Motor Movements are Fluid and Coordinated:  No Fine Motor Movements are Fluid and Coordinated: Yes Finger Nose Finger Test: [facility] Heel Shin Test: [facility] Motor  Motor Motor: Other (comment) Motor - Discharge Observations: Decreased B LE coordination with dynamic balance (has improved since evaluation)  Mobility Bed Mobility Bed Mobility: Rolling Right;Supine to Sit;Sit to Supine;Rolling Left;Sitting - Scoot to Edge of Bed Rolling Right: Independent Rolling Left: Independent Supine to Sit: Independent Sitting - Scoot to Edge of Bed: Independent Sit to Supine: Independent Transfers Transfers: Sit to Stand;Stand to [employer] Transfers Sit to Stand: Independent Stand to Sit: Independent Stand Pivot Transfers: Independent with assistive device Transfer (Assistive device): Other (Comment) (walking stick) Locomotion  Gait Ambulation: Yes Gait Assistance: Supervision/Verbal cueing Gait Distance (Feet): 200 Feet Assistive device: Other (Comment) (walking stick) Gait Assistance Details: Verbal cues for precautions/safety (VC to look forward ahead vs at the ground) Gait Gait: Yes Gait Pattern: Impaired Gait Pattern: Poor foot clearance - left;Poor foot clearance - right;Scissoring;Step-through pattern (mild scissoring that decreases when not looking at the ground and cadence decrease) Gait velocity: Increased since evaluation but given VC to decrease to maintain safe dynamic standing balance Stairs / Additional Locomotion Stairs: Yes Stairs Assistance: Supervision/Verbal cueing Stair Management Technique: Two rails (plus 4 steps with no handrails and supervision) Number of Stairs: 8 Height of Stairs: 6 Wheelchair Mobility Wheelchair Mobility: No  Trunk/Postural Assessment  Cervical Assessment Cervical Assessment: Within Functional Limits Thoracic Assessment Thoracic Assessment: Within Functional Limits Lumbar Assessment Lumbar Assessment: Within Functional Limits Postural Control Postural Control: Within Functional  Limits  Balance Balance Balance Assessed: Yes Standardized Balance Assessment Standardized Balance Assessment: Berg Balance Test Berg Balance Test Sit to Stand: Able to stand without using hands and stabilize independently Standing Unsupported: Able to stand safely 2 minutes Sitting with Back Unsupported but Feet Supported on Floor or Stool: Able to sit safely and securely 2 minutes Stand to Sit: Sits safely with minimal use  of hands Transfers: Able to transfer safely, minor use of hands Standing Unsupported with Eyes Closed: Able to stand 10 seconds with supervision Standing Ubsupported with Feet Together: Able to place feet together independently and stand 1 minute safely From Standing, Reach Forward with Outstretched Arm: Can reach confidently >25 cm (10") From Standing Position, Pick up Object from Floor: Able to pick up shoe safely and easily From Standing Position, Turn to Look Behind Over each Shoulder: Looks behind from both sides and weight shifts well Turn 360 Degrees: Able to turn 360 degrees safely one side only in 4 seconds or less Standing Unsupported, Alternately Place Feet on Step/Stool: Able to stand independently and complete 8 steps >20 seconds Standing Unsupported, One Foot in Front: Able to plae foot ahead of the other independently and hold 30 seconds Standing on One Leg: Able to lift leg independently and hold equal to or more than 3 seconds Total Score: 50 Dynamic Sitting Balance Dynamic Sitting - Level of Assistance: 7: Independent Static Standing Balance Static Standing - Level of Assistance: 7: Independent Dynamic Standing Balance Dynamic Standing - Level of Assistance: 5: Stand by assistance Extremity Assessment      RLE Assessment RLE Assessment: Within Functional Limits LLE Assessment LLE Assessment: Within Functional Limits   Dominic A Sandoval 07/16/2023, 10:35 AM  Loel Dubonnet PT, DPT, CSRS 07/17/2023, 11:25 AM

## 2023-07-16 NOTE — Progress Notes (Signed)
Patient ID: Chloe Holmes, female   DOB: 17-Apr-1946, 77 y.o.   MRN: [id number]  OP orders sent to [facility] for  PT follow up

## 2023-07-16 NOTE — Plan of Care (Signed)
  Problem: Consults Goal: RH STROKE PATIENT EDUCATION Description: See Patient Education module for education specifics  Outcome: Progressing   Problem: RH SAFETY Goal: RH STG ADHERE TO SAFETY PRECAUTIONS W/ASSISTANCE/DEVICE Description: STG Adhere to Safety Precautions With cues Assistance/Device. Outcome: Progressing   Problem: RH KNOWLEDGE DEFICIT Goal: RH STG INCREASE KNOWLEDGE OF HYPERTENSION Description: Patient will be able to manage HTN with medications and dietary modification using educational resources independently Outcome: Progressing Goal: RH STG INCREASE KNOWLEGDE OF HYPERLIPIDEMIA Description: Patient will be able to manage HLD with medications and dietary modification using educational resources independently Outcome: Progressing Goal: RH STG INCREASE KNOWLEDGE OF STROKE PROPHYLAXIS Description: Patient will be able to manage secondary risks with medications and dietary modification using educational resources independently Outcome: Progressing

## 2023-07-16 NOTE — Progress Notes (Signed)
Physical Therapy Session Note  Patient Details  Name: Chloe Holmes MRN: [id number] Date of Birth: Apr 04, 1946  Today's Date: 07/16/2023 PT Individual Time: 7829-5621; 1307 - 1417 PT Individual Time Calculation (min): 70 min; 70 min   Short Term Goals: Week 1:  PT Short Term Goal 1 (Week 1): STG = LTG d/t ELOS  SESSION 1 Skilled Therapeutic Interventions/Progress Updates: Patient supine in bed on entrance to room. Patient alert and agreeable to PT session.   Patient reported no pain at beginning of session. Pt made modI today! Outcome measure of BERG updated this session.  Therapeutic Activity: Bed Mobility: Pt performed supine<>sit on EOB independently Transfers: Pt performed sit<>stand transfers throughout session with modI.  - Patient demonstrates increased fall risk as noted by score of  50/56 on Berg Balance Scale.  (<36= high risk for falls, close to 100%; 37-45 significant >80%; 46-51 moderate >50%; 52-55 lower >25%)  Gait Training:  Pt ambulated from room<>day room gym using walking stick with supervision. Pt with unsteadiness throughout with some scissoring in B LE's (increases when looking at ground - pt knows cue to look up but correlates with prior habit).  Stair Navigation: - Ascending 4 (6") steps with use of B HR, and with supervision. Pt with reciprocal pattern. X 3 rounds - Descending 4 (6") steps with use of B HR, and with supervision. Pt with reciprocal pattern. Pt R knee "popped" a few times at first with slight pain like it was going to give per pt report. Pt cued to descend again with step to pattern with noted increase in stability and confidence. Pt then performed 3rd round to reassess with reciprocal pattern and increased confidence after no pop sounds in R knee or it feeling like it was going to give way. Pt provided with VC to touch heel with previous step when descending to ensure entire foot is on step. Pt preformed 4th round with VC to avoid using B HR  per home step at home (single), but pt navigated 4 steps (ascending/descending) with no hand rails and with supervision and no LOB and with step to pattern (descending) and reciprocal (ascending).  Therapeutic Exercise: Pt performed the following exercises with therapist providing the described cuing and facilitation for improvement. - NuStep 20 min, 3.1 Mets high, 85-95 steps per minute at beginning of PT session in order to loosen up B LE's in the morning.   Patient sitting EOB at end of session with brakes locked and all needs in reach.   SESSION 2 Skilled Therapeutic Interventions/Progress Updates: Patient supine in bed on entrance to room. Patient alert and agreeable to PT session.   Patient reported no pain.  At end of session PTA asked pt to recall safety measures learned here at inpatient stay prior to d/c. Pt recalled: Ambulating around house with shoes on, caution of slippery surfaces such as bathroom and shower, decrease cadence and looking ahead vs at ground. Pt also iterated importance of dynamic balance when walking, and to ensure tripping hazards are not around the house such as rolled up rugs.   Access Code: [email or web address] URL: [email or web address] Date: 07/16/2023 Prepared by: Bonnita Hollow  Exercises - Sit to Stand with Arms Crossed  - 1 x daily - 7 x weekly - 3 sets - 15 reps - Side Step Overs with Cones and Counter Support  - 1 x daily - 7 x weekly - 3 sets - 5 reps (pt performed x 4 reps with hi/low mat elevated high to simulate  countertop at end of session to ensure compliance to sequence. Pt with demonstrative cues to step laterally with toes pointed forward in upright standing position vs forward flexed. Pt also cued to take bigger steps laterally to ensure space left to advance other extremity. Pt with CGA throughout).   Therapeutic Activity: Bed Mobility: Pt performed supine<>sit on EOB independently Transfers: Pt performed sit<>stand transfers  throughout session independently with walking stick and without. Pt ambulated short distance from EOB to bathroom to void bladder with closed door supervision for privacy and no AD. Pt performed self hygiene and ambulated to sink to wash hands (also without AD).   Gait Training:  Pt ambulated from room<N tower<ortho gym (300' +)using walking stick with supervision. Pt provided with reminder VC to look forward ahead and to decrease cadence as it increases dynamic gait balance. Pt required a rest break after getting to ortho gym. Pt ambulated from ortho gym to main gym stairwell to navigate one flight of stairs.  Stair Navigation: - Ascending 11 (7.5") steps with use of R HR and walking stick in L UE, and with close supervision for safety. Pt with reciprocal pattern. - Descending 11 (7.5") steps with use of R HR, and with CGA that progressed to close supervision for safety. Pt with step-to pattern and VC to touch back of heel to previous step to ensure entire foot is on step. Pt also cued to step down with R LE first due to it having more pain in knee than L, then walking stick (in L UE), then the L LE per pt initially descending with walking stick first (pt educated to avoid forward weight shifting in that manner when descending to avoid falling.   Pt then ambulated back to ortho gym with supervision to transfer to car simulator. Pt reported that pt's friend drives a Lexus SUV. PTA elevated up to 24.5" (top of bottom threshold) to simulate. Pt performed task with supervision for safety with no issue.   Therapeutic Exercise: Pt performed the following exercises with therapist providing the described cuing and facilitation for improvement. - NuStep 12:34 on level 7 with 809 steps at end of session to increase/maintain cardiovascular endurance (and pt request). Pt adjusted seat and mechanics of machine to fit as pt has one at home without assistance.   Patient supine in bed at end of session with brakes  locked and all needs in reach with no further questions.        Therapy Documentation Precautions:  Precautions Precautions: Fall Precaution Comments: Diplopia that has improved since evaluation Restrictions Weight Bearing Restrictions: No  Therapy/Group: Individual Therapy  Evelen Vazguez PTA 07/16/2023, 10:33 AM

## 2023-07-16 NOTE — Progress Notes (Signed)
Inpatient Rehabilitation Care Coordinator Discharge Note   Patient Details  Name: Chloe Holmes MRN: [id number] Date of Birth: 1946-05-26   Discharge location: Home  Length of Stay: 7 Days  Discharge activity level: MOD I  Home/community participation: Son and friends  Patient response WU:[id number] Literacy - How often do you need to have someone help you when you read instructions, pamphlets, or other written material from your doctor or pharmacy?: Rarely  Patient response YN:WGNFAO Isolation - How often do you feel lonely or isolated from those around you?: Never  Services provided included: MD, RD, PT, OT, SLP, RN, CM, TR, Pharmacy, Neuro[facility], SW  Financial Services:  Financial Services Utilized: Medicare    Choices offered to/list presented to: patient  Follow-up services arranged:  Outpatient Home Health Agency: n/a  Outpatient Servicies: [facility] PT OT      Patient response to transportation need: Is the patient able to respond to transportation needs?: Yes In the past 12 months, has lack of transportation kept you from medical appointments or from getting medications?: No In the past 12 months, has lack of transportation kept you from meetings, work, or from getting things needed for daily living?: No   Patient/Family verbalized understanding of follow-up arrangements:  Yes  Individual responsible for coordination of the follow-up plan: self  Confirmed correct DME delivered: Andria Rhein 07/16/2023    Comments (or additional information):  Summary of Stay    Date/Time Discharge Planning CSW  07/14/23 1537 Son primary contact. Discharging home with support and supervision from family and friends. 1 level home, 1 step. D/C on Friday! CJB       Andria Rhein

## 2023-07-16 NOTE — Plan of Care (Signed)
  Problem: RH Balance Goal: LTG Patient will maintain dynamic standing with ADLs (OT) Description: LTG:  Patient will maintain dynamic standing balance with assist during activities of daily living (OT)  Outcome: Completed/Met   Problem: Sit to Stand Goal: LTG:  Patient will perform sit to stand in prep for activites of daily living with assistance level (OT) Description: LTG:  Patient will perform sit to stand in prep for activites of daily living with assistance level (OT) Outcome: Completed/Met   Problem: RH Bathing Goal: LTG Patient will bathe all body parts with assist levels (OT) Description: LTG: Patient will bathe all body parts with assist levels (OT) Outcome: Completed/Met   Problem: RH Dressing Goal: LTG Patient will perform upper body dressing (OT) Description: LTG Patient will perform upper body dressing with assist, with/without cues (OT). Outcome: Completed/Met Goal: LTG Patient will perform lower body dressing w/assist (OT) Description: LTG: Patient will perform lower body dressing with assist, with/without cues in positioning using equipment (OT) Outcome: Completed/Met   Problem: RH Toileting Goal: LTG Patient will perform toileting task (3/3 steps) with assistance level (OT) Description: LTG: Patient will perform toileting task (3/3 steps) with assistance level (OT)  Outcome: Completed/Met   Problem: RH Simple Meal Prep Goal: LTG Patient will perform simple meal prep w/assist (OT) Description: LTG: Patient will perform simple meal prep with assistance, with/without cues (OT). Outcome: Completed/Met   Problem: RH Laundry Goal: LTG Patient will perform laundry w/assist, cues (OT) Description: LTG: Patient will perform laundry with assistance, with/without cues (OT). Outcome: Completed/Met   Problem: RH Light Housekeeping Goal: LTG Patient will perform light housekeeping w/assist (OT) Description: LTG: Patient will perform light housekeeping with assistance,  with/without cues (OT). Outcome: Completed/Met   Problem: RH Toilet Transfers Goal: LTG Patient will perform toilet transfers w/assist (OT) Description: LTG: Patient will perform toilet transfers with assist, with/without cues using equipment (OT) Outcome: Completed/Met   Problem: RH Tub/Shower Transfers Goal: LTG Patient will perform tub/shower transfers w/assist (OT) Description: LTG: Patient will perform tub/shower transfers with assist, with/without cues using equipment (OT) Outcome: Completed/Met

## 2023-07-16 NOTE — Progress Notes (Signed)
Occupational Therapy Discharge Summary  Patient Details  Name: Chloe Holmes MRN: [id number] Date of Birth: [birth year]  Date of Discharge from OT service:July 16, 2023  Today's Date: 07/16/2023 OT Individual Time: 1105-1200  Pt seen this session to engage in a fun dance group activity to address overall activity tolerance, balance and LE coordination. Pt ambulated from room to day room over a 1000 ft with walking stick with no assist.  Pt reports she no longer has diplopia.   Pt engaged in the class following choreography from Dietitian. Pt spend 80% of class in sitting and 20% in standing following the steps.  Pt had no LOB as she worked on lateral side steps and side taps. Pt ambulated back to her room. She stated she is prepared to discharge home tomorrow.    Patient has met 11 of 11 long term goals due to improved activity tolerance, improved balance, ability to compensate for deficits, and improved coordination.  Patient to discharge at overall Modified Independent level.  Patient's care partner is independent to provide the necessary physical assistance at discharge with driving and running errands.  Reasons goals not met: n/a  Recommendation:  No furhter OT services needed at this time.  Equipment: No equipment provided  Reasons for discharge: treatment goals met  Patient/family agrees with progress made and goals achieved: Yes  OT Discharge Precautions/Restrictions  Precautions Precautions: Fall Precaution Comments: Diplopia that has improved since evaluation Restrictions Weight Bearing Restrictions: No   Pain Pain Assessment Pain Scale: 0-10 Pain Score: 0-No pain ADL ADL ADL Comments: independent to modified independent with all ADLS/ basic IADLS (simple meal prep, simple housekeeping, laundry) Vision Baseline Vision/History: 1 Wears glasses Patient Visual Report: Other (comment) (pt reports today that she no longer has diplopia) Eye Alignment:  Within Functional Limits Ocular Range of Motion: Within Functional Limits Tracking/Visual Pursuits: Able to track stimulus in all quads without difficulty Saccades: Within functional limits Convergence: Within functional limits Visual Fields: No apparent deficits Diplopia Assessment: Other (comment) (pt reports her diplopia has resolved) Perception  Perception: Within Functional Limits Praxis Praxis: WFL Cognition Cognition Overall Cognitive Status: Within Functional Limits for tasks assessed Arousal/Alertness: Awake/alert Brief Interview for Mental Status (BIMS) Repetition of Three Words (First Attempt): 3 Temporal Orientation: Year: Correct Temporal Orientation: Month: Accurate within 5 days Temporal Orientation: Day: Correct Recall: "Sock": Yes, no cue required Recall: "Blue": Yes, no cue required Recall: "Bed": Yes, no cue required BIMS Summary Score: 15 Sensation Sensation Light Touch: Appears Intact Hot/Cold: Appears Intact Proprioception: Appears Intact Stereognosis: Appears Intact Coordination Gross Motor Movements are Fluid and Coordinated: No Fine Motor Movements are Fluid and Coordinated: Yes Finger Nose Finger Test: [facility] [place] Heel Shin Test: [facility] Motor  Motor Motor: Other (comment) Motor - Discharge Observations: Decreased B LE coordination with dynamic balance (has improved since evaluation) Mobility  Bed Mobility Bed Mobility: Rolling Right;Supine to Sit;Sit to Supine;Rolling Left;Sitting - Scoot to Edge of Bed Rolling Right: Independent Rolling Left: Independent Supine to Sit: Independent Sitting - Scoot to Edge of Bed: Independent Sit to Supine: Independent Transfers Sit to Stand: Independent Stand to Sit: Independent  Trunk/Postural Assessment  Cervical Assessment Cervical Assessment: Within Functional Limits Thoracic Assessment Thoracic Assessment: Within Functional Limits Lumbar Assessment Lumbar Assessment: Within Functional Limits Postural  Control Postural Control: Within Functional Limits  Balance Balance Balance Assessed: Yes Standardized Balance Assessment Standardized Balance Assessment: Berg Balance Test Berg Balance Test Sit to Stand: Able to stand without using hands and stabilize independently Standing Unsupported:  Able to stand safely 2 minutes Sitting with Back Unsupported but Feet Supported on Floor or Stool: Able to sit safely and securely 2 minutes Stand to Sit: Sits safely with minimal use of hands Transfers: Able to transfer safely, minor use of hands Standing Unsupported with Eyes Closed: Able to stand 10 seconds with supervision Standing Ubsupported with Feet Together: Able to place feet together independently and stand 1 minute safely From Standing, Reach Forward with Outstretched Arm: Can reach confidently >25 cm (10") From Standing Position, Pick up Object from Floor: Able to pick up shoe safely and easily From Standing Position, Turn to Look Behind Over each Shoulder: Looks behind from both sides and weight shifts well Turn 360 Degrees: Able to turn 360 degrees safely one side only in 4 seconds or less Standing Unsupported, Alternately Place Feet on Step/Stool: Able to stand independently and complete 8 steps >20 seconds Standing Unsupported, One Foot in Front: Able to plae foot ahead of the other independently and hold 30 seconds Standing on One Leg: Able to lift leg independently and hold equal to or more than 3 seconds Total Score: 50 Dynamic Sitting Balance Dynamic Sitting - Level of Assistance: 7: Independent Static Standing Balance Static Standing - Level of Assistance: 7: Independent Dynamic Standing Balance Dynamic Standing - Level of Assistance: 6: Modified independent (Device/Increase time) Extremity/Trunk Assessment RUE Assessment RUE Assessment: Within Functional Limits LUE Assessment LUE Assessment: Within Functional Limits   Chloe Holmes 07/16/2023, 1:16 PM

## 2023-07-16 NOTE — Progress Notes (Signed)
PROGRESS NOTE   Subjective/Complaints:  Discussed bystolic dose , d/c process , need to f/u with neuro regarding seizure ,  CVA  ROS: Patient denies CP, SOB, N/V/D    Objective:   No results found. No results for input(s): "WBC", "HGB", "HCT", "PLT" in the last 72 hours.  No results for input(s): "NA", "K", "CL", "CO2", "GLUCOSE", "BUN", "CREATININE", "CALCIUM" in the last 72 hours.   Intake/Output Summary (Last 24 hours) at 07/16/2023 0801 Last data filed at 07/15/2023 1800 Gross per 24 hour  Intake 355 ml  Output --  Net 355 ml        Physical Exam: Vital Signs Blood pressure 136/75, pulse 61, temperature 98.3 F (36.8 C), temperature source Oral, resp. rate 16, height 5\' 5"  (1.651 m), weight 87.2 kg, SpO2 99%.  General: No acute distress Mood and affect are appropriate Heart: Regular rate and rhythm no rubs murmurs or extra sounds Lungs: Clear to auscultation, breathing unlabored, no rales or wheezes Abdomen: Positive bowel sounds, soft nontender to palpation, nondistended Extremities: No clubbing, cyanosis, or edema Skin: No evidence of breakdown, no evidence of rash Skin: + R facial bruising as above   MSK: No pain with UE or LE AROM              Neurologic exam:  Alert and oriented x 3. Normal insight and awareness. Intact Memory. Normal language and speech.  Spasticity: MAS 0 in all extremities.  Strength:        5/5 in BUE and BLE on MMT    Assessment/Plan: 1. Functional deficits which require 3+ hours per day of interdisciplinary therapy in a comprehensive inpatient rehab setting. Physiatrist is providing close team supervision and 24 hour management of active medical problems listed below. Physiatrist and rehab team continue to assess barriers to discharge/monitor patient progress toward functional and medical goals  Care Tool:  Bathing    Body parts bathed by patient: Right arm, Left arm,  Chest, Abdomen, Front perineal area, Buttocks, Right upper leg, Face, Left lower leg, Right lower leg, Left upper leg         Bathing assist Assist Level: Supervision/Verbal cueing     Upper Body Dressing/Undressing Upper body dressing   What is the patient wearing?: Pull over shirt    Upper body assist Assist Level: Set up assist    Lower Body Dressing/Undressing Lower body dressing      What is the patient wearing?: Underwear/pull up, Pants     Lower body assist Assist for lower body dressing: Set up assist     Toileting Toileting    Toileting assist Assist for toileting: Supervision/Verbal cueing     Transfers Chair/bed transfer  Transfers assist     Chair/bed transfer assist level: Supervision/Verbal cueing     Locomotion Ambulation   Ambulation assist      Assist level: Supervision/Verbal cueing Assistive device: Other (comment) (walking stick) Max distance: 150   Walk 10 feet activity   Assist     Assist level: Supervision/Verbal cueing Assistive device:  (walkig stick)   Walk 50 feet activity   Assist    Assist level: Supervision/Verbal cueing Assistive device: Other (comment) (walking  stick)    Walk 150 feet activity   Assist    Assist level: Supervision/Verbal cueing (walking stick) Assistive device: No Device (walking stick)    Walk 10 feet on uneven surface  activity   Assist Walk 10 feet on uneven surfaces activity did not occur: Safety/medical concerns   Assist level: Minimal Assistance - Patient > 75% Assistive device: Other (comment) (no device.)   Wheelchair     Assist Is the patient using a wheelchair?: No             Wheelchair 50 feet with 2 turns activity    Assist            Wheelchair 150 feet activity     Assist          Blood pressure 136/75, pulse 61, temperature 98.3 F (36.8 C), temperature source Oral, resp. rate 16, height 5\' 5"  (1.651 m), weight 87.2 kg, SpO2  99%.    Medical Problem List and Plan: 1. 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  Antithrombotics: -DVT/anticoagulation:  Pharmaceutical: Lovenox may d/c             -antiplatelet therapy: Aspirin and Plavix for three weeks followed by aspirin alone starting Jul 31, 2023   3. Pain Management: Tylenol, Fioricet as needed   4. Mood/Behavior/Sleep: LCSW to evaluate and provide emotional support             -antipsychotic agents: n/a   5. Neuropsych/cognition: This patient is capable of making decisions on her own behalf.   6. Skin/Wound Care: Routine skin care checks   7. Fluids/Electrolytes/Nutrition: Routine Is and Os and follow-up chemistries             -on heart healthy diet with thin liquids             -continue folic acid and MVI   8: New onset seizure:             -continue Depakote 500 mg every 8 hours- based on levels reduce to q12h             -Follow-up neurology as outpatient              - EEG 11/14 with left cortical dysfunction but otherwise unremarkable--spoke with pt regarding seizure treatment. Neurology feels she will need to be on prophylaxis life long- will need to f/u with Neuro as OP    Discussed driving post seizure .None per state low until seizure free x 6 mo, must be cleared by neuro    - 9: Hyperlipidemia: continue statin   10: Headache: Fioricet as needed   11: Visual disturbance/hallucinations: changed from Keppra to Depakote on 11/13 (repeat MRI 11/13 acute punctate infarction left parietal cortex)              - improved  12: Orbital floor fracture OD (no entrapment): -Fredderick Phenix, MD (ophthalmology) and Nelva Nay. Elijah Birk, MD (ENT) consulted on 11/10.  Recommend to reconsult in 1 week per hospitalist,  Dr. Lowell Guitar. Right inferior rectus herniation  which would result in decreased downward gaze  - do not think diplopia is related to CVA 13: IBS/C: reordered Linzess  (30-60 minutes before breakfast); patient wants control of this med, discussed with nursing and pharmacy, magnesium gluconate 250mg  ordered HS   14: History of hypertension: home Bystolic 10 mg and hydrochlorothiazide 20 mg currently held for permissive HTN              - Add PRN hydralazine 10 mg Q8H for Systolic >190, Diastolic >110- not needed   Restarted Bistolic 2.5mg  daily on 11/15--increased to 5mg  11/20   -11/17 bp's improving except for one reading which was borderline--continue current regimen Vitals:   07/15/23 1941 07/16/23 0527  BP: (!) 163/82 136/75  Pulse: 64 61  Resp: 18 16  Temp: 98.1 F (36.7 C) 98.3 F (36.8 C)  SpO2: 97% 99%    16. Suboptimal potassium: kdur 10 ordered 11/15- stable at 3.9 on 11/18, no need to recheck prior to d/c- has not  needed hydrochlorothiazide thus far , f/u Dr Timothy Lasso     Latest Ref Rng & Units 07/13/2023    4:59 AM 07/09/2023    8:17 AM 07/08/2023    4:38 AM  BMP  Glucose 70 - 99 mg/dL 85  99  98   BUN 8 - 23 mg/dL 19  20  20    Creatinine 0.44 - 1.00 mg/dL 1.19  1.47  8.29   Sodium 135 - 145 mmol/L 134  138  138   Potassium 3.5 - 5.1 mmol/L 3.9  3.9  3.9   Chloride 98 - 111 mmol/L 102  108  108   CO2 22 - 32 mmol/L 25  23  21    Calcium 8.9 - 10.3 mg/dL 8.7  8.9  8.7    LOS: 7 days A FACE TO FACE EVALUATION WAS PERFORMED  Victorino Sparrow Quavion Boule 07/16/2023, 8:01 AM

## 2023-07-17 ENCOUNTER — Emergency Department (HOSPITAL_COMMUNITY): Payer: Medicare Other

## 2023-07-17 ENCOUNTER — Encounter (HOSPITAL_COMMUNITY): Payer: Self-pay

## 2023-07-17 ENCOUNTER — Other Ambulatory Visit (HOSPITAL_COMMUNITY): Payer: Self-pay

## 2023-07-17 ENCOUNTER — Emergency Department (HOSPITAL_COMMUNITY)
Admission: EM | Admit: 2023-07-17 | Discharge: 2023-07-17 | Disposition: A | Discharge status: Home / Self Care | Payer: Medicare Other | Attending: Emergency Medicine | Admitting: Emergency Medicine

## 2023-07-17 ENCOUNTER — Other Ambulatory Visit: Payer: Self-pay

## 2023-07-17 DIAGNOSIS — R002 Palpitations: Secondary | ICD-10-CM | POA: Insufficient documentation

## 2023-07-17 DIAGNOSIS — Z7982 Long term (current) use of aspirin: Secondary | ICD-10-CM | POA: Insufficient documentation

## 2023-07-17 DIAGNOSIS — R269 Unspecified abnormalities of gait and mobility: Secondary | ICD-10-CM | POA: Diagnosis not present

## 2023-07-17 DIAGNOSIS — T50995A Adverse effect of other drugs, medicaments and biological substances, initial encounter: Secondary | ICD-10-CM | POA: Diagnosis not present

## 2023-07-17 DIAGNOSIS — T50905A Adverse effect of unspecified drugs, medicaments and biological substances, initial encounter: Secondary | ICD-10-CM | POA: Diagnosis not present

## 2023-07-17 DIAGNOSIS — G40909 Epilepsy, unspecified, not intractable, without status epilepticus: Secondary | ICD-10-CM | POA: Diagnosis not present

## 2023-07-17 DIAGNOSIS — Z8673 Personal history of transient ischemic attack (TIA), and cerebral infarction without residual deficits: Secondary | ICD-10-CM | POA: Diagnosis not present

## 2023-07-17 DIAGNOSIS — R0602 Shortness of breath: Secondary | ICD-10-CM | POA: Diagnosis not present

## 2023-07-17 DIAGNOSIS — I10 Essential (primary) hypertension: Secondary | ICD-10-CM | POA: Diagnosis not present

## 2023-07-17 DIAGNOSIS — I517 Cardiomegaly: Secondary | ICD-10-CM | POA: Diagnosis not present

## 2023-07-17 DIAGNOSIS — Z79899 Other long term (current) drug therapy: Secondary | ICD-10-CM | POA: Insufficient documentation

## 2023-07-17 DIAGNOSIS — Z7902 Long term (current) use of antithrombotics/antiplatelets: Secondary | ICD-10-CM | POA: Insufficient documentation

## 2023-07-17 LAB — URINALYSIS, ROUTINE W REFLEX MICROSCOPIC
Bilirubin Urine: NEGATIVE
Glucose, UA: NEGATIVE mg/dL
Hgb urine dipstick: NEGATIVE
Ketones, ur: NEGATIVE mg/dL
Leukocytes,Ua: NEGATIVE
Nitrite: NEGATIVE
Protein, ur: NEGATIVE mg/dL
Specific Gravity, Urine: 1.008 (ref 1.005–1.030)
pH: 7 (ref 5.0–8.0)

## 2023-07-17 LAB — CBC
HCT: 40 % (ref 36.0–46.0)
Hemoglobin: 13 g/dL (ref 12.0–15.0)
MCH: 29.7 pg (ref 26.0–34.0)
MCHC: 32.5 g/dL (ref 30.0–36.0)
MCV: 91.5 fL (ref 80.0–100.0)
Platelets: 219 10*3/uL (ref 150–400)
RBC: 4.37 MIL/uL (ref 3.87–5.11)
RDW: 12.2 % (ref 11.5–15.5)
WBC: 5.6 10*3/uL (ref 4.0–10.5)
nRBC: 0 % (ref 0.0–0.2)

## 2023-07-17 LAB — BASIC METABOLIC PANEL
Anion gap: 9 (ref 5–15)
BUN: 15 mg/dL (ref 8–23)
CO2: 25 mmol/L (ref 22–32)
Calcium: 9.1 mg/dL (ref 8.9–10.3)
Chloride: 100 mmol/L (ref 98–111)
Creatinine, Ser: 0.83 mg/dL (ref 0.44–1.00)
GFR, Estimated: >60 mL/min (ref >60)
Glucose, Bld: 110 mg/dL — ABNORMAL HIGH (ref 70–99)
Potassium: 4.7 mmol/L (ref 3.5–5.1)
Sodium: 134 mmol/L — ABNORMAL LOW (ref 135–145)

## 2023-07-17 LAB — TROPONIN I (HIGH SENSITIVITY)
Troponin I (High Sensitivity): 4 ng/L (ref <18)
Troponin I (High Sensitivity): 4 ng/L (ref <18)

## 2023-07-17 LAB — CK: Total CK: 62 U/L (ref 38–234)

## 2023-07-17 NOTE — Plan of Care (Signed)
Problem: Consults Goal: RH STROKE PATIENT EDUCATION Description: See Patient Education module for education specifics  Outcome: Progressing   Problem: RH SAFETY Goal: RH STG ADHERE TO SAFETY PRECAUTIONS W/ASSISTANCE/DEVICE Description: STG Adhere to Safety Precautions With cues Assistance/Device. Outcome: Progressing   Problem: RH KNOWLEDGE DEFICIT Goal: RH STG INCREASE KNOWLEDGE OF HYPERTENSION Description: Patient will be able to manage HTN with medications and dietary modification using educational resources independently Outcome: Progressing Goal: RH STG INCREASE KNOWLEGDE OF HYPERLIPIDEMIA Description: Patient will be able to manage HLD with medications and dietary modification using educational resources independently Outcome: Progressing Goal: RH STG INCREASE KNOWLEDGE OF STROKE PROPHYLAXIS Description: Patient will be able to manage secondary risks with medications and dietary modification using educational resources independently Outcome: Progressing

## 2023-07-17 NOTE — Discharge Instructions (Addendum)
Please go back to taking atorvastatin 10 mg 4 times a week and to bystolic 10 mg daily.

## 2023-07-17 NOTE — ED Provider Notes (Signed)
[place] EMERGENCY DEPARTMENT AT [facility] Provider Note   CSN: [id number] Arrival date & time: 07/17/23  1041     History  Chief Complaint  Patient presents with   Palpitations    Chloe Holmes is a 77 y.o. female.  Pt is a 77 yo female with pmhx significant for HTN, arthritis, GERD, HLD, seizures, palpitations and CVA.  Pt was released from the hospital today after an admission for CVA, new onset seizure, and right facial fx from fall.  She is very sensitive to medications and her lipitor was increased to 20 mg daily.  She was taking 10 mg 4 times a week which was all she could handle in the past.  She said she was also not put on her old bp meds which also helped to control palpitations.  She was on bystolic 10 mg daily and that was cut in half.  When she was standing at the pharmacy getting her meds, she felt like her heart was palpating.  She decided to come here to make sure things were ok.  She feels better now and feels like her heart is back in sinus rhythm.  She just feels tired.         Home Medications Prior to Admission medications   Medication Sig Start Date End Date Taking? Authorizing Provider  acetaminophen (TYLENOL) 325 MG tablet Take 1-2 tablets (325-650 mg total) by mouth every 4 (four) hours as needed for mild pain (pain score 1-3). 07/16/23   Setzer, Lynnell Jude, PA-C  aspirin EC 81 MG tablet Take 1 tablet (81 mg total) by mouth daily. Swallow whole. 07/16/23   Setzer, Lynnell Jude, PA-C  atorvastatin (LIPITOR) 20 MG tablet Take 1 tablet (20 mg total) by mouth daily. 07/16/23   Setzer, Lynnell Jude, PA-C  clopidogrel (PLAVIX) 75 MG tablet Take 1 tablet (75 mg total) by mouth daily for 12 days. 07/18/23 07/30/23  Setzer, Lynnell Jude, PA-C  divalproex (DEPAKOTE) 500 MG DR tablet Take 1 tablet (500 mg total) by mouth every 12 (twelve) hours. 07/16/23   Setzer, Lynnell Jude, PA-C  LINZESS 290 MCG CAPS capsule Take 1 capsule (290 mcg total) by mouth daily before  breakfast. Patient taking differently: Take 290 mcg by mouth daily before breakfast. 03/31/23   Pyrtle, Carie Caddy, MD  nebivolol (BYSTOLIC) 5 MG tablet Take 1 tablet (5 mg total) by mouth daily. 07/17/23   Setzer, Lynnell Jude, PA-C  esomeprazole (NEXIUM) 40 MG capsule Take 40 mg by mouth daily before breakfast.  10/31/11  [provider]      Allergies    Morphine and Pantoprazole    Review of Systems   Review of Systems  Cardiovascular:  Positive for palpitations.  All other systems reviewed and are negative.   Physical Exam Updated Vital Signs BP (!) 176/87   Pulse 61   Temp 98.6 F (37 C) (Oral)   Resp 19   Ht 5' 4.5" (1.638 m)   Wt 88.5 kg   SpO2 100%   BMI 32.95 kg/m  Physical Exam Vitals and nursing note reviewed.  Constitutional:      Appearance: Normal appearance.  HENT:     Head: Normocephalic.     Comments: Old bruising to right face    Right Ear: External ear normal.     Left Ear: External ear normal.     Nose: Nose normal.     Mouth/Throat:     Mouth: Mucous membranes are moist.  Pharynx: Oropharynx is clear.  Eyes:     Extraocular Movements: Extraocular movements intact.     Conjunctiva/sclera: Conjunctivae normal.     Pupils: Pupils are equal, round, and reactive to light.  Cardiovascular:     Rate and Rhythm: Normal rate and regular rhythm.     Pulses: Normal pulses.     Heart sounds: Normal heart sounds.  Pulmonary:     Effort: Pulmonary effort is normal.     Breath sounds: Normal breath sounds.  Abdominal:     General: Abdomen is flat. Bowel sounds are normal.     Palpations: Abdomen is soft.  Musculoskeletal:        General: Normal range of motion.     Cervical back: Normal range of motion and neck supple.  Skin:    General: Skin is warm.     Capillary Refill: Capillary refill takes less than 2 seconds.  Neurological:     General: No focal deficit present.     Mental Status: She is alert and oriented to person, place, and time.   Psychiatric:        Mood and Affect: Mood normal.        Behavior: Behavior normal.     ED Results / Procedures / Treatments   Labs (all labs ordered are listed, but only abnormal results are displayed) Labs Reviewed  BASIC METABOLIC PANEL - Abnormal; Notable for the following components:      Result Value   Sodium 134 (*)    Glucose, Bld 110 (*)    All other components within normal limits  CBC  URINALYSIS, ROUTINE W REFLEX MICROSCOPIC  CK  CBG MONITORING, ED  TROPONIN I (HIGH SENSITIVITY)  TROPONIN I (HIGH SENSITIVITY)    EKG EKG Interpretation Date/Time:  Friday July 17 2023 10:30:31 EST Ventricular Rate:  68 PR Interval:  142 QRS Duration:  78 QT Interval:  384 QTC Calculation: 408 R Axis:   37  Text Interpretation: Sinus rhythm with marked sinus arrhythmia Otherwise normal ECG When compared with ECG of 05-Jul-2023 04:53, PREVIOUS ECG IS PRESENT No significant change since last tracing Confirmed by Jacalyn Lefevre [phone]) on 07/17/2023 1:52:42 PM  Radiology DG Chest 2 View  Result Date: 07/17/2023 CLINICAL DATA:  Shortness of breath. Discharge from rehab started having palpitations EXAM: CHEST - 2 VIEW COMPARISON:  Chest radiographs 06/01/2018 FINDINGS: Cardiac silhouette is again mildly enlarged. Mediastinal contours are unchanged and within normal limits. The lungs are clear. Unchanged small peripherally calcified granuloma within the lateral, mid to upper left lung. No pleural effusion pneumothorax. Moderate multilevel degenerative disc changes of the mid to lower thoracic spine. Mild levocurvature of the upper lumbar spine. IMPRESSION: 1. No active cardiopulmonary disease. 2. Unchanged mild cardiomegaly. Electronically Signed   By: Neita Garnet M.D.   On: 07/17/2023 12:02    Procedures Procedures    Medications Ordered in ED Medications - No data to display  ED Course/ Medical Decision Making/ A&P                                 Medical Decision  Making Amount and/or Complexity of Data Reviewed Labs: ordered. Radiology: ordered.   This patient presents to the ED for concern of palpitations, this involves an extensive number of treatment options, and is a complaint that carries with it a high risk of complications and morbidity.  The differential diagnosis includes af, pacs, vt, electrolyte abn  Co morbidities that complicate the patient evaluation  HTN, arthritis, GERD, HLD, seizures, palpitations and CVA.   Additional history obtained:  Additional history obtained from epic chart review  Lab Tests:  I Ordered, and personally interpreted labs.  The pertinent results include:  cbc nl, bmp nl, trop nl, ck nl at 62, ua nl   Imaging Studies ordered:  I ordered imaging studies including cxr  I independently visualized and interpreted imaging which showed  No active cardiopulmonary disease.  2. Unchanged mild cardiomegaly.   I agree with the radiologist interpretation   Cardiac Monitoring:  The patient was maintained on a cardiac monitor.  I personally viewed and interpreted the cardiac monitored which showed an underlying rhythm of: nsr   Medicines ordered and prescription drug management:   I have reviewed the patients home medicines and have made adjustments as needed  Problem List / ED Course:  Palpitations:  I told pt to go back to her usual dose of 10 mg bystolic.  As she's had similar sx with statin increases, she's told to go back to 10 mg 4 times a week.  She has meds at home.  She is to f/u with pcp.  Return if worse.    Reevaluation:  After the interventions noted above, I reevaluated the patient and found that they have :improved   Social Determinants of Health:  Lives at home   Dispostion:  After consideration of the diagnostic results and the patients response to treatment, I feel that the patent would benefit from discharge with outpatient f/u.          Final Clinical Impression(s)  / ED Diagnoses Final diagnoses:  Palpitations  Adverse effect of drug, initial encounter    Rx / DC Orders ED Discharge Orders     None         Jacalyn Lefevre, MD 07/17/23 1538

## 2023-07-17 NOTE — Progress Notes (Signed)
Upon completion of discharge of patient this morning, staff was escorting her and her friend/driver out of the building and stopped at the transition of care pharmacy to pick up her current prescriptions.  She developed palpitations and felt "shaky".  Staff returned her to the [facility] and called me.  Upon evaluation, she was comfortable in her wheelchair denied chest pain, nausea or shortness of breath.  She endorsed palpitations.  She stated previously while on statin medication she had had PVCs.  She has been tolerating her statin medication since acute admission.  Her rehabilitation admission, she had no symptoms or signs of arrhythmia.  On examination, she had a normal rate with irregular rhythm.  No change in mental status and no tremors or signs of seizure activity.  Advised regarding emergency department evaluation.  Staff escorted patient and her friend to the ED.

## 2023-07-17 NOTE — Progress Notes (Signed)
Inpatient Rehabilitation Discharge Medication Review by a Pharmacist  A complete drug regimen review was completed for this patient to identify any potential clinically significant medication issues.  High Risk Drug Classes Is patient taking? Indication by Medication  Antipsychotic No   Anticoagulant No   Antibiotic No   Opioid No   Antiplatelet Yes Aspirin 81 mg and Clopidogrel for 3 weeks, then Aspirin alone - L PCA stenosis  Hypoglycemics/insulin No   Vasoactive Medication Yes Nebivolol daily- HTN   Chemotherapy No   Other Yes Atorvastatin - hyperlipidemia Divalproex - seizure prophylaxis   PRNs: Acetaminophen - mild pain Linaclotide - constipation due to IBS     Type of Medication Issue Identified Description of Issue Recommendation(s)  Drug Interaction(s) (clinically significant)     Duplicate Therapy     Allergy     No Medication Administration End Date  Clopidogrel stop time not yet in place.  Begun 07/08/23. Last dose of Clopidogrel should be on 07/30/23.  Incorrect Dose     Additional Drug Therapy Needed     Significant med changes from prior encounter (inform family/care partners about these prior to discharge). Nebivolol dose currently lower than PTA. Atorvastatin dose increased. New Clopidogrel, Divalproex.   Communicate changes with patient/family prior to discharge.    Other       Clinically significant medication issues were identified that warrant physician communication and completion of prescribed/recommended actions by midnight of the next day:  No  Pharmacist comments:  - Clopidogrel planned for 3 weeks, stop date 07/30/23.  Time spent performing this drug regimen review (minutes):  20  Jani Gravel, PharmD Clinical Pharmacist  07/17/2023 12:35 PM

## 2023-07-17 NOTE — Progress Notes (Signed)
Patient ID: Chloe Holmes, female   DOB: [birth year], 77 y.o.   MRN: [id number]  SW met with patient to discuss d/c questions or concerns. Patient packed and ready. Transportation will arrive at 10 AM. Sw will informed staff. No additional questions or concerns

## 2023-07-17 NOTE — Progress Notes (Signed)
PROGRESS NOTE   Subjective/Complaints:  No new issues. Up making her bed. Pleased with progress, improved balance and lower ext strength. Still disputes the fact that she had a seizure.   ROS: Patient denies fever, rash, sore throat, blurred vision, dizziness, nausea, vomiting, diarrhea, cough, shortness of breath or chest pain, joint or back/neck pain, headache, or mood change.   Objective:   No results found. No results for input(s): "WBC", "HGB", "HCT", "PLT" in the last 72 hours.  No results for input(s): "NA", "K", "CL", "CO2", "GLUCOSE", "BUN", "CREATININE", "CALCIUM" in the last 72 hours.   Intake/Output Summary (Last 24 hours) at 07/17/2023 0903 Last data filed at 07/17/2023 0700 Gross per 24 hour  Intake 1071 ml  Output --  Net 1071 ml        Physical Exam: Vital Signs Blood pressure (!) 152/77, pulse 61, temperature 98 F (36.7 C), temperature source Oral, resp. rate 18, height 5\' 5"  (1.651 m), weight 87.2 kg, SpO2 99%.  Constitutional: No distress . Vital signs reviewed. HEENT: NCAT, EOMI, oral membranes moist Neck: supple Cardiovascular: RRR without murmur. No JVD    Respiratory/Chest: CTA Bilaterally without wheezes or rales. Normal effort    GI/Abdomen: BS +, non-tender, non-distended Ext: no clubbing, cyanosis, or edema Psych: pleasant and cooperative  Skin: + R facial bruising resolving   MSK: No pain with UE or LE AROM              Neurologic exam:  Alert and oriented x 3. Normal insight and awareness. Intact Memory. Normal language and speech. Cranial nerve exam unremarkable. MMT: 5/5 in all 4's. Good balance..     Assessment/Plan: 1. Functional deficits which require 3+ hours per day of interdisciplinary therapy in a comprehensive inpatient rehab setting. Physiatrist is providing close team supervision and 24 hour management of active medical problems listed below. Physiatrist and rehab  team continue to assess barriers to discharge/monitor patient progress toward functional and medical goals  Care Tool:  Bathing    Body parts bathed by patient: Right arm, Left arm, Chest, Abdomen, Front perineal area, Buttocks, Right upper leg, Face, Left lower leg, Right lower leg, Left upper leg         Bathing assist Assist Level: Independent with assistive device     Upper Body Dressing/Undressing Upper body dressing   What is the patient wearing?: Pull over shirt    Upper body assist Assist Level: Independent    Lower Body Dressing/Undressing Lower body dressing      What is the patient wearing?: Underwear/pull up, Pants     Lower body assist Assist for lower body dressing: Independent     Toileting Toileting    Toileting assist Assist for toileting: Independent     Transfers Chair/bed transfer  Transfers assist     Chair/bed transfer assist level: Independent with assistive device     Locomotion Ambulation   Ambulation assist      Assist level: Supervision/Verbal cueing Assistive device: Other (comment) (Walking Stick) Max distance: 300   Walk 10 feet activity   Assist     Assist level: Independent with assistive device Assistive device:  (Walkin stick)   Walk  50 feet activity   Assist    Assist level: Supervision/Verbal cueing Assistive device:  (Walking Stick)    Walk 150 feet activity   Assist    Assist level: Supervision/Verbal cueing Assistive device:  (Walking Stick)    Walk 10 feet on uneven surface  activity   Assist Walk 10 feet on uneven surfaces activity did not occur: Safety/medical concerns   Assist level: Supervision/Verbal cueing Assistive device:  (Walking Stick)   Wheelchair     Assist Is the patient using a wheelchair?: No             Wheelchair 50 feet with 2 turns activity    Assist            Wheelchair 150 feet activity     Assist          Blood pressure (!)  152/77, pulse 61, temperature 98 F (36.7 C), temperature source Oral, resp. rate 18, height 5\' 5"  (1.651 m), weight 87.2 kg, SpO2 99%.    Medical Problem List and Plan: 1. 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 Outpt therapy. F/u with neuro/pcp   No PMR f/u needed    2.  Antithrombotics: -DVT/anticoagulation:  Pharmaceutical: Lovenox may d/c             -antiplatelet therapy: Aspirin and Plavix for three weeks followed by aspirin alone starting Jul 31, 2023   3. Pain Management: Tylenol, Fioricet as needed   4. Mood/Behavior/Sleep: LCSW to evaluate and provide emotional support             -antipsychotic agents: n/a   5. Neuropsych/cognition: This patient is capable of making decisions on her own behalf.   6. Skin/Wound Care: Routine skin care checks   7. Fluids/Electrolytes/Nutrition: Routine Is and Os and follow-up chemistries             -on heart healthy diet with thin liquids             -continue folic acid and MVI   8: New onset seizure:             -continue Depakote 500 mg every 8 hours- based on levels reduce to q12h             -Follow-up neurology as outpatient              - EEG 11/14 with left cortical dysfunction but otherwise unremarkable--spoke with pt regarding seizure treatment. Neurology feels she will need to be on prophylaxis life long- will need to f/u with Neuro as OP    Discussed driving post seizure .None per state low until seizure free x 6 mo, must be cleared by neuro    -she will need to discuss seizures with neuro as outpt. She understands that she cannot drive at this point 9: Hyperlipidemia: continue statin   10: Headache: Fioricet as needed   11: Visual disturbance/hallucinations: changed from Keppra to Depakote on 11/13 (repeat MRI 11/13 acute punctate infarction left parietal cortex)              - improved  12: Orbital floor fracture OD (no  entrapment): -Fredderick Phenix, MD (ophthalmology) and Nelva Nay. Elijah Birk, MD (ENT) consulted on 11/10.  Recommend to reconsult in 1 week per hospitalist, Dr. Lowell Guitar. Right inferior rectus herniation which would result in decreased downward gaze  - do not  think diplopia is related to CVA 13: IBS/C: reordered Linzess  (30-60 minutes before breakfast); patient wants control of this med, discussed with nursing and pharmacy, magnesium gluconate 250mg  ordered HS   14: History of hypertension: home Bystolic 10 mg and hydrochlorothiazide 20 mg currently held for permissive HTN              - Add PRN hydralazine 10 mg Q8H for Systolic >190, Diastolic >110- not needed   Restarted Bistolic 2.5mg  daily on 11/15--increased to 5mg  11/20   -11/22 bps fair. F/u with primary Vitals:   07/16/23 1953 07/17/23 0445  BP: (!) 148/80 (!) 152/77  Pulse: 62 61  Resp: 18 18  Temp: 98.7 F (37.1 C) 98 F (36.7 C)  SpO2: 100% 99%    16. Suboptimal potassium: kdur 10 ordered 11/15- stable at 3.9 on 11/18, no need to recheck prior to d/c- has not  needed hydrochlorothiazide thus far , f/u Dr Timothy Lasso     Latest Ref Rng & Units 07/13/2023    4:59 AM 07/09/2023    8:17 AM 07/08/2023    4:38 AM  BMP  Glucose 70 - 99 mg/dL 85  99  98   BUN 8 - 23 mg/dL 19  20  20    Creatinine 0.44 - 1.00 mg/dL 4.09  8.11  9.14   Sodium 135 - 145 mmol/L 134  138  138   Potassium 3.5 - 5.1 mmol/L 3.9  3.9  3.9   Chloride 98 - 111 mmol/L 102  108  108   CO2 22 - 32 mmol/L 25  23  21    Calcium 8.9 - 10.3 mg/dL 8.7  8.9  8.7    LOS: 8 days A FACE TO FACE EVALUATION WAS PERFORMED  Ranelle Oyster 07/17/2023, 9:03 AM

## 2023-07-17 NOTE — ED Triage Notes (Signed)
Pt to ED from rehab. Pt was being discharged from rehab, pt started having palpitations and they were unable to put her back in and told her to come to ED. Pt had an incidental finding of a stroke and her atorvastatin was increased 20mg  every day from 4 days a week recently and she states this has happened with regular use of other statins in the past. Pt denies pain. Pt states she feels "wiped out" and has shortness of breath with movement.

## 2023-07-20 NOTE — Plan of Care (Signed)
  Problem: RH Balance Goal: LTG Patient will maintain dynamic standing balance (PT) Description: LTG:  Patient will maintain dynamic standing balance with assistance during mobility activities (PT) 07/20/2023 0943 by Loel Dubonnet, PT Outcome: Completed/Met 07/20/2023 0941 by Loel Dubonnet, PT Flowsheets (Taken 07/20/2023 [phone]) LTG: Pt will maintain dynamic standing balance during mobility activities with:: Independent with assistive device    Problem: Sit to Stand Goal: LTG:  Patient will perform sit to stand with assistance level (PT) Description: LTG:  Patient will perform sit to stand with assistance level (PT) 07/20/2023 0943 by Loel Dubonnet, PT Outcome: Completed/Met 07/20/2023 0941 by Loel Dubonnet, PT Flowsheets (Taken 07/14/2023 1719) LTG: PT will perform sit to stand in preparation for functional mobility with assistance level: Independent   Problem: RH Bed Mobility Goal: LTG Patient will perform bed mobility with assist (PT) Description: LTG: Patient will perform bed mobility with assistance, with/without cues (PT). 07/20/2023 0943 by Loel Dubonnet, PT Outcome: Completed/Met 07/20/2023 0941 by Loel Dubonnet, PT Flowsheets (Taken 07/11/2023 [phone]) LTG: Pt will perform bed mobility with assistance level of: Independent   Problem: RH Bed to Chair Transfers Goal: LTG Patient will perform bed/chair transfers w/assist (PT) Description: LTG: Patient will perform bed to chair transfers with assistance (PT). 07/20/2023 0943 by Loel Dubonnet, PT Outcome: Completed/Met 07/20/2023 0941 by Loel Dubonnet, PT Flowsheets (Taken 07/14/2023 1719) LTG: Pt will perform Bed to Chair Transfers with assistance level: Independent with assistive device    Problem: RH Car Transfers Goal: LTG Patient will perform car transfers with assist (PT) Description: LTG: Patient will perform car transfers with assistance (PT). 07/20/2023 0943 by Loel Dubonnet, PT Outcome:  Completed/Met 07/20/2023 0941 by Loel Dubonnet, PT Flowsheets (Taken 07/11/2023 [phone]) LTG: Pt will perform car transfers with assist:: Supervision/Verbal cueing   Problem: RH Furniture Transfers Goal: LTG Patient will perform furniture transfers w/assist (OT/PT) Description: LTG: Patient will perform furniture transfers  with assistance (OT/PT). 07/20/2023 0943 by Loel Dubonnet, PT Outcome: Completed/Met 07/20/2023 0941 by Loel Dubonnet, PT Flowsheets (Taken 07/14/2023 1719) LTG: Pt will perform furniture transfers with assist:: Independent with assistive device    Problem: RH Ambulation Goal: LTG Patient will ambulate in controlled environment (PT) Description: LTG: Patient will ambulate in a controlled environment, # of feet with assistance (PT). 07/20/2023 0943 by Loel Dubonnet, PT Outcome: Completed/Met 07/20/2023 0941 by Loel Dubonnet, PT Flowsheets (Taken 07/11/2023 [phone]) LTG: Pt will ambulate in controlled environ  assist needed:: Supervision/Verbal cueing LTG: Ambulation distance in controlled environment: for more than 150 ft using LRAD Goal: LTG Patient will ambulate in home environment (PT) Description: LTG: Patient will ambulate in home environment, # of feet with assistance (PT). 07/20/2023 0943 by Loel Dubonnet, PT Outcome: Completed/Met 07/20/2023 0941 by Loel Dubonnet, PT Flowsheets (Taken 07/11/2023 [phone]) LTG: Pt will ambulate in home environ  assist needed:: Supervision/Verbal cueing LTG: Ambulation distance in home environment: up to 50 ft bouts using LRAD   Problem: RH Stairs Goal: LTG Patient will ambulate up and down stairs w/assist (PT) Description: LTG: Patient will ambulate up and down # of stairs with assistance (PT) 07/20/2023 0943 by Loel Dubonnet, PT Outcome: Completed/Met 07/20/2023 0941 by Loel Dubonnet, PT Flowsheets (Taken 07/11/2023 [phone]) LTG: Pt will ambulate up/down stairs assist needed:: Supervision/Verbal cueing LTG: Pt will   ambulate up and down number of stairs: at least one step with no HR as per home environment

## 2023-07-28 DIAGNOSIS — D692 Other nonthrombocytopenic purpura: Secondary | ICD-10-CM | POA: Diagnosis not present

## 2023-07-28 DIAGNOSIS — D044 Carcinoma in situ of skin of scalp and neck: Secondary | ICD-10-CM | POA: Diagnosis not present

## 2023-07-28 DIAGNOSIS — D1801 Hemangioma of skin and subcutaneous tissue: Secondary | ICD-10-CM | POA: Diagnosis not present

## 2023-07-28 DIAGNOSIS — L570 Actinic keratosis: Secondary | ICD-10-CM | POA: Diagnosis not present

## 2023-07-28 DIAGNOSIS — L300 Nummular dermatitis: Secondary | ICD-10-CM | POA: Diagnosis not present

## 2023-07-28 DIAGNOSIS — L814 Other melanin hyperpigmentation: Secondary | ICD-10-CM | POA: Diagnosis not present

## 2023-07-28 DIAGNOSIS — L821 Other seborrheic keratosis: Secondary | ICD-10-CM | POA: Diagnosis not present

## 2023-07-29 DIAGNOSIS — M1712 Unilateral primary osteoarthritis, left knee: Secondary | ICD-10-CM | POA: Diagnosis not present

## 2023-07-30 DIAGNOSIS — I10 Essential (primary) hypertension: Secondary | ICD-10-CM | POA: Diagnosis not present

## 2023-07-30 DIAGNOSIS — I499 Cardiac arrhythmia, unspecified: Secondary | ICD-10-CM | POA: Diagnosis not present

## 2023-07-30 DIAGNOSIS — R531 Weakness: Secondary | ICD-10-CM | POA: Diagnosis not present

## 2023-07-30 DIAGNOSIS — R002 Palpitations: Secondary | ICD-10-CM | POA: Diagnosis not present

## 2023-07-30 DIAGNOSIS — I6930 Unspecified sequelae of cerebral infarction: Secondary | ICD-10-CM | POA: Diagnosis not present

## 2023-07-30 DIAGNOSIS — Z87898 Personal history of other specified conditions: Secondary | ICD-10-CM | POA: Diagnosis not present

## 2023-07-30 DIAGNOSIS — S0292XA Unspecified fracture of facial bones, initial encounter for closed fracture: Secondary | ICD-10-CM | POA: Diagnosis not present

## 2023-07-30 DIAGNOSIS — R296 Repeated falls: Secondary | ICD-10-CM | POA: Diagnosis not present

## 2023-07-30 DIAGNOSIS — Z8669 Personal history of other diseases of the nervous system and sense organs: Secondary | ICD-10-CM | POA: Diagnosis not present

## 2023-07-30 DIAGNOSIS — I493 Ventricular premature depolarization: Secondary | ICD-10-CM | POA: Diagnosis not present

## 2023-08-03 DIAGNOSIS — M1712 Unilateral primary osteoarthritis, left knee: Secondary | ICD-10-CM | POA: Diagnosis not present

## 2023-08-05 ENCOUNTER — Encounter: Payer: Self-pay | Admitting: Cardiology

## 2023-08-05 ENCOUNTER — Ambulatory Visit: Payer: Medicare Other | Attending: Cardiology | Admitting: Cardiology

## 2023-08-05 ENCOUNTER — Ambulatory Visit: Payer: Medicare Other | Admitting: Cardiology

## 2023-08-05 VITALS — BP 164/95 | HR 83 | Resp 16 | Ht 64.0 in | Wt 207.0 lb

## 2023-08-05 DIAGNOSIS — I10 Essential (primary) hypertension: Secondary | ICD-10-CM | POA: Diagnosis not present

## 2023-08-05 DIAGNOSIS — R002 Palpitations: Secondary | ICD-10-CM | POA: Diagnosis not present

## 2023-08-05 DIAGNOSIS — E7800 Pure hypercholesterolemia, unspecified: Secondary | ICD-10-CM | POA: Diagnosis not present

## 2023-08-05 DIAGNOSIS — I63312 Cerebral infarction due to thrombosis of left middle cerebral artery: Secondary | ICD-10-CM | POA: Insufficient documentation

## 2023-08-05 DIAGNOSIS — M1712 Unilateral primary osteoarthritis, left knee: Secondary | ICD-10-CM | POA: Diagnosis not present

## 2023-08-05 MED ORDER — HYDRALAZINE HCL 25 MG PO TABS: 25.0000 mg | ORAL_TABLET | Freq: Three times a day (TID) | ORAL | 2 refills | Status: AC | PRN

## 2023-08-05 MED ORDER — LOSARTAN POTASSIUM 25 MG PO TABS: 25.0000 mg | ORAL_TABLET | ORAL | 2 refills | Status: DC

## 2023-08-05 NOTE — Progress Notes (Signed)
Cardiology Office Note:  .   Date:  08/05/2023  ID:  Chloe Holmes, DOB 1945-08-26, MRN [id number] PCP: Creola Corn, MD  [facility] Providers Cardiologist:  Yates Decamp, MD   History of Present Illness: Chloe Holmes   Chloe Holmes is a 77 y.o. 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  who presented with  fall hitting the front of her head following which she was aphasic.  Eventually diagnosed by neurology as fall with probable new onset seizure given left hemispheric slowing on EEG and acute punctate left parietal cortex stroke.  She was discharged to inpatient rehab and had a 8-day stay and eventually discharged home on 07/17/2023.  She is now referred to me for evaluation of palpitations and fluctuating blood pressure issues.     Discussed the use of AI scribe software for clinical note transcription with the patient, who gave verbal consent to proceed.  History of Present Illness   The patient, with a history of hypertension and sensitivity to medications, presents with concerns about a recent episode of confusion, aphasia, and visual hallucinations following a fall. The patient woke up shortly after midnight, feeling confused and experiencing a slight headache, mainly in the back of the head. The patient also reported intermittent vision loss. The patient lives alone and was found by emergency services who called a stroke code at the hospital. The patient hypothesizes that she  may have accidentally doubled her dose of blood pressure medication, leading to her symptoms.  The patient also reports a history of palpitations, describing them as a rush and mix of feelings that last less than a minute or two. These palpitations are infrequent but cause weakness and force the patient to rest. The patient also reports a long-standing cough and neuropathy in her feet.  The patient has been trying to manage her blood pressure, which is sensitive and can drop quickly. Prior to the hospital visit, the patient was on Bystolic 10mg  at night for blood pressure control, with hydralazine as needed if the blood pressure rose above 155. The patient also reports sensitivity to Lipitor, which she was taking three times a week.  The patient has been attending physical therapy twice a week due to arthritis and back issues. She also reports a recent weight loss attempt using the Opdivo diet.      Review of Systems  Cardiovascular:  Positive for palpitations. Negative for chest pain, dyspnea on exertion and leg swelling.    Labs   Lab Results  Component Value Date   CHOL 186 07/05/2023   HDL 77 07/05/2023   LDLCALC 94 07/05/2023   TRIG 75 07/05/2023   CHOLHDL 2.4 07/05/2023   Lab Results  Component Value Date   NA 134 (L) 07/17/2023   K 4.7 07/17/2023   CO2 25 07/17/2023   GLUCOSE 110 (H) 07/17/2023   BUN 15 07/17/2023   CREATININE 0.83 07/17/2023   CALCIUM 9.1 07/17/2023   GFRNONAA >60 07/17/2023      Latest Ref Rng & Units 07/17/2023   10:59 AM 07/13/2023  4:59 AM 07/09/2023    8:17 AM  BMP  Glucose 70 - 99 mg/dL 295  85  99   BUN 8 - 23 mg/dL 15  19  20    Creatinine 0.44 - 1.00 mg/dL 6.21  3.08  6.57   Sodium 135 - 145 mmol/L 134  134  138   Potassium 3.5 - 5.1 mmol/L 4.7  3.9  3.9   Chloride 98 - 111 mmol/L 100  102  108   CO2 22 - 32 mmol/L 25  25  23    Calcium 8.9 - 10.3 mg/dL 9.1  8.7  8.9       Latest Ref Rng & Units 07/17/2023   10:59 AM 07/13/2023     4:59 AM 07/09/2023    8:17 AM  CBC  WBC 4.0 - 10.5 K/uL 5.6  4.7  5.5   Hemoglobin 12.0 - 15.0 g/dL 84.6  96.2  95.2   Hematocrit 36.0 - 46.0 % 40.0  37.3  39.8   Platelets 150 - 400 K/uL 219  196  201    Physical Exam:   VS:  BP (!) 164/95 (BP Location: Left Arm, Patient Position: Sitting, Cuff Size: Large)   Pulse 83   Resp 16   Ht 5\' 4"  (1.626 m)   Wt 207 lb (93.9 kg)   SpO2 94%   BMI 35.53 kg/m    Wt Readings from Last 3 Encounters:  08/05/23 207 lb (93.9 kg)  07/17/23 195 lb (88.5 kg)  07/09/23 192 lb 3.9 oz (87.2 kg)     Physical Exam Constitutional:      Appearance: She is obese.  Neck:     Vascular: No carotid bruit or JVD.  Cardiovascular:     Rate and Rhythm: Normal rate and regular rhythm. Occasional Extrasystoles are present.    Pulses: Intact distal pulses.     Heart sounds: Normal heart sounds. No murmur heard.    No gallop.  Pulmonary:     Effort: Pulmonary effort is normal.     Breath sounds: Normal breath sounds.  Abdominal:     General: Bowel sounds are normal.     Palpations: Abdomen is soft.  Musculoskeletal:     Right lower leg: No edema.     Left lower leg: No edema.     Studies Reviewed: .    NM MYOCAR MULTI W/SPECT W 06/02/2018  1. No reversible ischemia or infarction.  2. Normal left ventricular wall motion.  3. Left ventricular ejection fraction 63%  4. Non invasive risk stratification*: Low  ECHOCARDIOGRAM COMPLETE 07/09/2023  1. Left ventricular ejection fraction, by estimation, is 55 to 60%. The left ventricle has normal function. The left ventricle has no regional wall motion abnormalities. There is mild left ventricular hypertrophy. Left ventricular diastolic parameters are consistent with Grade I diastolic dysfunction (impaired relaxation). 2. Right ventricular systolic function is normal. The right ventricular size is normal. There is normal pulmonary artery systolic pressure. 3. Left atrial size was mildly dilated. 4. The  mitral valve is normal in structure. Trivial mitral valve regurgitation. No evidence of mitral stenosis. 5. The aortic valve is tricuspid. Aortic valve regurgitation is not visualized. No aortic stenosis is present. 6. The inferior vena cava is normal in size with greater than 50% respiratory variability, suggesting right atrial pressure of 3 mmHg.   EKG:    EKG Interpretation Date/Time:  Wednesday August 05 2023 16:04:11 EST Ventricular Rate:  77 PR Interval:  146 QRS Duration:  80  QT Interval:  384 QTC Calculation: 434 R Axis:   4  Text Interpretation: EKG 08/05/2023: Normal sinus rhythm at rate of 77 bpm, normal axis, diffuse nonspecific ST-T abnormality.  Compared to 07/17/2023, nonspecific ST-T abnormality   , new  Confirmed by Delrae Rend [phone]) on 08/05/2023 4:14:59 PM    Medications and allergies    Allergies  Allergen Reactions   Morphine Other (See Comments)    Hypotension; had to be narcan'd . This was 40 years ago   Pantoprazole Cough     Current Outpatient Medications:    acetaminophen (TYLENOL) 325 MG tablet, Take 1-2 tablets (325-650 mg total) by mouth every 4 (four) hours as needed for mild pain (pain score 1-3)., Disp: , Rfl:    aspirin EC 81 MG tablet, Take 1 tablet (81 mg total) by mouth daily. Swallow whole., Disp: 30 tablet, Rfl: 0   atorvastatin (LIPITOR) 10 MG tablet, Take 10 mg by mouth 4 (four) times a week., Disp: , Rfl:    divalproex (DEPAKOTE) 500 MG DR tablet, Take 1 tablet (500 mg total) by mouth every 12 (twelve) hours., Disp: 60 tablet, Rfl: 0   hydrALAZINE (APRESOLINE) 25 MG tablet, Take 1 tablet (25 mg total) by mouth 3 (three) times daily as needed (BP > 140/90 mm Hg)., Disp: 90 tablet, Rfl: 2   LINZESS 290 MCG CAPS capsule, Take 1 capsule (290 mcg total) by mouth daily before breakfast. (Patient taking differently: Take 290 mcg by mouth as needed.), Disp: 30 capsule, Rfl: 3   losartan (COZAAR) 25 MG tablet, Take 1 tablet (25 mg total) by  mouth every morning., Disp: 30 tablet, Rfl: 2   nebivolol (BYSTOLIC) 10 MG tablet, Take 10 mg by mouth every evening., Disp: , Rfl:    ASSESSMENT AND PLAN: .      ICD-10-CM   1. Palpitations  R00.2 EKG 12-Lead    nebivolol (BYSTOLIC) 10 MG tablet    2. Pure hypercholesterolemia  E78.00     3. Primary hypertension  I10 losartan (COZAAR) 25 MG tablet    nebivolol (BYSTOLIC) 10 MG tablet    hydrALAZINE (APRESOLINE) 25 MG tablet    4. Cerebrovascular accident (CVA) due to thrombosis of left middle cerebral artery [facility])  U04.540       Assessment and Plan    Cerebrovascular Accident (CVA) Recent small punctate stroke in the temporal region with resultant right hand clumsiness. No current neurological deficits. -Continue Aspirin 81mg  daily for secondary stroke prevention.  Hypertension History of blood pressure fluctuations. Previously well controlled on Bystolic 10mg  at night and PRN Hydralazine 25mg . -Resume Bystolic, starting at 5mg  and increase to 10mg  if blood pressure remains high after a few days. -Continue PRN Hydralazine 25mg  for systolic blood pressure above 981. -Start Losartan 25mg  in the morning for additional blood pressure control and vascular protection.  Premature Ventricular Contractions (PVCs) Infrequent, brief episodes of palpitations. -Start over-the-counter B1, B6, and B12 supplements as these may help reduce palpitations.  Hyperlipidemia Previously experienced side effects with daily Lipitor. -Continue current regimen of Lipitor 20mg  three times a week (Monday, Wednesday, Friday).  Arthritis Chronic issue, currently managed with Tylenol up to 1200mg  daily. -Continue current management.  Follow-up in 2 months to reassess blood pressure control, palpitations, and overall health status.     She does have abnormal EKG with nonspecific ST-T changes, would not recommend any further follow-up of the same as she is completely asymptomatic without any chest pain or  dyspnea.  If she were  to develop any new symptoms, we could consider stress testing.  I reviewed her stress test from 2019 which is nonischemic.   I spent 55 minutes in the care of [place] Holmes today including reviewing labs ( ), reviewing studies ( ), face to face time discussing treatment options ( ), reviewing records from recent hospitalization ( ), and documenting in the encounter.   Signed,  Yates Decamp, MD, [facility] 08/05/2023, 5:33 PM [facility] [address] [place], [place] [address] Phone: [phone]. Fax:  [phone]

## 2023-08-05 NOTE — Patient Instructions (Signed)
Medication Instructions:  Please restart Bystoic 10 mg  - take this in the evening. Start Losartan 25 mg every morning. Please take the B complex vitamins as discussed with Dr Jacinto Halim. Continue all other medications as listed.  *If you need a refill on your cardiac medications before your next appointment, please call your pharmacy*  Follow-Up: At [facility], you and your health needs are our priority.  As part of our continuing mission to provide you with exceptional heart care, we have created designated Provider Care Teams.  These Care Teams include your primary Cardiologist (physician) and Advanced Practice Providers (APPs -  Physician Assistants and Nurse Practitioners) who all work together to provide you with the care you need, when you need it.  We recommend signing up for the patient portal called "MyChart".  Sign up information is provided on this After Visit Summary.  MyChart is used to connect with patients for Virtual Visits (Telemedicine).  Patients are able to view lab/test results, encounter notes, upcoming appointments, etc.  Non-urgent messages can be sent to your provider as well.   To learn more about what you can do with MyChart, go to [email or web address].    Your next appointment:   2 month(s)  Provider:   Dr Jacinto Halim

## 2023-08-10 DIAGNOSIS — M1712 Unilateral primary osteoarthritis, left knee: Secondary | ICD-10-CM | POA: Diagnosis not present

## 2023-08-11 ENCOUNTER — Ambulatory Visit (INDEPENDENT_AMBULATORY_CARE_PROVIDER_SITE_OTHER): Payer: Medicare Other | Admitting: Neurology

## 2023-08-11 ENCOUNTER — Encounter: Payer: Self-pay | Admitting: Neurology

## 2023-08-11 VITALS — BP 151/85 | HR 71 | Ht 65.0 in | Wt 204.0 lb

## 2023-08-11 DIAGNOSIS — R569 Unspecified convulsions: Secondary | ICD-10-CM | POA: Diagnosis not present

## 2023-08-11 DIAGNOSIS — I6381 Other cerebral infarction due to occlusion or stenosis of small artery: Secondary | ICD-10-CM | POA: Diagnosis not present

## 2023-08-11 MED ORDER — DIVALPROEX SODIUM ER 500 MG PO TB24: 500.0000 mg | ORAL_TABLET | Freq: Every evening | ORAL | 3 refills | Status: DC

## 2023-08-11 NOTE — Progress Notes (Signed)
[facility]  PATIENT: Chloe Holmes DOB: 11/01/1945  REQUESTING CLINICIAN: Zigmund Daniel.,* HISTORY FROM: Patient/Chart review  REASON FOR VISIT: Seizures/Stroke    HISTORICAL  CHIEF COMPLAINT:  Chief Complaint  Patient presents with   Hospitalization Follow-up    Rm12, alone,  Hospital f/u for CVA - discharged home 11/22 - 4-6 week f/u:pt stated doing great, concerned with taking lots of depakote and thinks it makes her slow moving    HISTORY OF PRESENT ILLNESS:  This is a 77 year old woman past medical history hypertension, hyperlipidemia, GERD who is presenting after being admitted to the hospital on October 10 for an episode of acute confusion, and aphasia.  In the hospital, her initial MRI did not show any acute abnormality and EEG showed left hemispheric slowing.  She was worked up also for encephalitis which was negative.  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  She was initially started on Keppra for possible seizures, but she did have side effect of hallucinations, Keppra switched to Depakote.  She completed acute rehab and meet all goals.  Currently she does not have any deficits from the stroke.  She is compliant with the Depakote but does reports side effect of somnolence.  She denies any previous history of seizures, denies any seizure risk factors. Currently she is getting physical therapy but for her chronic back pain that she anterior over the summer.   Hospital course and Summary  Chloe Holmes is a 77 y.o. female who presented to the emergency department on 07/05/2023 after a fall at home and hitting her head. She remembers the fall stating she tripped over her feet while trying to retrieve her phone that she had dropped. She was able to call EMS via her smart watch and she was found wandering in her backyard. Bruising was evident on her nose and right hand and she was presenting with word salad. No  witnesses. She lives alone. She was brought in as code stroke and initial MRI was negative for acute finding. CT head was negative for acute large territory infarct or hypodensity however right orbital floor fracture was noted. CTA head and neck without LVO. cEEG performed. LTM with continuous slow, left hemisphere, maximal left temporal region. Due to these findings, neurology felt her event was likely due to a seizure. Keppra load was initiated. ENT and ophthalmology were consulted for right orbital floor fracture. While history was limited due to the patient's aphasia, there were no signs of entrapment and no intervention. She developed visual hallucinations on 11-12 and repeat CT head was performed. Keppra dose was increased and repeat EEG was performed 11/13. She continued to endorse intermittent double vision and hallucinations of what appeared to be small blood vessels or branches on the wall appearing as wallpaper. Repeat MRI with and without contrast obtained. Keppra was discontinued due to possible side effect of visual hallucinations. She was started on Depakote. Aspirin and Plavix were started for 3 weeks then aspirin alone planned. Lovenox started for DVT prophylaxis. 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 Hemoglobin A1c is 5.2%. CBC and CMP within normal limits. 2D echo with estimated left ventricular ejection fraction of 55 to 60%. Left ventricular diastolic parameters are consistent with grade 1 diastolic dysfunction. She has no signs or symptoms of heart failure. Heart healthy diet with thin liquids. She is requiring min assist for safe transfers and gait with rollator.  Hospital Course: Chloe Holmes was admitted to rehab 07/09/2023 for inpatient therapies to consist of PT, ST and OT at least three hours five days a week. Past admission physiatrist, therapy team and rehab RN have worked together to provide customized collaborative inpatient rehab.     Blood pressures were monitored on TID basis and home medicines held.  Hydralazine 10 mg every 8 hours for elevated blood pressure per parameters added.  Bystolic restarted 2.5 mg daily on 11/15.  EEG on 11/14 with left cortical dysfunction but otherwise unremarkable.  This was discussed with the patient regarding seizure treatment.  Neurology feels she will need to be on prophylaxis lifelong.  She reported persistent nausea associated with Depakote.  Valproic acid levels checked as well as LFTs.  Patient was seen by Dr. Ernestene Kiel for ENT follow-up on 11/17. Labs stable/LFTS within normal limits 11/18.    Rehab course: During patient's stay in rehab weekly team conferences were held to monitor patient's progress, set goals and discuss barriers to discharge. At admission, patient required supervision with basic self-care skills and min assist with mobility.   She has had improvement in activity tolerance, balance, postural control as well as ability to compensate for deficits. She has had improvement in functional use RUE/LUE  and RLE/LLE as well as improvement in awareness Patient has met 11 of 11 long term goals due to improved activity tolerance, improved balance, ability to compensate for deficits, and improved coordination.  Patient to discharge at overall Modified Independent level.  Patient's care partner is independent to provide the necessary physical assistance at discharge with driving and running errands.  OTHER MEDICAL CONDITIONS: Hypertension, Hyperlipidemia, GERD   REVIEW OF SYSTEMS: Full 14 system review of systems performed and negative with exception of: As noted in the HPI   ALLERGIES: Allergies  Allergen Reactions   Morphine Other (See Comments)    Hypotension; had to be narcan'd . This was 40 years ago   Pantoprazole Cough    HOME MEDICATIONS: Outpatient Medications Prior to Visit  Medication Sig Dispense Refill   acetaminophen (TYLENOL) 325 MG tablet Take 1-2 tablets  (325-650 mg total) by mouth every 4 (four) hours as needed for mild pain (pain score 1-3).     aspirin EC 81 MG tablet Take 1 tablet (81 mg total) by mouth daily. Swallow whole. 30 tablet 0   atorvastatin (LIPITOR) 10 MG tablet Take 10 mg by mouth 4 (four) times a week.     hydrALAZINE (APRESOLINE) 25 MG tablet Take 1 tablet (25 mg total) by mouth 3 (three) times daily as needed (BP > 140/90 mm Hg). 90 tablet 2   LINZESS 290 MCG CAPS capsule Take 1 capsule (290 mcg total) by mouth daily before breakfast. (Patient taking differently: Take 290 mcg by mouth as needed.) 30 capsule 3   losartan (COZAAR) 25 MG tablet Take 1 tablet (25 mg total) by mouth every morning. 30 tablet 2   nebivolol (BYSTOLIC) 10 MG tablet Take 10 mg by mouth every evening.     divalproex (DEPAKOTE) 500 MG DR tablet Take 1 tablet (500 mg total) by mouth every 12 (twelve) hours. 60 tablet 0   No facility-administered medications prior to visit.    PAST MEDICAL HISTORY: Past Medical History:  Diagnosis Date   Arthritis    Diverticulosis    Dyspnea    At pre-op , pt  (a retired Insurance underwriter) reports past Hx of SOB that she found out was being caused by frequent PVCs,  she believes the PVCs were related to her poor tolerance of daily statin medication regimen ; however reports resolution of SOB now that she is only taking statin three times a week and taking bystolic to manage dysrhythmia.   GERD (gastroesophageal reflux disease)    History of hiatal hernia    HLD (hyperlipidemia)    Hypertension     PAST SURGICAL HISTORY: Past Surgical History:  Procedure Laterality Date   ABDOMINAL HYSTERECTOMY     CERVICAL DISC SURGERY     COLONOSCOPY  2016   DILATION AND CURETTAGE OF UTERUS     ESOPHAGEAL MANOMETRY N/A 10/03/2013   Procedure: ESOPHAGEAL MANOMETRY (EM);  Surgeon: Beverley Fiedler, MD;  Location: [facility];  Service: Gastroenterology;  Laterality: N/A;   EYE SURGERY     bilateral cataracts    ORBITAL FRACTURE SURGERY      TOTAL KNEE ARTHROPLASTY Left 01/27/2019   Procedure: TOTAL KNEE ARTHROPLASTY;  Surgeon: Durene Romans, MD;  Location: [facility];  Service: Orthopedics;  Laterality: Left;  70 mins    FAMILY HISTORY: Family History  Problem Relation Age of Onset   Heart disease Mother    Cancer Mother        colon   Colon cancer Mother    Hypertension Father    Heart disease Father    Lung cancer Father    Cancer Father        lung   Stomach cancer Paternal Grandfather    Esophageal cancer Neg Hx    Rectal cancer Neg Hx    Breast cancer Neg Hx     SOCIAL HISTORY: Social History   Socioeconomic History   Marital status: Divorced    Spouse name: Not on file   Number of children: Not on file   Years of education: Not on file   Highest education level: Not on file  Occupational History   Not on file  Tobacco Use   Smoking status: Never   Smokeless tobacco: Never  Vaping Use   Vaping status: Never Used  Substance and Sexual Activity   Alcohol use: Yes    Alcohol/week: 7.0 - 10.0 standard drinks of alcohol    Types: 7 - 10 Glasses of wine per week    Comment: 3 glassess a day    Drug use: Never   Sexual activity: Not Currently    Birth control/protection: Post-menopausal  Other Topics Concern   Not on file  Social History Narrative   ** Merged History Encounter **       Social Drivers of Health   Financial Resource Strain: Not on file  Food Insecurity: No Food Insecurity (07/06/2023)   Hunger Vital Sign    Worried About Running Out of Food in the Last Year: Never true    Ran Out of Food in the Last Year: Never true  Transportation Needs: No Transportation Needs (07/06/2023)   PRAPARE - Administrator, Civil Service (Medical): No    Lack of Transportation (Non-Medical): No  Physical Activity: Not on file  Stress: Not on file  Social Connections: Not on file  Intimate Partner Violence: Not At Risk (07/06/2023)   Humiliation, Afraid, Rape, and Kick questionnaire     Fear of Current or Ex-Partner: No    Emotionally Abused: No    Physically Abused: No    Sexually Abused: No    PHYSICAL EXAM  GENERAL EXAM/CONSTITUTIONAL: Vitals:  Vitals:   08/11/23 1043  BP: (!) 151/85  Pulse: 71  Weight: 204 lb (92.5 kg)  Height: 5\' 5"  (1.651 m)   Body mass index is 33.95 kg/m. Wt Readings from Last 3 Encounters:  08/11/23 204 lb (92.5 kg)  08/05/23 207 lb (93.9 kg)  07/17/23 195 lb (88.5 kg)   Patient is in no distress; well developed, nourished and groomed; neck is supple  MUSCULOSKELETAL: Gait, strength, tone, movements noted in Neurologic exam below  NEUROLOGIC: MENTAL STATUS:      No data to display         awake, alert, oriented to person, place and time recent and remote memory intact normal attention and concentration language fluent, comprehension intact, naming intact fund of knowledge appropriate  CRANIAL NERVE:  2nd, 3rd, 4th, 6th - Visual fields full to confrontation, extraocular muscles intact, no nystagmus 5th - facial sensation symmetric 7th - facial strength symmetric 8th - hearing intact 9th - palate elevates symmetrically, uvula midline 11th - shoulder shrug symmetric 12th - tongue protrusion midline  MOTOR:  normal bulk and tone, full strength in the BUE, BLE  SENSORY:  normal and symmetric to light touch  COORDINATION:  finger-nose-finger, fine finger movements normal  GAIT/STATION:  normal   DIAGNOSTIC DATA (LABS, IMAGING, TESTING) - I reviewed patient records, labs, notes, testing and imaging myself where available.  Lab Results  Component Value Date   WBC 5.6 07/17/2023   HGB 13.0 07/17/2023   HCT 40.0 07/17/2023   MCV 91.5 07/17/2023   PLT 219 07/17/2023      Component Value Date/Time   NA 134 (L) 07/17/2023 1059   K 4.7 07/17/2023 1059   CL 100 07/17/2023 1059   CO2 25 07/17/2023 1059   GLUCOSE 110 (H) 07/17/2023 1059   BUN 15 07/17/2023 1059   CREATININE 0.83 07/17/2023 1059   CALCIUM  9.1 07/17/2023 1059   PROT 6.2 (L) 07/13/2023 0459   ALBUMIN 3.4 (L) 07/13/2023 0459   AST 23 07/13/2023 0459   ALT 19 07/13/2023 0459   ALKPHOS 51 07/13/2023 0459   BILITOT 0.7 07/13/2023 0459   GFRNONAA >60 07/17/2023 1059   GFRAA >60 01/28/2019 0423   Lab Results  Component Value Date   CHOL 186 07/05/2023   HDL 77 07/05/2023   LDLCALC 94 07/05/2023   TRIG 75 07/05/2023   CHOLHDL 2.4 07/05/2023   Lab Results  Component Value Date   HGBA1C 5.2 07/05/2023   No results found for: "VITAMINB12" Lab Results  Component Value Date   TSH 2.239 07/05/2023    EEG 07/09/2023 Intermittent slow, left temporal region   MRI Brain 07/08/2023 1. Punctate (1 mm) acute infarction affecting the left parietal cortex. As an isolated finding, this could be incidental. 2. Chronic small-vessel ischemic changes of the pons and cerebral hemispheric white matter. 3. Known orbital blowout fracture of the orbital floor on the right. 4. No imaging finding to suggest the diagnosis of meningitis.    ASSESSMENT AND PLAN  77 y.o. year old female with history of hypertension, hyperlipidemia, GERD who is presenting after being admitted to the hospital for an episode of confusion, fall, and possible seizures.  She is currently doing well on Depakote 500 mg twice daily but she does have side effect of somnolence and fatigue.  Will switch it to extended release 500 mg nightly.  For her punctate stroke, etiology likely small vessel disease.  She completed aspirin and Plavix for 21 days and currently on aspirin alone.  Plan will be to continue patient current medications and I will see  her in 1 year for follow-up.  We also discussed driving restriction for a total of 81-months, she voiced understanding.   1. Seizure-like activity (HCC)   2. 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   Meds ordered this encounter  Medications   divalproex (DEPAKOTE ER) 500 MG 24 hr tablet    Sig: Take 1 tablet (500 mg total) by mouth at bedtime.    Dispense:  90 tablet    Refill:  3    Return in about 1 year (around 08/10/2024).    Windell Norfolk, MD 08/11/2023, 5:07 PM  [facility] [address], [address] [place], [place] [address] [phone]

## 2023-08-11 NOTE — Patient Instructions (Addendum)
Decrease Depakote to 500 mg XR nightly  Continue your other medications  Driving restriction for a total of 6 months  Return in a year or sooner if worse

## 2023-08-12 DIAGNOSIS — M1712 Unilateral primary osteoarthritis, left knee: Secondary | ICD-10-CM | POA: Diagnosis not present

## 2023-08-14 DIAGNOSIS — J34829 Nasal valve collapse, unspecified: Secondary | ICD-10-CM | POA: Diagnosis not present

## 2023-08-14 DIAGNOSIS — S0231XS Fracture of orbital floor, right side, sequela: Secondary | ICD-10-CM | POA: Diagnosis not present

## 2023-08-14 DIAGNOSIS — S0083XS Contusion of other part of head, sequela: Secondary | ICD-10-CM | POA: Diagnosis not present

## 2023-08-17 DIAGNOSIS — M1712 Unilateral primary osteoarthritis, left knee: Secondary | ICD-10-CM | POA: Diagnosis not present

## 2023-08-20 DIAGNOSIS — M1712 Unilateral primary osteoarthritis, left knee: Secondary | ICD-10-CM | POA: Diagnosis not present

## 2023-08-31 DIAGNOSIS — M1712 Unilateral primary osteoarthritis, left knee: Secondary | ICD-10-CM | POA: Diagnosis not present

## 2023-09-03 ENCOUNTER — Other Ambulatory Visit (HOSPITAL_BASED_OUTPATIENT_CLINIC_OR_DEPARTMENT_OTHER): Payer: Self-pay

## 2023-09-03 DIAGNOSIS — M1712 Unilateral primary osteoarthritis, left knee: Secondary | ICD-10-CM | POA: Diagnosis not present

## 2023-09-07 DIAGNOSIS — M1712 Unilateral primary osteoarthritis, left knee: Secondary | ICD-10-CM | POA: Diagnosis not present

## 2023-09-10 DIAGNOSIS — M1712 Unilateral primary osteoarthritis, left knee: Secondary | ICD-10-CM | POA: Diagnosis not present

## 2023-09-11 ENCOUNTER — Other Ambulatory Visit (HOSPITAL_COMMUNITY): Payer: Self-pay

## 2023-09-14 DIAGNOSIS — M1712 Unilateral primary osteoarthritis, left knee: Secondary | ICD-10-CM | POA: Diagnosis not present

## 2023-09-15 ENCOUNTER — Ambulatory Visit: Payer: Medicare Other | Attending: Cardiology | Admitting: Cardiology

## 2023-09-15 ENCOUNTER — Encounter: Payer: Self-pay | Admitting: Cardiology

## 2023-09-15 VITALS — BP 124/84 | HR 57 | Ht 64.5 in | Wt 212.8 lb

## 2023-09-15 DIAGNOSIS — I10 Essential (primary) hypertension: Secondary | ICD-10-CM | POA: Insufficient documentation

## 2023-09-15 DIAGNOSIS — R600 Localized edema: Secondary | ICD-10-CM | POA: Diagnosis not present

## 2023-09-15 DIAGNOSIS — R002 Palpitations: Secondary | ICD-10-CM | POA: Insufficient documentation

## 2023-09-15 MED ORDER — FUROSEMIDE 20 MG PO TABS: 20.0000 mg | ORAL_TABLET | Freq: Every day | ORAL | 0 refills | Status: AC | PRN

## 2023-09-15 MED ORDER — HYDROCHLOROTHIAZIDE 25 MG PO TABS: 25.0000 mg | ORAL_TABLET | ORAL | Status: AC

## 2023-09-15 MED ORDER — NEBIVOLOL HCL 10 MG PO TABS: 10.0000 mg | ORAL_TABLET | Freq: Every evening | ORAL | 0 refills | Status: AC

## 2023-09-15 MED ORDER — LOSARTAN POTASSIUM 25 MG PO TABS: 12.5000 mg | ORAL_TABLET | ORAL | Status: DC

## 2023-09-15 NOTE — Patient Instructions (Addendum)
Medication Instructions:  Your physician has recommended you make the following change in your medication:  Resume hydrochlorothiazide 25 mg by mouth daily  Start losartan 12.5 mg by mouth daily    *If you need a refill on your cardiac medications before your next appointment, please call your pharmacy*   Lab Work: none If you have labs (blood work) drawn today and your tests are completely normal, you will receive your results only by: MyChart Message (if you have MyChart) OR A paper copy in the mail If you have any lab test that is abnormal or we need to change your treatment, we will call you to review the results.   Testing/Procedures: none   Follow-Up: At [facility], you and your health needs are our priority.  As part of our continuing mission to provide you with exceptional heart care, we have created designated Provider Care Teams.  These Care Teams include your primary Cardiologist (physician) and Advanced Practice Providers (APPs -  Physician Assistants and Nurse Practitioners) who all work together to provide you with the care you need, when you need it.  We recommend signing up for the patient portal called "MyChart".  Sign up information is provided on this After Visit Summary.  MyChart is used to connect with patients for Virtual Visits (Telemedicine).  Patients are able to view lab/test results, encounter notes, upcoming appointments, etc.  Non-urgent messages can be sent to your provider as well.   To learn more about what you can do with MyChart, go to [email or web address].    Your next appointment:   As needed  Provider:   Yates Decamp, MD     Other Instructions

## 2023-09-15 NOTE — Progress Notes (Signed)
Cardiology Office Note:  .   Date:  09/15/2023  ID:  Kathee, Hutcheson Apr 02, 1946, MRN [id number] PCP: Creola Corn, MD  [facility] Providers Cardiologist:  Yates Decamp, MD   History of Present Illness: Chloe Holmes   Journiee Drier is a 78 y.o. 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   She presents for follow-up of palpitations and hypertension.  Discussed the use of AI scribe software for clinical note transcription with the patient, who gave verbal consent to proceed.  History of Present Illness   The patient, with a history of hypertension and palpitations, presents with labile blood pressure and leg swelling. She reports stopping losartan about a month ago due to low blood pressure readings, often dropping to 90/50, which left her unable to function. She also stopped taking hydrochlorothiazide. The patient notes that her blood pressure seems to increase when she is constipated and decrease after bowel movements. She is currently taking Linzess every third day for constipation, which she reports is very effective.  The patient's palpitations have improved significantly with Bystolic, experiencing them only once since the last visit. However, she has developed a cough, which she is unsure if it is related to Bystolic.  In addition to these, the patient has been experiencing significant leg swelling for the past two weeks, which she attributes to stopping hydrochlorothiazide and a high salt intake on one occasion. She reports that she has been on her feet a lot recently due to caring for a friend with a back issue.       Labs   Lab Results   Component Value Date   CHOL 186 07/05/2023   HDL 77 07/05/2023   LDLCALC 94 07/05/2023   TRIG 75 07/05/2023   CHOLHDL 2.4 07/05/2023   Lab Results  Component Value Date   NA 134 (L) 07/17/2023   K 4.7 07/17/2023   CO2 25 07/17/2023   GLUCOSE 110 (H) 07/17/2023   BUN 15 07/17/2023   CREATININE 0.83 07/17/2023   CALCIUM 9.1 07/17/2023   GFRNONAA >60 07/17/2023      Latest Ref Rng & Units 07/17/2023   10:59 AM 07/13/2023    4:59 AM 07/09/2023    8:17 AM  BMP  Glucose 70 - 99 mg/dL 413  85  99   BUN 8 - 23 mg/dL 15  19  20    Creatinine 0.44 - 1.00 mg/dL 2.44  0.10  2.72   Sodium 135 - 145 mmol/L 134  134  138   Potassium 3.5 - 5.1 mmol/L 4.7  3.9  3.9   Chloride 98 - 111 mmol/L 100  102  108   CO2 22 - 32 mmol/L 25  25  23    Calcium 8.9 - 10.3 mg/dL 9.1  8.7  8.9       Latest Ref Rng & Units 07/17/2023   10:59 AM 07/13/2023    4:59 AM 07/09/2023    8:17 AM  CBC  WBC 4.0 - 10.5 K/uL 5.6  4.7  5.5   Hemoglobin 12.0 - 15.0 g/dL 53.6  64.4  03.4   Hematocrit 36.0 - 46.0 % 40.0  37.3  39.8   Platelets 150 - 400 K/uL 219  196  201  External Labs:   Review of Systems  Cardiovascular:  Positive for leg swelling (2 weeks). Negative for chest pain and dyspnea on exertion.    Physical Exam:   VS:  BP 124/84   Pulse (!) 57   Ht 5' 4.5" (1.638 m)   Wt 212 lb 12.8 oz (96.5 kg)   SpO2 98%   BMI 35.96 kg/m    Wt Readings from Last 3 Encounters:  09/15/23 212 lb 12.8 oz (96.5 kg)  08/11/23 204 lb (92.5 kg)  08/05/23 207 lb (93.9 kg)     Physical Exam Constitutional:      Appearance: She is obese.  Neck:     Vascular: No carotid bruit or JVD.  Cardiovascular:     Rate and Rhythm: Normal rate and regular rhythm.     Pulses: Intact distal pulses.     Heart sounds: Normal heart sounds. No murmur heard.    No gallop.  Pulmonary:     Effort: Pulmonary effort is normal.     Breath sounds: Normal breath sounds.  Abdominal:     General: Bowel sounds are normal.      Palpations: Abdomen is soft.  Musculoskeletal:     Right lower leg: Edema (2+ pitting bilateral below-knee) present.     Left lower leg: Edema (2+ pitting bilateral below-knee) present.     Studies Reviewed: Chloe Holmes    ECHOCARDIOGRAM COMPLETE 07/09/2023  1. Left ventricular ejection fraction, by estimation, is 55 to 60%. The left ventricle has normal function. The left ventricle has no regional wall motion abnormalities. There is mild left ventricular hypertrophy. Left ventricular diastolic parameters are consistent with Grade I diastolic dysfunction (impaired relaxation).  EKG:         EKG 08/05/2023: Normal sinus rhythm at rate of 77 bpm, normal axis, diffuse nonspecific ST-T abnormality. Compared to 07/17/2023, nonspecific ST-T abnormality , new   Medications and allergies    Allergies  Allergen Reactions   Morphine Other (See Comments)    Hypotension; had to be narcan'd . This was 40 years ago   Pantoprazole Cough     Current Outpatient Medications:    acetaminophen (TYLENOL) 325 MG tablet, Take 1-2 tablets (325-650 mg total) by mouth every 4 (four) hours as needed for mild pain (pain score 1-3)., Disp: , Rfl:    aspirin EC 81 MG tablet, Take 1 tablet (81 mg total) by mouth daily. Swallow whole., Disp: 30 tablet, Rfl: 0   atorvastatin (LIPITOR) 10 MG tablet, Take 10 mg by mouth 4 (four) times a week., Disp: , Rfl:    divalproex (DEPAKOTE ER) 500 MG 24 hr tablet, Take 1 tablet (500 mg total) by mouth at bedtime., Disp: 90 tablet, Rfl: 3   furosemide (LASIX) 20 MG tablet, Take 1 tablet (20 mg total) by mouth daily as needed for edema., Disp: 30 tablet, Rfl: 0   hydrALAZINE (APRESOLINE) 25 MG tablet, Take 1 tablet (25 mg total) by mouth 3 (three) times daily as needed (BP > 140/90 mm Hg)., Disp: 90 tablet, Rfl: 2   hydrochlorothiazide (HYDRODIURIL) 25 MG tablet, Take 1 tablet (25 mg total) by mouth every morning., Disp: , Rfl:    LINZESS 290 MCG CAPS capsule, Take 1 capsule (290 mcg  total) by mouth daily before breakfast. (Patient taking differently: Take 290 mcg by mouth as needed.), Disp: 30 capsule, Rfl: 3   losartan (COZAAR) 25 MG tablet, Take 0.5 tablets (12.5 mg total) by mouth every morning., Disp: , Rfl:  nebivolol (BYSTOLIC) 10 MG tablet, Take 1 tablet (10 mg total) by mouth every evening., Disp: 90 tablet, Rfl: 0   ASSESSMENT AND PLAN: .      ICD-10-CM   1. Primary hypertension  I10 nebivolol (BYSTOLIC) 10 MG tablet    losartan (COZAAR) 25 MG tablet    hydrochlorothiazide (HYDRODIURIL) 25 MG tablet    2. Palpitations  R00.2 nebivolol (BYSTOLIC) 10 MG tablet    3. Bilateral leg edema  R60.0 furosemide (LASIX) 20 MG tablet     Assessment and Plan    Hypertension Labile blood pressure with patient self-discontinuation of Losartan due to hypotensive episodes. Discussed the importance of consistent antihypertensive medication use and the potential benefits of dose adjustment. -Resume Losartan at a reduced dose of 12.5mg  daily. -Continue Hydrochlorothiazide 25mg  daily.  Lower extremity edema Recent onset, likely secondary to discontinuation of Hydrochlorothiazide and possible dietary sodium intake. -Initiate Furosemide 20mg  as needed for fluid overload. -Resume Hydrochlorothiazide 25mg  daily.  Constipation Patient reports improvement with Linzess, but only taking every third day due to loose stools. -Consider daily Linzess at a reduced dose to promote regular bowel movements.  Palpitations Well controlled on Bystolic. -Continue Bystolic, refill prescription.  Plan to transition primary care back to Dr. Creola Corn with specialist follow-up as needed.            Signed,  Yates Decamp, MD, [facility] 09/15/2023, 10:15 AM [facility] [address] [place], [place] [address] Phone: [phone]. Fax:  [phone]

## 2023-09-17 DIAGNOSIS — M1712 Unilateral primary osteoarthritis, left knee: Secondary | ICD-10-CM | POA: Diagnosis not present

## 2023-09-22 DIAGNOSIS — M1712 Unilateral primary osteoarthritis, left knee: Secondary | ICD-10-CM | POA: Diagnosis not present

## 2023-09-24 DIAGNOSIS — M1712 Unilateral primary osteoarthritis, left knee: Secondary | ICD-10-CM | POA: Diagnosis not present

## 2023-09-28 DIAGNOSIS — M1712 Unilateral primary osteoarthritis, left knee: Secondary | ICD-10-CM | POA: Diagnosis not present

## 2023-10-01 DIAGNOSIS — M1712 Unilateral primary osteoarthritis, left knee: Secondary | ICD-10-CM | POA: Diagnosis not present

## 2023-10-08 DIAGNOSIS — M1712 Unilateral primary osteoarthritis, left knee: Secondary | ICD-10-CM | POA: Diagnosis not present

## 2023-10-12 DIAGNOSIS — M1712 Unilateral primary osteoarthritis, left knee: Secondary | ICD-10-CM | POA: Diagnosis not present

## 2023-10-22 DIAGNOSIS — M1712 Unilateral primary osteoarthritis, left knee: Secondary | ICD-10-CM | POA: Diagnosis not present

## 2023-12-31 DIAGNOSIS — Z1212 Encounter for screening for malignant neoplasm of rectum: Secondary | ICD-10-CM | POA: Diagnosis not present

## 2023-12-31 DIAGNOSIS — E785 Hyperlipidemia, unspecified: Secondary | ICD-10-CM | POA: Diagnosis not present

## 2023-12-31 DIAGNOSIS — R739 Hyperglycemia, unspecified: Secondary | ICD-10-CM | POA: Diagnosis not present

## 2023-12-31 DIAGNOSIS — I10 Essential (primary) hypertension: Secondary | ICD-10-CM | POA: Diagnosis not present

## 2024-01-01 DIAGNOSIS — H40013 Open angle with borderline findings, low risk, bilateral: Secondary | ICD-10-CM | POA: Diagnosis not present

## 2024-01-01 DIAGNOSIS — Z961 Presence of intraocular lens: Secondary | ICD-10-CM | POA: Diagnosis not present

## 2024-01-07 DIAGNOSIS — R600 Localized edema: Secondary | ICD-10-CM | POA: Diagnosis not present

## 2024-01-07 DIAGNOSIS — R82998 Other abnormal findings in urine: Secondary | ICD-10-CM | POA: Diagnosis not present

## 2024-01-07 DIAGNOSIS — M1990 Unspecified osteoarthritis, unspecified site: Secondary | ICD-10-CM | POA: Diagnosis not present

## 2024-01-07 DIAGNOSIS — I6930 Unspecified sequelae of cerebral infarction: Secondary | ICD-10-CM | POA: Diagnosis not present

## 2024-01-07 DIAGNOSIS — E669 Obesity, unspecified: Secondary | ICD-10-CM | POA: Diagnosis not present

## 2024-01-07 DIAGNOSIS — K5900 Constipation, unspecified: Secondary | ICD-10-CM | POA: Diagnosis not present

## 2024-01-07 DIAGNOSIS — I700 Atherosclerosis of aorta: Secondary | ICD-10-CM | POA: Diagnosis not present

## 2024-01-07 DIAGNOSIS — Z1331 Encounter for screening for depression: Secondary | ICD-10-CM | POA: Diagnosis not present

## 2024-01-07 DIAGNOSIS — Z0000 Encounter for general adult medical examination without abnormal findings: Secondary | ICD-10-CM | POA: Diagnosis not present

## 2024-01-07 DIAGNOSIS — G4700 Insomnia, unspecified: Secondary | ICD-10-CM | POA: Diagnosis not present

## 2024-01-07 DIAGNOSIS — I499 Cardiac arrhythmia, unspecified: Secondary | ICD-10-CM | POA: Diagnosis not present

## 2024-01-07 DIAGNOSIS — Z1389 Encounter for screening for other disorder: Secondary | ICD-10-CM | POA: Diagnosis not present

## 2024-01-07 DIAGNOSIS — R053 Chronic cough: Secondary | ICD-10-CM | POA: Diagnosis not present

## 2024-01-07 DIAGNOSIS — I10 Essential (primary) hypertension: Secondary | ICD-10-CM | POA: Diagnosis not present

## 2024-01-07 DIAGNOSIS — E785 Hyperlipidemia, unspecified: Secondary | ICD-10-CM | POA: Diagnosis not present

## 2024-01-20 DIAGNOSIS — G5603 Carpal tunnel syndrome, bilateral upper limbs: Secondary | ICD-10-CM | POA: Diagnosis not present

## 2024-01-20 DIAGNOSIS — M19041 Primary osteoarthritis, right hand: Secondary | ICD-10-CM | POA: Diagnosis not present

## 2024-01-20 DIAGNOSIS — M19042 Primary osteoarthritis, left hand: Secondary | ICD-10-CM | POA: Diagnosis not present

## 2024-01-25 DIAGNOSIS — R109 Unspecified abdominal pain: Secondary | ICD-10-CM | POA: Diagnosis not present

## 2024-01-25 DIAGNOSIS — N390 Urinary tract infection, site not specified: Secondary | ICD-10-CM | POA: Diagnosis not present

## 2024-01-25 DIAGNOSIS — R351 Nocturia: Secondary | ICD-10-CM | POA: Diagnosis not present

## 2024-01-27 DIAGNOSIS — M2042 Other hammer toe(s) (acquired), left foot: Secondary | ICD-10-CM | POA: Diagnosis not present

## 2024-01-27 DIAGNOSIS — M19072 Primary osteoarthritis, left ankle and foot: Secondary | ICD-10-CM | POA: Diagnosis not present

## 2024-01-27 DIAGNOSIS — M2041 Other hammer toe(s) (acquired), right foot: Secondary | ICD-10-CM | POA: Diagnosis not present

## 2024-01-27 DIAGNOSIS — M19071 Primary osteoarthritis, right ankle and foot: Secondary | ICD-10-CM | POA: Diagnosis not present

## 2024-04-21 ENCOUNTER — Other Ambulatory Visit: Payer: Self-pay

## 2024-05-09 ENCOUNTER — Other Ambulatory Visit: Payer: Self-pay | Admitting: Internal Medicine

## 2024-05-09 DIAGNOSIS — Z0000 Encounter for general adult medical examination without abnormal findings: Secondary | ICD-10-CM

## 2024-05-10 ENCOUNTER — Inpatient Hospital Stay
Admission: RE | Admit: 2024-05-10 | Discharge: 2024-05-10 | Discharge status: Home / Self Care | Source: Ambulatory Visit | Attending: Internal Medicine | Admitting: Internal Medicine

## 2024-05-10 DIAGNOSIS — Z0000 Encounter for general adult medical examination without abnormal findings: Secondary | ICD-10-CM

## 2024-06-15 ENCOUNTER — Emergency Department (HOSPITAL_BASED_OUTPATIENT_CLINIC_OR_DEPARTMENT_OTHER)
Admission: EM | Admit: 2024-06-15 | Discharge: 2024-06-15 | Disposition: A | Discharge status: Home / Self Care | Source: Ambulatory Visit | Attending: Emergency Medicine | Admitting: Emergency Medicine

## 2024-06-15 ENCOUNTER — Other Ambulatory Visit: Payer: Self-pay

## 2024-06-15 ENCOUNTER — Encounter (HOSPITAL_BASED_OUTPATIENT_CLINIC_OR_DEPARTMENT_OTHER): Payer: Self-pay

## 2024-06-15 ENCOUNTER — Emergency Department (HOSPITAL_BASED_OUTPATIENT_CLINIC_OR_DEPARTMENT_OTHER)

## 2024-06-15 DIAGNOSIS — R600 Localized edema: Secondary | ICD-10-CM | POA: Diagnosis present

## 2024-06-15 DIAGNOSIS — I10 Essential (primary) hypertension: Secondary | ICD-10-CM | POA: Insufficient documentation

## 2024-06-15 DIAGNOSIS — R053 Chronic cough: Secondary | ICD-10-CM | POA: Insufficient documentation

## 2024-06-15 DIAGNOSIS — Z7982 Long term (current) use of aspirin: Secondary | ICD-10-CM | POA: Insufficient documentation

## 2024-06-15 DIAGNOSIS — M7989 Other specified soft tissue disorders: Secondary | ICD-10-CM | POA: Insufficient documentation

## 2024-06-15 DIAGNOSIS — R2240 Localized swelling, mass and lump, unspecified lower limb: Secondary | ICD-10-CM | POA: Diagnosis not present

## 2024-06-15 DIAGNOSIS — R0602 Shortness of breath: Secondary | ICD-10-CM | POA: Diagnosis present

## 2024-06-15 LAB — COMPREHENSIVE METABOLIC PANEL WITH GFR
ALT: 18 U/L (ref 0–44)
AST: 28 U/L (ref 15–41)
Albumin: 4.4 g/dL (ref 3.5–5.0)
Alkaline Phosphatase: 76 U/L (ref 38–126)
Anion gap: 12 (ref 5–15)
BUN: 17 mg/dL (ref 8–23)
CO2: 27 mmol/L (ref 22–32)
Calcium: 9.3 mg/dL (ref 8.9–10.3)
Chloride: 94 mmol/L — ABNORMAL LOW (ref 98–111)
Creatinine, Ser: 0.78 mg/dL (ref 0.44–1.00)
GFR, Estimated: >60 mL/min (ref >60)
Glucose, Bld: 91 mg/dL (ref 70–99)
Potassium: 3.9 mmol/L (ref 3.5–5.1)
Sodium: 133 mmol/L — ABNORMAL LOW (ref 135–145)
Total Bilirubin: 0.3 mg/dL (ref 0.0–1.2)
Total Protein: 6.8 g/dL (ref 6.5–8.1)

## 2024-06-15 LAB — CBC
HCT: 36.5 % (ref 36.0–46.0)
Hemoglobin: 12.7 g/dL (ref 12.0–15.0)
MCH: 31.1 pg (ref 26.0–34.0)
MCHC: 34.8 g/dL (ref 30.0–36.0)
MCV: 89.5 fL (ref 80.0–100.0)
Platelets: 187 K/uL (ref 150–400)
RBC: 4.08 MIL/uL (ref 3.87–5.11)
RDW: 12.7 % (ref 11.5–15.5)
WBC: 6.3 K/uL (ref 4.0–10.5)
nRBC: 0 % (ref 0.0–0.2)

## 2024-06-15 LAB — PRO BRAIN NATRIURETIC PEPTIDE: Pro Brain Natriuretic Peptide: 1137 pg/mL — ABNORMAL HIGH (ref <300.0)

## 2024-06-15 NOTE — Discharge Instructions (Addendum)
 Follow up with your family doc in the clinic.  Please return for worsening symptoms, chest pain, headache, one-sided numbness or weakness or difficulty speech or swallowing.  Please let your family doctor know about your blood pressures.  Take your blood pressure medicines as prescribed.

## 2024-06-15 NOTE — ED Provider Notes (Signed)
 [place] EMERGENCY DEPARTMENT AT [facility] Provider Note   CSN: [id number] Arrival date & time: 06/15/24  8062     Patient presents with: Hypertension (/) and Leg Swelling   Chloe Holmes is a 78 y.o. female.   78 yo F with a cc of not feeling well.  Had 2 events that felt like this throughout the day.  1 this morning she said was mild and resolved on its own and then 1 just prior to evaluation here.  She said she felt kind of shaky all over and unwell.  Denies headache denies chest pain denies difficulty breathing.  Denied one-sided numbness or weakness or difficulty speech or swallowing.  She checked her blood pressure at that time and it was elevated.  She then called a friend who brought over a different blood pressure cuff and rechecked it it was still high. She tells me that she has multiple medications for her blood pressure.  It sounds like she has not felt she has needed them recently.  She tells me her doctor had her on a medication to take just when it was elevated but she could not find it and thought maybe it had expired and she threw it away.  She is also complaining of lower extremity edema.  This been going on for at least a couple weeks.  Sounds like it improves at night when she sleeps and then gets worse during the day.  She has been prescribed Lasix  by her PCP which she takes as needed.  She had a fairly salty meal last night and has been drinking a lot of fluid today.    Hypertension       Prior to Admission medications   Medication Sig Start Date End Date Taking? Authorizing Provider  acetaminophen  (TYLENOL ) 325 MG tablet Take 1-2 tablets (325-650 mg total) by mouth every 4 (four) hours as needed for mild pain (pain score 1-3). 07/16/23   Setzer, Nena PARAS, PA-C  aspirin  EC 81 MG tablet Take 1 tablet (81 mg total) by mouth daily. Swallow whole. 07/16/23   Setzer, Nena PARAS, PA-C  atorvastatin  (LIPITOR) 10 MG tablet Take 10 mg by mouth 4 (four)  times a week.    [provider]  divalproex  (DEPAKOTE  ER) 500 MG 24 hr tablet Take 1 tablet (500 mg total) by mouth at bedtime. 08/11/23 08/05/24  Camara, Amadou, MD  furosemide  (LASIX ) 20 MG tablet Take 1 tablet (20 mg total) by mouth daily as needed for edema. 09/15/23 12/14/23  Ladona Heinz, MD  hydrALAZINE  (APRESOLINE ) 25 MG tablet Take 1 tablet (25 mg total) by mouth 3 (three) times daily as needed (BP > 140/90 mm Hg). 08/05/23 11/03/23  Ladona Heinz, MD  hydrochlorothiazide  (HYDRODIURIL ) 25 MG tablet Take 1 tablet (25 mg total) by mouth every morning. 09/15/23 12/14/23  Ladona Heinz, MD  LINZESS  290 MCG CAPS capsule Take 1 capsule (290 mcg total) by mouth daily before breakfast. Patient taking differently: Take 290 mcg by mouth as needed. 03/31/23   Pyrtle, Heinz HERO, MD  losartan  (COZAAR ) 25 MG tablet Take 0.5 tablets (12.5 mg total) by mouth every morning. 09/15/23 12/14/23  Ladona Heinz, MD  nebivolol  (BYSTOLIC ) 10 MG tablet Take 1 tablet (10 mg total) by mouth every evening. 09/15/23   Ladona Heinz, MD  esomeprazole (NEXIUM) 40 MG capsule Take 40 mg by mouth daily before breakfast.  10/31/11  [provider]    Allergies: Morphine and Pantoprazole     Review of Systems  Updated Vital Signs BP (!) 191/99 (BP Location: Right Arm)   Pulse 64   Temp 98 F (36.7 C)   Resp 18   Ht 5' 4 (1.626 m)   Wt 98.9 kg   SpO2 99%   BMI 37.42 kg/m   Physical Exam Vitals and nursing note reviewed.  Constitutional:      General: She is not in acute distress.    Appearance: She is well-developed. She is not diaphoretic.  HENT:     Head: Normocephalic and atraumatic.  Eyes:     Pupils: Pupils are equal, round, and reactive to light.  Cardiovascular:     Rate and Rhythm: Normal rate and regular rhythm.     Heart sounds: No murmur heard.    No friction rub. No gallop.  Pulmonary:     Effort: Pulmonary effort is normal.     Breath sounds: No wheezing or rales.  Abdominal:     General: There is  no distension.     Palpations: Abdomen is soft.     Tenderness: There is no abdominal tenderness.  Musculoskeletal:        General: No tenderness.     Cervical back: Normal range of motion and neck supple.     Comments: Bilateral lower extremity edema 1+  Skin:    General: Skin is warm and dry.  Neurological:     Mental Status: She is alert and oriented to person, place, and time.     GCS: GCS eye subscore is 4. GCS verbal subscore is 5. GCS motor subscore is 6.     Cranial Nerves: Cranial nerves 2-12 are intact.     Sensory: Sensation is intact.     Motor: Motor function is intact.     Coordination: Coordination is intact.  Psychiatric:        Behavior: Behavior normal.     (all labs ordered are listed, but only abnormal results are displayed) Labs Reviewed  PRO BRAIN NATRIURETIC PEPTIDE - Abnormal; Notable for the following components:      Result Value   Pro Brain Natriuretic Peptide 1,137.0 (*)    All other components within normal limits  COMPREHENSIVE METABOLIC PANEL WITH GFR - Abnormal; Notable for the following components:   Sodium 133 (*)    Chloride 94 (*)    All other components within normal limits  CBC    EKG: EKG Interpretation Date/Time:  Wednesday June 15 2024 19:49:46 EDT Ventricular Rate:  61 PR Interval:  138 QRS Duration:  106 QT Interval:  452 QTC Calculation: 456 R Axis:   61  Text Interpretation: Sinus rhythm Probable left atrial enlargement Minimal ST depression No significant change since last tracing Confirmed by Emil Share [phone]) on 06/15/2024 8:34:25 PM  Radiology: [facility] Chest Port 1 View Result Date: 06/15/2024 CLINICAL DATA:  Hypertension, lower extremity edema EXAM: PORTABLE CHEST 1 VIEW COMPARISON:  07/17/2023 FINDINGS: Single frontal view of the chest demonstrates an enlarged cardiac silhouette. No acute airspace disease, effusion, or pneumothorax. No acute bony abnormalities. IMPRESSION: 1. Enlarged cardiac silhouette. 2. No acute  airspace disease. Electronically Signed   By: Ozell Daring M.D.   On: 06/15/2024 20:21     Procedures   Medications Ordered in the ED - No data to display                                  Medical Decision Making Amount and/or  Complexity of Data Reviewed Labs: ordered. Radiology: ordered.   78 yo F with a chief complaints of not feeling well and high blood pressure and leg swelling.  It sounds like the leg swelling has been ongoing.  Has seen her doctor for this previously had been prescribed Lasix  to use as needed.  Felt may be worse today.  Patient also complaining of not feeling well.  Has trouble describing this.  No headaches no chest pain no difficulty breathing.  Has a benign neurologic exam here.  Patient is also been coughing.  Has a chronic cough but much worse over the past couple days.  No fevers.  She took 1 dose of Mucinex  a couple days ago.  Otherwise denies any new medications.  Will obtain laboratory test.  Observe in the ED.  Reassess.  Chest x-ray with enlarged heart size without any obvious pulmonary findings on my independent interpretation.  Patient's BNP is in an indeterminate range.  Hyponatremia hypochloremia.  I discussed results with patient and friend.  Comfortable going home at this time.  Will have her follow-up with cardiology.  PCP.  9:34 PM:  I have discussed the diagnosis/risks/treatment options with the patient and family.  Evaluation and diagnostic testing in the emergency department does not suggest an emergent condition requiring admission or immediate intervention beyond what has been performed at this time.  They will follow up with PCP. We also discussed returning to the ED immediately if new or worsening sx occur. We discussed the sx which are most concerning (e.g., sudden worsening pain, fever, inability to tolerate by mouth, chest pain, stroke s/x) that necessitate immediate return. Medications administered to the patient during their visit  and any new prescriptions provided to the patient are listed below.  Medications given during this visit Medications - No data to display   The patient appears reasonably screen and/or stabilized for discharge and I doubt any other medical condition or other [facility] requiring further screening, evaluation, or treatment in the ED at this time prior to discharge.       Final diagnoses:  Leg swelling    ED Discharge Orders          Ordered    Ambulatory referral to Cardiology       Comments: If you have not heard from the Cardiology office within the next 72 hours please call [phone].   06/15/24 2109               Emil Share, DO 06/15/24 2134

## 2024-06-15 NOTE — ED Triage Notes (Signed)
 Pt reports elevated BP (220/108) and bilateral leg swelling. Pt denies any hx of CHF. Pt denies any SOB.

## 2024-06-16 ENCOUNTER — Telehealth: Payer: Self-pay | Admitting: Cardiology

## 2024-06-16 NOTE — Telephone Encounter (Signed)
 Spoke with pt, she was in the ER last night and her bp started at 220/110 and she left with a blood pressure of 199/99. Her blood pressure currently in 170/92 and that is after taking furosemide  20 mg. She is currently taking nebivolol  and hydrochlorothiazide . She is not using prn hydralazine  and reports the losartan  was stopped by her PCP because her blood pressure got too low. Follow up scheduled Wednesday next week with [facility] but she would like thte message sent to dr ladona to see if any medication changes need to be made prior to the appointment. Will forward to dr ladona for recommendations.

## 2024-06-16 NOTE — Telephone Encounter (Signed)
 Pt c/o BP issue: STAT if pt c/o blurred vision, one-sided weakness or slurred speech  1. What are your last 5 BP readings? 200/100  2. Are you having any other symptoms (ex. Dizziness, headache, blurred vision, passed out)? headache  3. What is your BP issue? No   Patient was seen in the ER at [facility]. They put in a referral for leg swelling. Next available spot was on 11/3 with [place] but patient didn't want to wait that long. Please advise

## 2024-06-17 DIAGNOSIS — I10 Essential (primary) hypertension: Secondary | ICD-10-CM | POA: Diagnosis not present

## 2024-06-17 DIAGNOSIS — R600 Localized edema: Secondary | ICD-10-CM | POA: Diagnosis not present

## 2024-06-17 DIAGNOSIS — I499 Cardiac arrhythmia, unspecified: Secondary | ICD-10-CM | POA: Diagnosis not present

## 2024-06-17 DIAGNOSIS — E669 Obesity, unspecified: Secondary | ICD-10-CM | POA: Diagnosis not present

## 2024-06-17 MED ORDER — LOSARTAN POTASSIUM 25 MG PO TABS: 25.0000 mg | ORAL_TABLET | Freq: Every day | ORAL | 0 refills | Status: DC

## 2024-06-17 NOTE — Telephone Encounter (Signed)
 Spoke with pt regarding Dr. Godfrey suggestions. Pt asked that a prescription be sent to the pharmacy. Pt requested less than 30 pills be sent to the pharmacy. 15 tablets ordered. Pt verbalized understanding. All questions if any were answered.

## 2024-06-20 ENCOUNTER — Encounter: Payer: Self-pay | Admitting: Internal Medicine

## 2024-06-20 ENCOUNTER — Other Ambulatory Visit: Payer: Self-pay | Admitting: Internal Medicine

## 2024-06-20 DIAGNOSIS — R10A0 Flank pain, unspecified side: Secondary | ICD-10-CM

## 2024-06-21 ENCOUNTER — Inpatient Hospital Stay: Admission: RE | Admit: 2024-06-21 | Discharge: 2024-06-21 | Attending: Internal Medicine | Admitting: Internal Medicine

## 2024-06-21 DIAGNOSIS — K5730 Diverticulosis of large intestine without perforation or abscess without bleeding: Secondary | ICD-10-CM | POA: Diagnosis not present

## 2024-06-21 DIAGNOSIS — R10A0 Flank pain, unspecified side: Secondary | ICD-10-CM

## 2024-06-21 MED ORDER — IOPAMIDOL (ISOVUE-300) INJECTION 61%
100.0000 mL | Freq: Once | INTRAVENOUS | Status: AC | PRN
  Administered 2024-06-21: 100 mL via INTRAVENOUS

## 2024-06-22 ENCOUNTER — Ambulatory Visit: Attending: Physician Assistant | Admitting: Physician Assistant

## 2024-06-22 ENCOUNTER — Ambulatory Visit: Admitting: Nurse Practitioner

## 2024-06-22 ENCOUNTER — Encounter: Payer: Self-pay | Admitting: Physician Assistant

## 2024-06-22 VITALS — BP 136/78 | HR 60 | Ht 64.0 in | Wt 214.0 lb

## 2024-06-22 DIAGNOSIS — R002 Palpitations: Secondary | ICD-10-CM | POA: Insufficient documentation

## 2024-06-22 DIAGNOSIS — I10 Essential (primary) hypertension: Secondary | ICD-10-CM | POA: Diagnosis not present

## 2024-06-22 DIAGNOSIS — R0989 Other specified symptoms and signs involving the circulatory and respiratory systems: Secondary | ICD-10-CM | POA: Diagnosis not present

## 2024-06-22 DIAGNOSIS — R600 Localized edema: Secondary | ICD-10-CM | POA: Diagnosis not present

## 2024-06-22 NOTE — Patient Instructions (Addendum)
 Medication Instructions:  Your physician recommends that you continue on your current medications as directed. Please refer to the Current Medication list given to you today.  *If you need a refill on your cardiac medications before your next appointment, please call your pharmacy*  Lab Work: NONE If you have labs (blood work) drawn today and your tests are completely normal, you will receive your results only by: MyChart Message (if you have MyChart) OR A paper copy in the mail If you have any lab test that is abnormal or we need to change your treatment, we will call you to review the results.  Testing/Procedures: NONE  Follow-Up: 07/25/24 with Dr Ladona  Other Instructions Check Blood Pressure  11 am and 4 pm daily if Blood Pressure is elevated you can take 1/2-1 tablet (12.5 mg to 25 mg) of Hydralazine 

## 2024-06-22 NOTE — Progress Notes (Addendum)
 Cardiology Office Note:    Date:  06/22/2024   ID:  Chloe, Holmes October 17, 1945, MRN [id number]  PCP:  Onita Rush, MD   [facility] Providers Cardiologist:  Gordy Bergamo, MD     Referring MD: Onita Rush, MD   Chief Complaint  Patient presents with   Follow-up    Labile BP    History of Present Illness:    Chloe Holmes is a 78 y.o. 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  She follows with Dr. Ganji for palpitations and hypertension.  Last seen January 2025 and she self discontinued losartan  and HCTZ due to hypotension.  Palpitations controlled with Bystolic .  She reported lower extremity swelling but had been on her feet more helping a friend after her back surgery.  She had been taking hydrochlorothiazide  intermittently with dietary sodium intake.  She was started on 20 mg Lasix  with resumption of HCTZ.  She presents back today for cardiology follow-up due to hypertension. She was seen in [facility] ER 06/15/24 for BP 191/99. Medications were not adjusted. She had been traveling the weekend before with increased sodium intake.   She presents today for follow up - she has not been taking losartan . She had self-discontinued losartan  earlier this year due to symptomatic hypotension. BP has been controlled in the AM because she takes nebivolol  at night. BP elevated to SBP 150s at 4pm. She also reports SOB but thinks she is holding her breath intermittently. Due to concern for flank pain, she underwent CT A/P that did not show any acute abnormalities.   When she called our office, PRN hydralazine  was recommended.   She becomes symptomatic with BP lower than 110s.    Past Medical History:  Diagnosis Date   Arthritis    Diverticulosis    Dyspnea    At pre-op ,  pt  (a retired Insurance Underwriter) reports past Hx of SOB that she found out was being caused by frequent PVCs, she believes the PVCs were related to her poor tolerance of daily statin medication regimen ; however reports resolution of SOB now that she is only taking statin three times a week and taking bystolic  to manage dysrhythmia.   GERD (gastroesophageal reflux disease)    History of hiatal hernia    HLD (hyperlipidemia)    Hypertension     Past Surgical History:  Procedure Laterality Date   ABDOMINAL HYSTERECTOMY     CERVICAL DISC SURGERY     COLONOSCOPY  2016   DILATION AND CURETTAGE OF UTERUS     ESOPHAGEAL MANOMETRY N/A 10/03/2013   Procedure: ESOPHAGEAL MANOMETRY (EM);  Surgeon: Gordy CHRISTELLA Starch, MD;  Location: [facility];  Service: Gastroenterology;  Laterality: N/A;   EYE SURGERY     bilateral cataracts    ORBITAL FRACTURE SURGERY     TOTAL KNEE ARTHROPLASTY Left 01/27/2019   Procedure: TOTAL KNEE ARTHROPLASTY;  Surgeon: Ernie Cough, MD;  Location: [facility] ORS;  Service: Orthopedics;  Laterality: Left;  70 mins    Current Medications: Current Meds  Medication Sig   acetaminophen  (TYLENOL ) 325 MG tablet Take 1-2 tablets (325-650 mg total) by mouth every 4 (four) hours as needed for mild pain (pain score 1-3).   aspirin  EC 81 MG tablet Take 1 tablet (81 mg total) by mouth daily. Swallow whole.  atorvastatin  (LIPITOR) 10 MG tablet Take 10 mg by mouth 4 (four) times a week.   divalproex  (DEPAKOTE  ER) 500 MG 24 hr tablet Take 1 tablet (500 mg total) by mouth at bedtime.   furosemide  (LASIX ) 20 MG tablet Take 1 tablet (20 mg total) by mouth daily as needed for edema.   hydrALAZINE  (APRESOLINE ) 25 MG tablet Take 1 tablet (25 mg total) by mouth 3 (three) times daily as needed (BP > 140/90 mm Hg).   hydrochlorothiazide  (HYDRODIURIL ) 25 MG tablet Take 1 tablet (25 mg total) by mouth every morning.   LINZESS  290 MCG CAPS capsule Take 1 capsule (290 mcg total) by mouth daily before breakfast. (Patient  taking differently: Take 290 mcg by mouth as needed.)   nebivolol  (BYSTOLIC ) 10 MG tablet Take 1 tablet (10 mg total) by mouth every evening.     Allergies:   Morphine and Pantoprazole    Social History   Socioeconomic History   Marital status: Divorced    Spouse name: Not on file   Number of children: Not on file   Years of education: Not on file   Highest education level: Not on file  Occupational History   Not on file  Tobacco Use   Smoking status: Never   Smokeless tobacco: Never  Vaping Use   Vaping status: Never Used  Substance and Sexual Activity   Alcohol  use: Yes    Alcohol /week: 7.0 - 10.0 standard drinks of alcohol     Types: 7 - 10 Glasses of wine per week    Comment: 3 glassess a day    Drug use: Never   Sexual activity: Not Currently    Birth control/protection: Post-menopausal  Other Topics Concern   Not on file  Social History Narrative   ** Merged History Encounter **       Social Drivers of Health   Financial Resource Strain: Not on file  Food Insecurity: No Food Insecurity (07/06/2023)   Hunger Vital Sign    Worried About Running Out of Food in the Last Year: Never true    Ran Out of Food in the Last Year: Never true  Transportation Needs: No Transportation Needs (07/06/2023)   PRAPARE - Administrator, Civil Service (Medical): No    Lack of Transportation (Non-Medical): No  Physical Activity: Not on file  Stress: Not on file  Social Connections: Not on file     Family History: The patient's family history includes Cancer in her father and mother; Colon cancer in her mother; Heart disease in her father and mother; Hypertension in her father; Lung cancer in her father; Stomach cancer in her paternal grandfather. There is no history of Esophageal cancer, Rectal cancer, or Breast cancer.  ROS:   Please see the history of present illness.     All other systems reviewed and are negative.  EKGs/Labs/Other Studies Reviewed:    The  following studies were reviewed today:       Recent Labs: 07/05/2023: TSH 2.239 07/09/2023: Magnesium  2.0 06/15/2024: ALT 18; BUN 17; Creatinine, Ser 0.78; Hemoglobin 12.7; Platelets 187; Potassium 3.9; Pro Brain Natriuretic Peptide 1,137.0; Sodium 133  Recent Lipid Panel    Component Value Date/Time   CHOL 186 07/05/2023 0406   TRIG 75 07/05/2023 0406   HDL 77 07/05/2023 0406   CHOLHDL 2.4 07/05/2023 0406   VLDL 15 07/05/2023 0406   LDLCALC 94 07/05/2023 0406     Risk Assessment/Calculations:  Physical Exam:    VS:  BP 136/78 (BP Location: Left Arm, Patient Position: Sitting, Cuff Size: Large)   Pulse 60   Ht 5' 4 (1.626 m)   Wt 214 lb (97.1 kg)   SpO2 96%   BMI 36.73 kg/m     Wt Readings from Last 3 Encounters:  06/22/24 214 lb (97.1 kg)  06/15/24 218 lb (98.9 kg)  09/15/23 212 lb 12.8 oz (96.5 kg)     GEN:  Well nourished, well developed in no acute distress HEENT: Normal NECK: No JVD; No carotid bruits LYMPHATICS: No lymphadenopathy CARDIAC: RRR, no murmurs, rubs, gallops RESPIRATORY:  Clear to auscultation without rales, wheezing or rhonchi  ABDOMEN: Soft, non-tender, non-distended MUSCULOSKELETAL:  mild B LE edema with L > R  SKIN: Warm and dry NEUROLOGIC:  Alert and oriented x 3 PSYCHIATRIC:  Normal affect   ASSESSMENT:    1. Primary hypertension   2. Labile blood pressure   3. Palpitations   4. Bilateral leg edema    PLAN:    In order of problems listed above:  Hypertension Labile BP - Continue 10 mg Nebivolol  qPM, 25 mg hydrochlorothiazide  pAM -- she uses lasix  PRN -- had symptomatic hypotension with losartan  - will not resume this -- BP log shows fluctuating BP with some elevation in the afternoon -- recommend to keep more consistent BP log and to use 12.5-25 mg hydralazine  PRN in the afternoons for SBP >170 -- she suspects her BP lability will improve once she can wean off depakote  -- she will take BP at 11AM and  4PM   Palpitations - Continue 10 mg Nebivolol  -- stable   Lower extremity swelling with L > R - Continue 20 mg Lasix  -- no chest pain, SOB, or tachycardia -- unilateral swelling is normal for her following prior TKA   Follow up with Dr. Ladona in Dec as scheduled.            Medication Adjustments/Labs and Tests Ordered: Current medicines are reviewed at length with the patient today.  Concerns regarding medicines are outlined above.  No orders of the defined types were placed in this encounter.  No orders of the defined types were placed in this encounter.   Patient Instructions  Medication Instructions:  Your physician recommends that you continue on your current medications as directed. Please refer to the Current Medication list given to you today.  *If you need a refill on your cardiac medications before your next appointment, please call your pharmacy*  Lab Work: NONE If you have labs (blood work) drawn today and your tests are completely normal, you will receive your results only by: MyChart Message (if you have MyChart) OR A paper copy in the mail If you have any lab test that is abnormal or we need to change your treatment, we will call you to review the results.  Testing/Procedures: NONE  Follow-Up: 07/25/24 with Dr Ladona  Other Instructions Check Blood Pressure  11 am and 4 pm daily if Blood Pressure is elevated you can take 1/2-1 tablet (12.5 mg to 25 mg) of Hydralazine            Signed, Chloe Nat Hails, PA  06/22/2024 2:51 PM    [facility]

## 2024-06-28 DIAGNOSIS — Z23 Encounter for immunization: Secondary | ICD-10-CM | POA: Diagnosis not present

## 2024-07-25 ENCOUNTER — Ambulatory Visit: Attending: Cardiology | Admitting: Cardiology

## 2024-07-25 ENCOUNTER — Encounter: Payer: Self-pay | Admitting: Cardiology

## 2024-07-25 VITALS — BP 131/75 | HR 60 | Ht 64.0 in | Wt 212.0 lb

## 2024-07-25 DIAGNOSIS — I10 Essential (primary) hypertension: Secondary | ICD-10-CM | POA: Diagnosis present

## 2024-07-25 DIAGNOSIS — R0989 Other specified symptoms and signs involving the circulatory and respiratory systems: Secondary | ICD-10-CM | POA: Diagnosis present

## 2024-07-25 NOTE — Progress Notes (Signed)
 Cardiology Office Note:  .   Date:  07/25/2024  ID:  Chloe Holmes, Chloe Holmes 11-Jul-1946, MRN [id number] PCP: Onita Rush, MD  [facility] Providers Cardiologist:  Gordy Bergamo, MD   History of Present Illness: Chloe Holmes   Chloe Holmes is a 78 y.o. 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  She was also seen in the emergency room with markedly elevated blood pressure and again seen in the office.    In view of difficulty in managing her blood pressure issues and patient's sensitivity to multiple medications was made to come in for evaluation.  With losartan  25 mg she had marked improvement in blood pressure but discontinued this in the past as the blood pressure dropped low and patient stated that she is nonfunctional and her blood pressure drops.  Recently evaluated by Jon Hails on 06/22/2024 and started back on losartan  12.5 mg daily but patient has not started this and states her BP goes up when she eats salty food and she had been consuming excess salt few days prior to ED visit with elevated BP. Chloe Holmes    Discussed the use of AI scribe software for clinical note transcription with the patient, who gave verbal consent to proceed.  History of Present Illness Chloe Holmes is a 78 year old female with hypertension who presents with blood pressure management concerns.  She takes Bystolic  at night and is prescribed losartan  12.5 mg, which she does not take regularly. She has hypotensive episodes two to three times a week lasting three to four hours, with dizziness that improves when she sits and drinks water. Her home blood pressure varies widely, at times up to  220/110 mmHg, especially after salty foods or prolonged sitting such as after long drives, including a noted spike after a trip to [place] with high salt intake.  She was hospitalized a year ago for a suspected stroke and was started on Depakote . She has gained about 22 pounds since starting Depakote  and believes this contributes to her blood pressure fluctuations. She notes lower extremity edema, worse in the left leg, which improves with leg elevation at night.  She reports that her blood pressure can differ markedly when checked minutes apart and often decreases on a second reading. She is generally satisfied when her blood pressure is not too low but is worried about dizziness and intermittent very high readings.  Cardiac Studies relevent.    ECHOCARDIOGRAM COMPLETE 07/09/2023  1. Left ventricular ejection fraction, by estimation, is 55 to 60%. The left ventricle has normal function. The left ventricle has no regional wall motion abnormalities. There is mild left ventricular hypertrophy. Left ventricular diastolic parameters are consistent with Grade I diastolic dysfunction (impaired relaxation).  Lexiscan  nuclear stress test 06/02/2018: No evidence of ischemia, normal LVEF at 63%.  Labs   Lab Results  Component Value Date   CHOL 186 07/05/2023   HDL 77 07/05/2023   LDLCALC 94 07/05/2023   TRIG 75 07/05/2023   CHOLHDL 2.4 07/05/2023   No results found for: LIPOA  Recent Labs    06/15/24 2028  NA 133*  K 3.9  CL 94*  CO2 27  GLUCOSE 91  BUN 17  CREATININE 0.78  CALCIUM  9.3  GFRNONAA >60  Lab Results  Component Value Date   ALT 18 06/15/2024   AST 28 06/15/2024   ALKPHOS 76 06/15/2024   BILITOT 0.3 06/15/2024      Latest Ref Rng & Units 06/15/2024    8:26 PM 07/17/2023   10:59 AM 07/13/2023    4:59 AM  CBC  WBC 4.0 - 10.5 K/uL 6.3  5.6  4.7   Hemoglobin 12.0 - 15.0 g/dL 87.2  86.9  87.6   Hematocrit 36.0 - 46.0 % 36.5  40.0  37.3   Platelets 150 - 400 K/uL 187   219  196    Lab Results  Component Value Date   HGBA1C 5.2 07/05/2023    Lab Results  Component Value Date   TSH 2.239 07/05/2023     ROS  Review of Systems  Cardiovascular:  Negative for chest pain, dyspnea on exertion and leg swelling.   Physical Exam:   VS:  BP 131/75   Pulse 60   Ht 5' 4 (1.626 m)   Wt 212 lb (96.2 kg)   SpO2 98%   BMI 36.39 kg/m    Wt Readings from Last 3 Encounters:  07/25/24 212 lb (96.2 kg)  06/22/24 214 lb (97.1 kg)  06/15/24 218 lb (98.9 kg)    BP Readings from Last 3 Encounters:  07/25/24 131/75  06/22/24 136/78  06/15/24 (!) 184/75   Physical Exam Constitutional:      Appearance: She is obese.  Neck:     Vascular: No carotid bruit or JVD.  Cardiovascular:     Rate and Rhythm: Normal rate and regular rhythm.     Pulses: Intact distal pulses.     Heart sounds: Normal heart sounds. No murmur heard.    No gallop.  Pulmonary:     Effort: Pulmonary effort is normal.     Breath sounds: Normal breath sounds.  Abdominal:     General: Bowel sounds are normal.     Palpations: Abdomen is soft.  Musculoskeletal:     Right lower leg: No edema.     Left lower leg: No edema.    EKG:         ASSESSMENT AND PLAN: .      ICD-10-CM   1. Primary hypertension  I10     2. Labile blood pressure  R09.89       Assessment & Plan Labile hypertension Episodes of hypotension causing dizziness 2-3 times weekly, lasting 3-4 hours. Blood pressure fluctuates significantly, with occasional spikes to 220/110 mmHg, particularly after dietary indiscretions and lack of hydration. Current management includes Bystolic  10 mg at night. Weight gain of 22 pounds may contribute to blood pressure fluctuations. She prefers to manage blood pressure without additional medication for hypotension. Discussed the importance of maintaining steady blood pressure and the risks of both hypertension and hypotension. Advised against eating salty food but acknowledged the  temptation and provided a contingency plan. - Continue Bystolic  10 mg at night. - Take an extra dose of Bystolic  if consuming salty food or experiencing elevated blood pressure, however advised her against consuming excess salt and also excess calories. - Use hydralazine  if systolic blood pressure exceeds 160 mmHg. - Elevate legs at night to manage edema.  Follow up: PRN Signed,  Gordy Bergamo, MD, [facility] 07/25/2024, 1:54 PM [facility] [address] [place], [place] [address] Phone: [phone]. Fax:  [phone]

## 2024-07-25 NOTE — Patient Instructions (Signed)
 Medication Instructions:  Your physician recommends that you continue on your current medications as directed. Please refer to the Current Medication list given to you today.  *If you need a refill on your cardiac medications before your next appointment, please call your pharmacy*  Lab Work: NONE If you have labs (blood work) drawn today and your tests are completely normal, you will receive your results only by: MyChart Message (if you have MyChart) OR A paper copy in the mail If you have any lab test that is abnormal or we need to change your treatment, we will call you to review the results.  Testing/Procedures: NONE  Follow-Up: At [facility], you and your health needs are our priority.  As part of our continuing mission to provide you with exceptional heart care, our providers are all part of one team.  This team includes your primary Cardiologist (physician) and Advanced Practice Providers or APPs (Physician Assistants and Nurse Practitioners) who all work together to provide you with the care you need, when you need it.  Your next appointment:   As needed  Provider:   Ladona, MD  We recommend signing up for the patient portal called MyChart.  Sign up information is provided on this After Visit Summary.  MyChart is used to connect with patients for Virtual Visits (Telemedicine).  Patients are able to view lab/test results, encounter notes, upcoming appointments, etc.  Non-urgent messages can be sent to your provider as well.   To learn more about what you can do with MyChart, go to [email or web address].

## 2024-07-27 DIAGNOSIS — L820 Inflamed seborrheic keratosis: Secondary | ICD-10-CM | POA: Diagnosis not present

## 2024-07-27 DIAGNOSIS — L570 Actinic keratosis: Secondary | ICD-10-CM | POA: Diagnosis not present

## 2024-07-27 DIAGNOSIS — L821 Other seborrheic keratosis: Secondary | ICD-10-CM | POA: Diagnosis not present

## 2024-07-27 DIAGNOSIS — L84 Corns and callosities: Secondary | ICD-10-CM | POA: Diagnosis not present

## 2024-07-27 DIAGNOSIS — D1801 Hemangioma of skin and subcutaneous tissue: Secondary | ICD-10-CM | POA: Diagnosis not present

## 2024-07-27 DIAGNOSIS — L814 Other melanin hyperpigmentation: Secondary | ICD-10-CM | POA: Diagnosis not present

## 2024-08-01 DIAGNOSIS — I10 Essential (primary) hypertension: Secondary | ICD-10-CM | POA: Diagnosis not present

## 2024-08-01 DIAGNOSIS — Z79899 Other long term (current) drug therapy: Secondary | ICD-10-CM | POA: Diagnosis not present

## 2024-08-03 ENCOUNTER — Ambulatory Visit: Payer: Medicare Other | Admitting: Neurology

## 2024-08-03 ENCOUNTER — Encounter: Payer: Self-pay | Admitting: Neurology

## 2024-08-03 VITALS — BP 166/84 | HR 58 | Ht 64.0 in | Wt 220.0 lb

## 2024-08-03 DIAGNOSIS — R569 Unspecified convulsions: Secondary | ICD-10-CM | POA: Diagnosis not present

## 2024-08-03 MED ORDER — DIVALPROEX SODIUM ER 500 MG PO TB24: 500.0000 mg | ORAL_TABLET | Freq: Every evening | ORAL | 0 refills | Status: DC

## 2024-08-03 NOTE — Progress Notes (Signed)
 [facility]  PATIENT: Chloe Holmes DOB: March 11, 1946  REQUESTING CLINICIAN: Onita Rush, MD HISTORY FROM: Patient/Chart review  REASON FOR VISIT: Seizures/Stroke    HISTORICAL  CHIEF COMPLAINT:  Chief Complaint  Patient presents with   RM 12    Seizure-like activity;     INTERVAL HISTORY 08/03/2024 Patient presents for follow-up, she is alone.  Last visit was a year ago.  Since then she has been doing well on Depakote  XR 500 mg daily, denies any seizure or seizure activity.  Her last Depakote  level earlier this month was 38.  She has resumed driving denies any difficulty.  No other complaints, no other concerns.  She tells me that she still feels she did not have a seizure.  She might had a concussion from hitting her head causing aphasia.  With the Depakote  she does have side effect of weight gain and hair loss.   HISTORY OF PRESENT ILLNESS:  This is a 78 year old woman past medical history hypertension, hyperlipidemia, GERD who is presenting after being admitted to the hospital on October 10 for an episode of acute confusion, and aphasia.  In the hospital, her initial MRI did not show any acute abnormality and EEG showed left hemispheric slowing.  She was worked up also for encephalitis which was negative.  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  She was initially started on Keppra  for possible seizures, but she did have side effect of hallucinations, Keppra  switched to Depakote .  She completed acute rehab and meet all goals.  Currently she does not have any deficits from the stroke.  She is compliant with the Depakote  but does reports side effect of somnolence.  She denies any previous history of seizures, denies any seizure risk factors. Currently she is getting physical therapy but for her chronic back pain that she anterior over the summer.   Hospital course and Summary  Chloe Holmes is a 78 y.o. female who presented to  the emergency department on 07/05/2023 after a fall at home and hitting her head. She remembers the fall stating she tripped over her feet while trying to retrieve her phone that she had dropped. She was able to call EMS via her smart watch and she was found wandering in her backyard. Bruising was evident on her nose and right hand and she was presenting with word salad. No witnesses. She lives alone. She was brought in as code stroke and initial MRI was negative for acute finding. CT head was negative for acute large territory infarct or hypodensity however right orbital floor fracture was noted. CTA head and neck without LVO. cEEG performed. LTM with continuous slow, left hemisphere, maximal left temporal region. Due to these findings, neurology felt her event was likely due to a seizure. Keppra  load was initiated. ENT and ophthalmology were consulted for right orbital floor fracture. While history was limited due to the patient's aphasia, there were no signs of entrapment and no intervention. She developed visual hallucinations on 11-12 and repeat CT head was performed. Keppra  dose was increased and repeat EEG was performed 11/13. She continued to endorse intermittent double vision and hallucinations of what appeared to be small blood vessels or branches on the wall appearing as wallpaper. Repeat MRI with and without contrast obtained. Keppra  was discontinued due to possible side effect of visual hallucinations. She was started on Depakote . Aspirin  and Plavix  were started for 3 weeks then aspirin  alone planned. Lovenox  started for DVT prophylaxis.  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 Hemoglobin A1c is 5.2%. CBC and CMP within normal limits. 2D echo with estimated left ventricular ejection fraction of 55 to 60%. Left ventricular diastolic parameters are consistent with grade 1 diastolic dysfunction. She has no signs or symptoms of heart failure. Heart healthy diet with thin  liquids. She is requiring min assist for safe transfers and gait with rollator.      Hospital Course: Chloe Holmes was admitted to rehab 07/09/2023 for inpatient therapies to consist of PT, ST and OT at least three hours five days a week. Past admission physiatrist, therapy team and rehab RN have worked together to provide customized collaborative inpatient rehab.    Blood pressures were monitored on TID basis and home medicines held.  Hydralazine  10 mg every 8 hours for elevated blood pressure per parameters added.  Bystolic  restarted 2.5 mg daily on 11/15.  EEG on 11/14 with left cortical dysfunction but otherwise unremarkable.  This was discussed with the patient regarding seizure treatment.  Neurology feels she will need to be on prophylaxis lifelong.  She reported persistent nausea associated with Depakote .  Valproic  acid levels checked as well as LFTs.  Patient was seen by Dr. Luciano for ENT follow-up on 11/17. Labs stable/LFTS within normal limits 11/18.    Rehab course: During patient's stay in rehab weekly team conferences were held to monitor patient's progress, set goals and discuss barriers to discharge. At admission, patient required supervision with basic self-care skills and min assist with mobility.   She has had improvement in activity tolerance, balance, postural control as well as ability to compensate for deficits. She has had improvement in functional use RUE/LUE  and RLE/LLE as well as improvement in awareness Patient has met 11 of 11 long term goals due to improved activity tolerance, improved balance, ability to compensate for deficits, and improved coordination.  Patient to discharge at overall Modified Independent level.  Patient's care partner is independent to provide the necessary physical assistance at discharge with driving and running errands.  OTHER MEDICAL CONDITIONS: Hypertension, Hyperlipidemia, GERD   REVIEW OF SYSTEMS: Full 14 system review of systems  performed and negative with exception of: As noted in the HPI   ALLERGIES: Allergies  Allergen Reactions   Morphine Other (See Comments)    Hypotension; had to be narcan'd . This was 40 years ago   Pantoprazole  Cough    HOME MEDICATIONS: Outpatient Medications Prior to Visit  Medication Sig Dispense Refill   acetaminophen  (TYLENOL ) 325 MG tablet Take 1-2 tablets (325-650 mg total) by mouth every 4 (four) hours as needed for mild pain (pain score 1-3).     aspirin  EC 81 MG tablet Take 1 tablet (81 mg total) by mouth daily. Swallow whole. 30 tablet 0   atorvastatin  (LIPITOR) 10 MG tablet Take 10 mg by mouth 4 (four) times a week.     celecoxib  (CELEBREX ) 100 MG capsule Take 100 mg by mouth daily.     furosemide  (LASIX ) 20 MG tablet Take 1 tablet (20 mg total) by mouth daily as needed for edema. 30 tablet 0   hydrALAZINE  (APRESOLINE ) 25 MG tablet Take 1 tablet (25 mg total) by mouth 3 (three) times daily as needed (BP > 140/90 mm Hg). 90 tablet 2   hydrochlorothiazide  (HYDRODIURIL ) 25 MG tablet Take 1 tablet (25 mg total) by mouth every morning.     LINZESS  290 MCG CAPS capsule Take 1 capsule (290 mcg total) by mouth  daily before breakfast. (Patient taking differently: Take 290 mcg by mouth as needed.) 30 capsule 3   nebivolol  (BYSTOLIC ) 10 MG tablet Take 1 tablet (10 mg total) by mouth every evening. 90 tablet 0   divalproex  (DEPAKOTE  ER) 500 MG 24 hr tablet Take 1 tablet (500 mg total) by mouth at bedtime. 90 tablet 3   esomeprazole (NEXIUM) 40 MG capsule Take 40 mg by mouth daily before breakfast.     No facility-administered medications prior to visit.    PAST MEDICAL HISTORY: Past Medical History:  Diagnosis Date   Arthritis    Diverticulosis    Dyspnea    At pre-op , pt  (a retired Insurance Underwriter) reports past Hx of SOB that she found out was being caused by frequent PVCs, she believes the PVCs were related to her poor tolerance of daily statin medication regimen ; however reports  resolution of SOB now that she is only taking statin three times a week and taking bystolic  to manage dysrhythmia.   GERD (gastroesophageal reflux disease)    History of hiatal hernia    HLD (hyperlipidemia)    Hypertension     PAST SURGICAL HISTORY: Past Surgical History:  Procedure Laterality Date   ABDOMINAL HYSTERECTOMY     CERVICAL DISC SURGERY     COLONOSCOPY  2016   DILATION AND CURETTAGE OF UTERUS     ESOPHAGEAL MANOMETRY N/A 10/03/2013   Procedure: ESOPHAGEAL MANOMETRY (EM);  Surgeon: Gordy CHRISTELLA Starch, MD;  Location: [facility];  Service: Gastroenterology;  Laterality: N/A;   EYE SURGERY     bilateral cataracts    ORBITAL FRACTURE SURGERY     TOTAL KNEE ARTHROPLASTY Left 01/27/2019   Procedure: TOTAL KNEE ARTHROPLASTY;  Surgeon: Ernie Cough, MD;  Location: [facility];  Service: Orthopedics;  Laterality: Left;  70 mins    FAMILY HISTORY: Family History  Problem Relation Age of Onset   Heart disease Mother    Cancer Mother        colon   Colon cancer Mother    Hypertension Father    Heart disease Father    Lung cancer Father    Cancer Father        lung   Stomach cancer Paternal Grandfather    Esophageal cancer Neg Hx    Rectal cancer Neg Hx    Breast cancer Neg Hx     SOCIAL HISTORY: Social History   Socioeconomic History   Marital status: Divorced    Spouse name: Not on file   Number of children: Not on file   Years of education: Not on file   Highest education level: Not on file  Occupational History   Not on file  Tobacco Use   Smoking status: Never   Smokeless tobacco: Never  Vaping Use   Vaping status: Never Used  Substance and Sexual Activity   Alcohol  use: Yes    Alcohol /week: 7.0 - 10.0 standard drinks of alcohol     Types: 7 - 10 Glasses of wine per week    Comment: 3 glassess a day    Drug use: Never   Sexual activity: Not Currently    Birth control/protection: Post-menopausal  Other Topics Concern   Not on file  Social History Narrative    ** Merged History Encounter **       Social Drivers of Health   Financial Resource Strain: Not on file  Food Insecurity: No Food Insecurity (07/06/2023)   Hunger Vital Sign    Worried About  Running Out of Food in the Last Year: Never true    Ran Out of Food in the Last Year: Never true  Transportation Needs: No Transportation Needs (07/06/2023)   PRAPARE - Administrator, Civil Service (Medical): No    Lack of Transportation (Non-Medical): No  Physical Activity: Not on file  Stress: Not on file  Social Connections: Not on file  Intimate Partner Violence: Not At Risk (07/06/2023)   Humiliation, Afraid, Rape, and Kick questionnaire    Fear of Current or Ex-Partner: No    Emotionally Abused: No    Physically Abused: No    Sexually Abused: No    PHYSICAL EXAM  GENERAL EXAM/CONSTITUTIONAL: Vitals:  Vitals:   08/03/24 1005  BP: (!) 166/84  Pulse: (!) 58  Weight: 220 lb (99.8 kg)  Height: 5' 4 (1.626 m)   Body mass index is 37.76 kg/m. Wt Readings from Last 3 Encounters:  08/03/24 220 lb (99.8 kg)  07/25/24 212 lb (96.2 kg)  06/22/24 214 lb (97.1 kg)   Patient is in no distress; well developed, nourished and groomed; neck is supple  MUSCULOSKELETAL: Gait, strength, tone, movements noted in Neurologic exam below  NEUROLOGIC: MENTAL STATUS:      No data to display         awake, alert, oriented to person, place and time recent and remote memory intact normal attention and concentration language fluent, comprehension intact, naming intact fund of knowledge appropriate  CRANIAL NERVE:  2nd, 3rd, 4th, 6th - Visual fields full to confrontation, extraocular muscles intact, no nystagmus 5th - facial sensation symmetric 7th - facial strength symmetric 8th - hearing intact 9th - palate elevates symmetrically, uvula midline 11th - shoulder shrug symmetric 12th - tongue protrusion midline  MOTOR:  normal bulk and tone, full strength in the BUE,  BLE  SENSORY:  normal and symmetric to light touch  COORDINATION:  finger-nose-finger, fine finger movements normal  GAIT/STATION:  normal   DIAGNOSTIC DATA (LABS, IMAGING, TESTING) - I reviewed patient records, labs, notes, testing and imaging myself where available.  Lab Results  Component Value Date   WBC 6.3 06/15/2024   HGB 12.7 06/15/2024   HCT 36.5 06/15/2024   MCV 89.5 06/15/2024   PLT 187 06/15/2024      Component Value Date/Time   NA 133 (L) 06/15/2024 2028   K 3.9 06/15/2024 2028   CL 94 (L) 06/15/2024 2028   CO2 27 06/15/2024 2028   GLUCOSE 91 06/15/2024 2028   BUN 17 06/15/2024 2028   CREATININE 0.78 06/15/2024 2028   CALCIUM  9.3 06/15/2024 2028   PROT 6.8 06/15/2024 2028   ALBUMIN 4.4 06/15/2024 2028   AST 28 06/15/2024 2028   ALT 18 06/15/2024 2028   ALKPHOS 76 06/15/2024 2028   BILITOT 0.3 06/15/2024 2028   GFRNONAA >60 06/15/2024 2028   GFRAA >60 01/28/2019 0423   Lab Results  Component Value Date   CHOL 186 07/05/2023   HDL 77 07/05/2023   LDLCALC 94 07/05/2023   TRIG 75 07/05/2023   CHOLHDL 2.4 07/05/2023   Lab Results  Component Value Date   HGBA1C 5.2 07/05/2023   No results found for: VITAMINB12 Lab Results  Component Value Date   TSH 2.239 07/05/2023    EEG 07/09/2023 Intermittent slow, left temporal region   MRI Brain 07/08/2023 1. Punctate (1 mm) acute infarction affecting the left parietal cortex. As an isolated finding, this could be incidental. 2. Chronic small-vessel ischemic changes of the  pons and cerebral hemispheric white matter. 3. Known orbital blowout fracture of the orbital floor on the right. 4. No imaging finding to suggest the diagnosis of meningitis.    ASSESSMENT AND PLAN  78 y.o. year old female with history of hypertension, hyperlipidemia, GERD who is presenting after for follow-up.  She has been well in the past year, denies any seizure or seizure activity.  She is compliant with Depakote  XR 500 mg  daily but does have side effect of weight gain and hair loss.  Again she feels she did not have a seizure, her symptoms might of due to concussion from hitting her head or that punctate left parietal stroke.  Plan will be to repeat the routine EEG and if normal will discontinue Depakote  but if the EEG remains abnormal we will switch Depakote  Depakote  to zonisamide.  Patient understand while off the medication she may had a seizure and she is comfortable with plans.  I will contact her after the completion of the EEG otherwise I will see her in a year or sooner if worse.   1. Seizure-like activity (HCC)      Patient Instructions  Continue with Depakote  XR 500 mg daily for now Repeat routine EEG, if normal will discontinue Depakote  and if abnormal will switch Depakote  to Zonisamide Continue your other medications Continue follow-up with PCP Return 1 year or sooner if worse.  Orders Placed This Encounter  Procedures   EEG adult    Meds ordered this encounter  Medications   divalproex  (DEPAKOTE  ER) 500 MG 24 hr tablet    Sig: Take 1 tablet (500 mg total) by mouth at bedtime.    Dispense:  30 tablet    Refill:  0    Return if symptoms worsen or fail to improve.    Pastor Falling, MD 08/03/2024, 10:35 AM  [facility] [address], [address] [place], [place] [address] [phone]

## 2024-08-03 NOTE — Patient Instructions (Signed)
 Continue with Depakote  XR 500 mg daily for now Repeat routine EEG, if normal will discontinue Depakote  and if abnormal will switch Depakote  to Zonisamide Continue your other medications Continue follow-up with PCP Return 1 year or sooner if worse.

## 2024-08-09 ENCOUNTER — Ambulatory Visit: Admitting: *Deleted

## 2024-08-09 ENCOUNTER — Other Ambulatory Visit: Payer: Self-pay | Admitting: Neurology

## 2024-08-09 ENCOUNTER — Ambulatory Visit: Payer: Self-pay | Admitting: Neurology

## 2024-08-09 DIAGNOSIS — R569 Unspecified convulsions: Secondary | ICD-10-CM

## 2024-08-09 NOTE — Procedures (Signed)
° ° °  History:  78 year old woman with seizure like activity   EEG classification: Awake and drowsy  Duration: 26 minutes   Technical aspects: This EEG study was done with scalp electrodes positioned according to the 10-20 International system of electrode placement. Electrical activity was reviewed with band pass filter of 1-70Hz , sensitivity of 7 uV/mm, display speed of 72mm/sec with a 60Hz  notched filter applied as appropriate. EEG data were recorded continuously and digitally stored.   Description of the recording: The background rhythms of this recording consists of a fairly well modulated medium amplitude alpha rhythm of 9 Hz that is reactive to eye opening and closure. Present in the anterior head region is a 15-20 Hz beta activity. Photic stimulation was performed, did not show any abnormalities. Hyperventilation was also performed, did not show any abnormalities. Drowsiness was manifested by background fragmentation. No abnormal epileptiform discharges seen during this recording. There was no focal slowing. There were no electrographic seizure identified.   Abnormality: None   Impression: This is a normal awake and drowsy EEG. No evidence of interictal epileptiform discharges. Normal EEGs, however, do not rule out epilepsy.    Ernie Sagrero, MD [facility]

## 2025-08-09 ENCOUNTER — Ambulatory Visit: Admitting: Neurology
